# Patient Record
Sex: Female | Born: 1980 | Race: White | Hispanic: No | Marital: Married | Smoking: Former smoker
Health system: Southern US, Community
[De-identification: ages and names within clinical notes are randomized; demographics above are authoritative.]

## PROBLEM LIST (undated history)

## (undated) ENCOUNTER — Inpatient Hospital Stay (HOSPITAL_COMMUNITY): Payer: Self-pay

## (undated) DIAGNOSIS — E785 Hyperlipidemia, unspecified: Secondary | ICD-10-CM

## (undated) DIAGNOSIS — F329 Major depressive disorder, single episode, unspecified: Secondary | ICD-10-CM

## (undated) DIAGNOSIS — B009 Herpesviral infection, unspecified: Secondary | ICD-10-CM

## (undated) DIAGNOSIS — O99345 Other mental disorders complicating the puerperium: Secondary | ICD-10-CM

## (undated) DIAGNOSIS — O26899 Other specified pregnancy related conditions, unspecified trimester: Secondary | ICD-10-CM

## (undated) DIAGNOSIS — T8859XA Other complications of anesthesia, initial encounter: Secondary | ICD-10-CM

## (undated) DIAGNOSIS — N189 Chronic kidney disease, unspecified: Secondary | ICD-10-CM

## (undated) DIAGNOSIS — K219 Gastro-esophageal reflux disease without esophagitis: Secondary | ICD-10-CM

## (undated) DIAGNOSIS — F319 Bipolar disorder, unspecified: Secondary | ICD-10-CM

## (undated) DIAGNOSIS — M5136 Other intervertebral disc degeneration, lumbar region: Secondary | ICD-10-CM

## (undated) DIAGNOSIS — O24419 Gestational diabetes mellitus in pregnancy, unspecified control: Secondary | ICD-10-CM

## (undated) DIAGNOSIS — M419 Scoliosis, unspecified: Secondary | ICD-10-CM

## (undated) DIAGNOSIS — I10 Essential (primary) hypertension: Secondary | ICD-10-CM

## (undated) DIAGNOSIS — N200 Calculus of kidney: Secondary | ICD-10-CM

## (undated) DIAGNOSIS — Z8669 Personal history of other diseases of the nervous system and sense organs: Secondary | ICD-10-CM

## (undated) DIAGNOSIS — C539 Malignant neoplasm of cervix uteri, unspecified: Secondary | ICD-10-CM

## (undated) DIAGNOSIS — M779 Enthesopathy, unspecified: Secondary | ICD-10-CM

## (undated) DIAGNOSIS — M5430 Sciatica, unspecified side: Secondary | ICD-10-CM

## (undated) DIAGNOSIS — N83209 Unspecified ovarian cyst, unspecified side: Secondary | ICD-10-CM

## (undated) DIAGNOSIS — O139 Gestational [pregnancy-induced] hypertension without significant proteinuria, unspecified trimester: Secondary | ICD-10-CM

## (undated) DIAGNOSIS — F419 Anxiety disorder, unspecified: Secondary | ICD-10-CM

## (undated) DIAGNOSIS — R12 Heartburn: Secondary | ICD-10-CM

## (undated) DIAGNOSIS — IMO0002 Reserved for concepts with insufficient information to code with codable children: Secondary | ICD-10-CM

## (undated) DIAGNOSIS — H9190 Unspecified hearing loss, unspecified ear: Secondary | ICD-10-CM

## (undated) DIAGNOSIS — R87619 Unspecified abnormal cytological findings in specimens from cervix uteri: Secondary | ICD-10-CM

## (undated) DIAGNOSIS — F53 Postpartum depression: Secondary | ICD-10-CM

## (undated) DIAGNOSIS — H547 Unspecified visual loss: Secondary | ICD-10-CM

## (undated) DIAGNOSIS — C801 Malignant (primary) neoplasm, unspecified: Secondary | ICD-10-CM

## (undated) DIAGNOSIS — M51369 Other intervertebral disc degeneration, lumbar region without mention of lumbar back pain or lower extremity pain: Secondary | ICD-10-CM

## (undated) DIAGNOSIS — G4700 Insomnia, unspecified: Secondary | ICD-10-CM

## (undated) HISTORY — DX: Calculus of kidney: N20.0

## (undated) HISTORY — DX: Essential (primary) hypertension: I10

## (undated) HISTORY — DX: Postpartum depression: F53.0

## (undated) HISTORY — DX: Gestational diabetes mellitus in pregnancy, unspecified control: O24.419

## (undated) HISTORY — PX: LEEP: SHX91

## (undated) HISTORY — PX: MOUTH SURGERY: SHX715

## (undated) HISTORY — PX: TYMPANOSTOMY TUBE PLACEMENT: SHX32

## (undated) HISTORY — DX: Other mental disorders complicating the puerperium: O99.345

## (undated) HISTORY — DX: Unspecified visual loss: H54.7

---
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
## 2003-01-25 DIAGNOSIS — C539 Malignant neoplasm of cervix uteri, unspecified: Secondary | ICD-10-CM

## 2003-01-25 HISTORY — DX: Malignant neoplasm of cervix uteri, unspecified: C53.9

## 2009-04-09 ENCOUNTER — Emergency Department (HOSPITAL_COMMUNITY): Admission: EM | Admit: 2009-04-09 | Discharge: 2009-04-09 | Payer: Self-pay | Admitting: Emergency Medicine

## 2010-04-19 LAB — POCT PREGNANCY, URINE: Preg Test, Ur: NEGATIVE

## 2010-09-16 ENCOUNTER — Emergency Department (HOSPITAL_COMMUNITY): Payer: Self-pay

## 2010-09-16 ENCOUNTER — Emergency Department (HOSPITAL_COMMUNITY)
Admission: EM | Admit: 2010-09-16 | Discharge: 2010-09-16 | Disposition: A | Discharge status: Home / Self Care | Payer: Self-pay | Attending: Emergency Medicine | Admitting: Emergency Medicine

## 2010-09-16 ENCOUNTER — Encounter: Payer: Self-pay | Admitting: Emergency Medicine

## 2010-09-16 DIAGNOSIS — S8253XA Displaced fracture of medial malleolus of unspecified tibia, initial encounter for closed fracture: Secondary | ICD-10-CM | POA: Insufficient documentation

## 2010-09-16 DIAGNOSIS — S82899A Other fracture of unspecified lower leg, initial encounter for closed fracture: Secondary | ICD-10-CM

## 2010-09-16 DIAGNOSIS — Y92009 Unspecified place in unspecified non-institutional (private) residence as the place of occurrence of the external cause: Secondary | ICD-10-CM | POA: Insufficient documentation

## 2010-09-16 DIAGNOSIS — W172XXA Fall into hole, initial encounter: Secondary | ICD-10-CM | POA: Insufficient documentation

## 2010-09-16 DIAGNOSIS — F17200 Nicotine dependence, unspecified, uncomplicated: Secondary | ICD-10-CM | POA: Insufficient documentation

## 2010-09-16 MED ORDER — HYDROCODONE-ACETAMINOPHEN 5-325 MG PO TABS: ORAL_TABLET | ORAL | Status: DC

## 2010-09-16 NOTE — ED Notes (Signed)
Patient states she stepped into a hole last Saturday and heard a crack and a pop; patient has been wearing ankle brace, but pain is not improving. Complains of increased swelling.

## 2010-09-16 NOTE — ED Notes (Signed)
Transported to xray 

## 2010-09-16 NOTE — ED Provider Notes (Signed)
History     CSN: [id number] Arrival date & time: 09/16/2010  7:23 PM  Chief Complaint  Patient presents with  . Ankle Pain   Patient is a 30 y.o. female presenting with ankle pain. The history is provided by the patient.  Ankle Pain  The incident occurred more than 1 week ago. The incident occurred at home. Injury mechanism: stepped in a hole. The pain is present in the right ankle. The quality of the pain is described as aching. The pain is at a severity of 7/10. The pain is moderate. The pain has been worsening since onset. Associated symptoms include inability to bear weight. Pertinent negatives include no numbness. The symptoms are aggravated by bearing weight. She has tried nothing for the symptoms.    History reviewed. No pertinent past medical history.  Past Surgical History  Procedure Date  . Cesarean section     No family history on file.  History  Substance Use Topics  . Smoking status: Current Everyday Smoker -- 0.5 packs/day    Types: Cigarettes  . Smokeless tobacco: Not on file  . Alcohol Use: Yes     occ    OB History    Grav Para Term Preterm Abortions TAB SAB Ect Mult Living                  Review of Systems  Constitutional: Negative for activity change.       All ROS Neg except as noted in HPI  HENT: Negative for nosebleeds and neck pain.   Eyes: Negative for photophobia and discharge.  Respiratory: Negative for cough, shortness of breath and wheezing.   Cardiovascular: Negative for chest pain and palpitations.  Gastrointestinal: Negative for abdominal pain and blood in stool.  Genitourinary: Negative for dysuria, frequency and hematuria.  Musculoskeletal: Negative for back pain and arthralgias.  Skin: Negative.   Neurological: Negative for dizziness, seizures, speech difficulty and numbness.  Psychiatric/Behavioral: Negative for hallucinations and confusion.    Physical Exam  BP 135/74  Pulse 80  Temp(Src) 98.4 F (36.9 C) (Oral)  Resp 16   Ht 5' 7.5" (1.715 m)  Wt 250 lb (113.399 kg)  BMI 38.58 kg/m2  SpO2 98%  LMP 08/28/2010  Physical Exam  Nursing note and vitals reviewed. Constitutional: She is oriented to person, place, and time. She appears well-developed and well-nourished.  Non-toxic appearance.  HENT:  Head: Normocephalic.  Right Ear: Tympanic membrane and external ear normal.  Left Ear: Tympanic membrane and external ear normal.  Eyes: EOM and lids are normal. Pupils are equal, round, and reactive to light.  Neck: Normal range of motion. Neck supple. Carotid bruit is not present.  Cardiovascular: Normal rate, regular rhythm, normal heart sounds, intact distal pulses and normal pulses.   Pulmonary/Chest: Breath sounds normal. No respiratory distress.  Abdominal: Soft. Bowel sounds are normal. There is no tenderness. There is no guarding.  Musculoskeletal: Normal range of motion.       Pain and swelling medially more than lateral. Good cap refill. Sensory wnl. Achilles intact.  Lymphadenopathy:       Head (right side): No submandibular adenopathy present.       Head (left side): No submandibular adenopathy present.    She has no cervical adenopathy.  Neurological: She is alert and oriented to person, place, and time. She has normal strength. No cranial nerve deficit or sensory deficit.  Skin: Skin is warm and dry.  Psychiatric: She has a normal mood and affect. Her  speech is normal.    ED Course  Procedures  MDM I have reviewed nursing notes, vital signs, and all appropriate lab and imaging results for this patient.      Kathie Dike, [place] 09/16/10 2030

## 2010-09-17 NOTE — ED Provider Notes (Signed)
Medical screening examination/treatment/procedure(s) were performed by non-physician practitioner and as supervising physician I was immediately available for consultation/collaboration.   Lyanne Co, MD 09/17/10 Jacinta Shoe

## 2010-11-28 ENCOUNTER — Encounter (HOSPITAL_COMMUNITY): Payer: Self-pay

## 2010-11-28 ENCOUNTER — Emergency Department (HOSPITAL_COMMUNITY)
Admission: EM | Admit: 2010-11-28 | Discharge: 2010-11-28 | Disposition: A | Discharge status: Home / Self Care | Payer: Self-pay | Attending: Emergency Medicine | Admitting: Emergency Medicine

## 2010-11-28 ENCOUNTER — Emergency Department (HOSPITAL_COMMUNITY): Payer: Self-pay

## 2010-11-28 DIAGNOSIS — M545 Low back pain, unspecified: Secondary | ICD-10-CM | POA: Insufficient documentation

## 2010-11-28 DIAGNOSIS — F17200 Nicotine dependence, unspecified, uncomplicated: Secondary | ICD-10-CM | POA: Insufficient documentation

## 2010-11-28 DIAGNOSIS — M549 Dorsalgia, unspecified: Secondary | ICD-10-CM

## 2010-11-28 LAB — POCT PREGNANCY, URINE: Preg Test, Ur: NEGATIVE

## 2010-11-28 MED ORDER — KETOROLAC TROMETHAMINE 60 MG/2ML IM SOLN
60.0000 mg | Freq: Once | INTRAMUSCULAR | Status: AC
  Administered 2010-11-28: 60 mg via INTRAMUSCULAR
  Filled 2010-11-28: qty 2

## 2010-11-28 MED ORDER — NAPROXEN 500 MG PO TABS: 500.0000 mg | ORAL_TABLET | Freq: Two times a day (BID) | ORAL | Status: DC

## 2010-11-28 MED ORDER — DIAZEPAM 5 MG PO TABS: 5.0000 mg | ORAL_TABLET | Freq: Two times a day (BID) | ORAL | Status: AC

## 2010-11-28 MED ORDER — DIAZEPAM 5 MG PO TABS
5.0000 mg | ORAL_TABLET | Freq: Once | ORAL | Status: AC
  Administered 2010-11-28: 5 mg via ORAL
  Filled 2010-11-28: qty 1

## 2010-11-28 NOTE — ED Provider Notes (Signed)
History     CSN: [id number] Arrival date & time: 11/28/2010  6:50 PM   None     Chief Complaint  Patient presents with  . Back Pain    (Consider location/radiation/quality/duration/timing/severity/associated sxs/prior treatment) HPI Comments: patient c/o lower back pain that began this morning when she woke up.  Describes the pain as throbbing and stabbing at times.  Pain is worsen with movement, standing and palpation.  Pain improves with lying supine.  She denies numbness, weakness, incontinence or feces or urine.    Patient is a 30 y.o. female presenting with back pain. The history is provided by the patient.  Back Pain  This is a new problem. The current episode started 12 to 24 hours ago. The problem occurs constantly. The problem has not changed since onset.The pain is associated with no known injury. The pain is present in the lumbar spine. The quality of the pain is described as shooting and stabbing (throbbing). Radiates to: radiates across her lower back. The pain is at a severity of 10/10. The symptoms are aggravated by bending, twisting and certain positions. The pain is the same all the time. Pertinent negatives include no chest pain, no fever, no numbness, no headaches, no abdominal pain, no abdominal swelling, no bowel incontinence, no perianal numbness, no bladder incontinence, no dysuria, no pelvic pain, no leg pain, no paresthesias, no paresis, no tingling and no weakness. She has tried nothing for the symptoms. The treatment provided no relief.    History reviewed. No pertinent past medical history.  Past Surgical History  Procedure Date  . Cesarean section     History reviewed. No pertinent family history.  History  Substance Use Topics  . Smoking status: Current Everyday Smoker -- 0.5 packs/day    Types: Cigarettes  . Smokeless tobacco: Not on file  . Alcohol Use: Yes     occ    OB History    Grav Para Term Preterm Abortions TAB SAB Ect Mult Living             Review of Systems  Constitutional: Negative for fever, chills and fatigue.  HENT: Negative for sore throat, trouble swallowing, neck pain and neck stiffness.   Respiratory: Negative for cough, shortness of breath and wheezing.   Cardiovascular: Negative for chest pain and palpitations.  Gastrointestinal: Negative for nausea, vomiting, abdominal pain and bowel incontinence.  Genitourinary: Negative for bladder incontinence, dysuria, urgency, hematuria, flank pain, vaginal bleeding, vaginal discharge, difficulty urinating and pelvic pain.  Musculoskeletal: Positive for back pain. Negative for myalgias, joint swelling and arthralgias.  Skin: Negative for rash.  Neurological: Negative for dizziness, tingling, weakness, numbness, headaches and paresthesias.  Hematological: Negative for adenopathy. Does not bruise/bleed easily.  Psychiatric/Behavioral: Negative for confusion.  All other systems reviewed and are negative.    Allergies  Chocolate; Latex; and Morphine and related  Home Medications   Current Outpatient Rx  Name Route Sig Dispense Refill  . HYDROCODONE-ACETAMINOPHEN 5-325 MG PO TABS  1 or 2 po q4h prn pain 20 tablet 0  . LEVONORGESTREL 20 MCG/24HR IU IUD Intrauterine 1 each by Intrauterine route once.        BP 137/83  Pulse 76  Temp(Src) 98.2 F (36.8 C) (Oral)  Resp 20  Ht 5' 7.5" (1.715 m)  Wt 250 lb (113.399 kg)  BMI 38.58 kg/m2  SpO2 97%  Physical Exam  Nursing note and vitals reviewed. Constitutional: She is oriented to person, place, and time. She appears well-developed and well-nourished.  No distress.       uncomfortable appearing  HENT:  Head: Normocephalic and atraumatic.  Mouth/Throat: Oropharynx is clear and moist.  Neck: Normal range of motion. Neck supple.  Cardiovascular: Normal rate, regular rhythm and normal heart sounds.   Pulmonary/Chest: Effort normal and breath sounds normal. No respiratory distress. She exhibits no tenderness.    Abdominal: Soft. She exhibits no distension. There is no tenderness.  Musculoskeletal: Normal range of motion. She exhibits no tenderness.  Lymphadenopathy:    She has no cervical adenopathy.  Neurological: She is alert and oriented to person, place, and time. No cranial nerve deficit or sensory deficit. She exhibits normal muscle tone. Coordination and gait normal.  Reflex Scores:      Patellar reflexes are 2+ on the right side and 2+ on the left side.      Achilles reflexes are 2+ on the right side and 2+ on the left side. Skin: Skin is warm and dry.  Psychiatric: She has a normal mood and affect.    ED Course  Procedures (including critical care time)  Results for orders placed during the hospital encounter of 11/28/10  POCT PREGNANCY, URINE      Component Value Range   Preg Test, Ur NEGATIVE        Dg Lumbar Spine Complete  11/28/2010  *RADIOLOGY REPORT*  Clinical Data: Pain for 1 day.  No known injury.  LUMBAR SPINE - COMPLETE 4+ VIEW  Comparison: None.  Findings: There are five lumbar type vertebral bodies.  The alignment is normal.  The disc spaces are preserved.  There is no evidence of acute fracture or pars defect.  IMPRESSION: No acute osseous findings or malalignment.  Original Report Authenticated By: Gerrianne Scale, M.D.     MDM    8:15 PM patient ambulated to the x-ray dept w/o assistance.  Feeling better after injection.  No focal neuro deficits on exam.  ttp of the lumbar paraspinal muscles.  Likley muscle spasm.    Pt feels improved after observation and/or treatment in ED.   Patient / Family / Caregiver understand and agree with initial ED impression and plan with expectations set for ED visit.         Dhanush Jokerst L. [place], [place] 11/28/10 2112

## 2010-11-28 NOTE — ED Notes (Signed)
Pt states woke this morning with lower and mid back pain. Pt was noted to be sitting on edge of stretcher with boyfriend standing in front of her while she was hanging on his belt loops and head resting on his lower abdomin. Pt very agitated and histrionic while boyfriend in the room. Pt refers to her back pain worse than having natural child birth.

## 2010-11-28 NOTE — ED Notes (Signed)
Pt presents with low back pain starting this AM. Pt states pain is throbbing and stabbing. Pt denies trauma or injury.

## 2010-11-29 NOTE — ED Provider Notes (Signed)
Medical screening examination/treatment/procedure(s) were performed by non-physician practitioner and as supervising physician I was immediately available for consultation/collaboration.   Brannen Koppen, MD 11/29/10 0028 

## 2011-03-01 DIAGNOSIS — N390 Urinary tract infection, site not specified: Secondary | ICD-10-CM | POA: Insufficient documentation

## 2011-03-01 DIAGNOSIS — F17200 Nicotine dependence, unspecified, uncomplicated: Secondary | ICD-10-CM | POA: Insufficient documentation

## 2011-03-01 DIAGNOSIS — IMO0001 Reserved for inherently not codable concepts without codable children: Secondary | ICD-10-CM | POA: Insufficient documentation

## 2011-03-02 ENCOUNTER — Encounter (HOSPITAL_COMMUNITY): Payer: Self-pay

## 2011-03-02 ENCOUNTER — Emergency Department (HOSPITAL_COMMUNITY)
Admission: EM | Admit: 2011-03-02 | Discharge: 2011-03-02 | Disposition: A | Discharge status: Home / Self Care | Payer: Medicaid Other | Attending: Emergency Medicine | Admitting: Emergency Medicine

## 2011-03-02 DIAGNOSIS — N390 Urinary tract infection, site not specified: Secondary | ICD-10-CM

## 2011-03-02 LAB — BASIC METABOLIC PANEL
BUN: 10 mg/dL (ref 6–23)
CO2: 28 mEq/L (ref 19–32)
Calcium: 9.7 mg/dL (ref 8.4–10.5)
Chloride: 103 mEq/L (ref 96–112)
Creatinine, Ser: 0.72 mg/dL (ref 0.50–1.10)
GFR calc Af Amer: >90 mL/min (ref >90)
GFR calc non Af Amer: >90 mL/min (ref >90)
Glucose, Bld: 104 mg/dL — ABNORMAL HIGH (ref 70–99)
Potassium: 3.3 mEq/L — ABNORMAL LOW (ref 3.5–5.1)
Sodium: 139 mEq/L (ref 135–145)

## 2011-03-02 LAB — CBC
HCT: 44.2 % (ref 36.0–46.0)
Hemoglobin: 15.1 g/dL — ABNORMAL HIGH (ref 12.0–15.0)
MCH: 30.9 pg (ref 26.0–34.0)
MCHC: 34.2 g/dL (ref 30.0–36.0)
MCV: 90.4 fL (ref 78.0–100.0)
Platelets: 189 10*3/uL (ref 150–400)
RBC: 4.89 MIL/uL (ref 3.87–5.11)
RDW: 12.6 % (ref 11.5–15.5)
WBC: 11 10*3/uL — ABNORMAL HIGH (ref 4.0–10.5)

## 2011-03-02 LAB — URINALYSIS, ROUTINE W REFLEX MICROSCOPIC
Bilirubin Urine: NEGATIVE
Glucose, UA: NEGATIVE mg/dL
Hgb urine dipstick: NEGATIVE
Ketones, ur: 15 mg/dL — AB
Leukocytes, UA: NEGATIVE
Nitrite: POSITIVE — AB
Protein, ur: NEGATIVE mg/dL
Specific Gravity, Urine: >1.03 — ABNORMAL HIGH (ref 1.005–1.030)
Urobilinogen, UA: 0.2 mg/dL (ref 0.0–1.0)
pH: 6 (ref 5.0–8.0)

## 2011-03-02 LAB — PREGNANCY, URINE: Preg Test, Ur: NEGATIVE

## 2011-03-02 LAB — URINE MICROSCOPIC-ADD ON

## 2011-03-02 MED ORDER — LIDOCAINE HCL (PF) 1 % IJ SOLN
INTRAMUSCULAR | Status: AC
  Filled 2011-03-02: qty 5

## 2011-03-02 MED ORDER — ONDANSETRON HCL 4 MG PO TABS: 4.0000 mg | ORAL_TABLET | Freq: Four times a day (QID) | ORAL | Status: AC

## 2011-03-02 MED ORDER — ACETAMINOPHEN 325 MG PO TABS
650.0000 mg | ORAL_TABLET | Freq: Once | ORAL | Status: AC
  Administered 2011-03-02: 650 mg via ORAL
  Filled 2011-03-02: qty 2

## 2011-03-02 MED ORDER — CEFTRIAXONE SODIUM 1 G IJ SOLR
1.0000 g | Freq: Once | INTRAMUSCULAR | Status: AC
  Administered 2011-03-02: 1 g via INTRAMUSCULAR
  Filled 2011-03-02: qty 10

## 2011-03-02 MED ORDER — IBUPROFEN 800 MG PO TABS
800.0000 mg | ORAL_TABLET | Freq: Once | ORAL | Status: AC
  Administered 2011-03-02: 800 mg via ORAL
  Filled 2011-03-02: qty 1

## 2011-03-02 MED ORDER — POTASSIUM CHLORIDE CRYS ER 20 MEQ PO TBCR
20.0000 meq | EXTENDED_RELEASE_TABLET | Freq: Once | ORAL | Status: AC
  Administered 2011-03-02: 20 meq via ORAL
  Filled 2011-03-02: qty 1

## 2011-03-02 MED ORDER — CEPHALEXIN 500 MG PO CAPS: 500.0000 mg | ORAL_CAPSULE | Freq: Four times a day (QID) | ORAL | Status: AC

## 2011-03-02 NOTE — ED Notes (Signed)
Pt advised to wait for signs and symptoms of allergic reaction from medication administration. Pt verbalized understanding.

## 2011-03-02 NOTE — ED Notes (Signed)
No s/s of allergic reaction noted.

## 2011-03-02 NOTE — ED Notes (Signed)
Pt arrived to ER with complaints of generalized body aches that started when she woke up at 8pm tonight along with slurred speech per pt. Speech intact at this time. Ambulated to triage without difficulty. resp even/nonlabored. nad noted at this time.

## 2011-03-02 NOTE — ED Notes (Addendum)
Pt states that she woke up at 8 pm with generalized body aches and slurred speech. Pt reports that speech became better after drinking water however reports that body aches still remain. Pt requests to have a pregnancy test stating that last menstrual period was in December. Pt denies any fever.

## 2011-03-02 NOTE — ED Provider Notes (Signed)
History     CSN: [id number]  Arrival date & time 03/01/11  2341   First MD Initiated Contact with Patient 03/02/11 [phone]      Chief Complaint  Patient presents with  . Generalized Body Aches  . Aphasia    per pt    (Consider location/radiation/quality/duration/timing/severity/associated sxs/prior treatment) The history is provided by the patient.  patient complaining of general body aches and chills and not feeling well tonight. No measured fever. No nausea or vomiting or diarrhea. No abdominal pain. No back pain. She has some dysuria and frequency but no hematuria. 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 No cough or difficulty breathing.no rash or recent travel. No leg pain or swelling. Location all over. No radiation. Quality described as aches. Constant since onset tonight. No history of same.  History reviewed. No pertinent past medical history.  Past Surgical History  Procedure Date  . Cesarean section     No family history on file.  History  Substance Use Topics  . Smoking status: Current Everyday Smoker -- 0.5 packs/day    Types: Cigarettes  . Smokeless tobacco: Not on file  . Alcohol Use: Yes     occ    OB History    Grav Para Term Preterm Abortions TAB SAB Ect Mult Living                  Review of Systems  Constitutional: Positive for chills. Negative for fever.  HENT: Negative for neck pain and neck stiffness.   Eyes: Negative for pain.  Respiratory: Negative for shortness of breath.   Cardiovascular: Negative for chest pain, palpitations and leg swelling.  Gastrointestinal: Negative for abdominal pain.  Genitourinary: Negative for flank pain, vaginal bleeding and vaginal discharge.  Musculoskeletal: Negative for back pain.  Skin: Negative for rash.  Neurological: Negative for headaches.  All other systems reviewed and are negative.    Allergies  Chocolate; Latex; and Morphine and related  Home Medications   Current Outpatient  Rx  Name Route Sig Dispense Refill  . HYDROCODONE-ACETAMINOPHEN 5-325 MG PO TABS  1 or 2 po q4h prn pain 20 tablet 0  . LEVONORGESTREL 20 MCG/24HR IU IUD Intrauterine 1 each by Intrauterine route once.      Marland Kitchen NAPROXEN 500 MG PO TABS Oral Take 1 tablet (500 mg total) by mouth 2 (two) times daily. Prn pain 30 tablet 0    BP 126/88  Pulse 69  Temp(Src) 97.9 F (36.6 C) (Oral)  Resp 16  Ht 5\' 7"  (1.702 m)  Wt 250 lb (113.399 kg)  BMI 39.16 kg/m2  SpO2 100%  LMP 12/26/2010  Physical Exam  Constitutional: She is oriented to person, place, and time. She appears well-developed and well-nourished.  HENT:  Head: Normocephalic and atraumatic.  Eyes: Conjunctivae and EOM are normal. Pupils are equal, round, and reactive to light.  Neck: Trachea normal. Neck supple. No thyromegaly present.  Cardiovascular: Normal rate, regular rhythm, S1 normal, S2 normal and normal pulses.     No systolic murmur is present   No diastolic murmur is present  Pulses:      Radial pulses are 2+ on the right side, and 2+ on the left side.  Pulmonary/Chest: Effort normal and breath sounds normal. She has no wheezes. She has no rhonchi. She has no rales. She exhibits no tenderness.  Abdominal: Soft. Normal appearance and bowel sounds are normal. There is no tenderness. There is no CVA tenderness and negative  Murphy's sign.  Musculoskeletal:       BLE:s Calves nontender, no cords or erythema, negative Homans sign  Neurological: She is alert and oriented to person, place, and time. She has normal strength. No cranial nerve deficit or sensory deficit. GCS eye subscore is 4. GCS verbal subscore is 5. GCS motor subscore is 6.  Skin: Skin is warm and dry. No rash noted. She is not diaphoretic.  Psychiatric: Her speech is normal.       Cooperative and appropriate    ED Course  Procedures (including critical care time)  Labs Reviewed  CBC - Abnormal; Notable for the following:    WBC 11.0 (*)    Hemoglobin 15.1 (*)     All other components within normal limits  BASIC METABOLIC PANEL - Abnormal; Notable for the following:    Potassium 3.3 (*)    Glucose, Bld 104 (*)    All other components within normal limits  URINALYSIS, ROUTINE W REFLEX MICROSCOPIC - Abnormal; Notable for the following:    APPearance HAZY (*)    Specific Gravity, Urine >1.030 (*)    Ketones, ur 15 (*)    Nitrite POSITIVE (*)    All other components within normal limits  URINE MICROSCOPIC-ADD ON - Abnormal; Notable for the following:    Squamous Epithelial / LPF FEW (*)    Bacteria, UA MANY (*)    All other components within normal limits  PREGNANCY, URINE    IM Rocephin provided with Tylenol Motrin. Recheck at 3:02 AM is feeling better and stable for discharge home  MDM   Body aches with UTI as above. Treated for same. Urine culture sent and antibiotics prescription provided with discharge and followup instructions verbalized is understood. Return precautions for fever vomiting or any worsening condition.         Sunnie Nielsen, MD 03/02/11 [phone]

## 2011-03-02 NOTE — ED Notes (Signed)
MD at bedside. 

## 2011-03-20 ENCOUNTER — Emergency Department (HOSPITAL_COMMUNITY)
Admission: EM | Admit: 2011-03-20 | Discharge: 2011-03-20 | Disposition: A | Discharge status: Home / Self Care | Payer: Worker's Compensation | Attending: Emergency Medicine | Admitting: Emergency Medicine

## 2011-03-20 ENCOUNTER — Encounter (HOSPITAL_COMMUNITY): Payer: Self-pay

## 2011-03-20 ENCOUNTER — Emergency Department (HOSPITAL_COMMUNITY): Payer: Worker's Compensation

## 2011-03-20 DIAGNOSIS — X500XXA Overexertion from strenuous movement or load, initial encounter: Secondary | ICD-10-CM | POA: Insufficient documentation

## 2011-03-20 DIAGNOSIS — S46912A Strain of unspecified muscle, fascia and tendon at shoulder and upper arm level, left arm, initial encounter: Secondary | ICD-10-CM

## 2011-03-20 DIAGNOSIS — Y9210 Unspecified residential institution as the place of occurrence of the external cause: Secondary | ICD-10-CM | POA: Insufficient documentation

## 2011-03-20 DIAGNOSIS — F17200 Nicotine dependence, unspecified, uncomplicated: Secondary | ICD-10-CM | POA: Insufficient documentation

## 2011-03-20 DIAGNOSIS — IMO0002 Reserved for concepts with insufficient information to code with codable children: Secondary | ICD-10-CM | POA: Insufficient documentation

## 2011-03-20 MED ORDER — IBUPROFEN 600 MG PO TABS: 600.0000 mg | ORAL_TABLET | Freq: Four times a day (QID) | ORAL | Status: AC | PRN

## 2011-03-20 MED ORDER — CYCLOBENZAPRINE HCL 10 MG PO TABS: 10.0000 mg | ORAL_TABLET | Freq: Two times a day (BID) | ORAL | Status: AC | PRN

## 2011-03-20 MED ORDER — IBUPROFEN 800 MG PO TABS
800.0000 mg | ORAL_TABLET | Freq: Once | ORAL | Status: AC
  Administered 2011-03-20: 800 mg via ORAL
  Filled 2011-03-20: qty 1

## 2011-03-20 NOTE — ED Notes (Signed)
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  As a result the pt's left arm became pinned under the said resident, pt called out for assistance and was inevitably forced to pull arm out from under the resident. Pt's left shoulder has no visible deformity, however she states it is very painful to move. Ice applied at this time.

## 2011-03-20 NOTE — ED Notes (Signed)
Pt reports was rolling a resident in bed and hurt left shoulder.

## 2011-03-20 NOTE — ED Provider Notes (Signed)
History     CSN: [id number]  Arrival date & time 03/20/11  1321   First MD Initiated Contact with Patient 03/20/11 1332      Chief Complaint  Patient presents with  . 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  Prior to arrival she was attempting to roll a resident toward her,  When the resident pushed away,  With our pateints arm caught under the resident,  Causing a stretching of her left shoulder through elbow.  Patient is a 31 y.o. female presenting with shoulder injury. The history is provided by the patient.  Shoulder Injury This is a new problem. The current episode started today. The problem occurs constantly. The problem has been unchanged. Associated symptoms include arthralgias. Pertinent negatives include no abdominal pain, chest pain, congestion, fever, headaches, joint swelling, nausea, neck pain, numbness, rash, sore throat or weakness. Exacerbated by: Palpation and moving the shoulder and elbow make pain worse. She has tried nothing for the symptoms.    History reviewed. No pertinent past medical history.  Past Surgical History  Procedure Date  . Cesarean section     No family history on file.  History  Substance Use Topics  . Smoking status: Current Everyday Smoker -- 0.5 packs/day    Types: Cigarettes  . Smokeless tobacco: Not on file  . Alcohol Use: Yes     occ    OB History    Grav Para Term Preterm Abortions TAB SAB Ect Mult Living                  Review of Systems  Constitutional: Negative for fever.  HENT: Negative for congestion, sore throat and neck pain.   Eyes: Negative.   Respiratory: Negative for chest tightness and shortness of breath.   Cardiovascular: Negative for chest pain.  Gastrointestinal: Negative for nausea and abdominal pain.  Genitourinary: Negative.   Musculoskeletal: Positive for  arthralgias. Negative for joint swelling.  Skin: Negative.  Negative for rash and wound.  Neurological: Negative for dizziness, weakness, light-headedness, numbness and headaches.  Hematological: Negative.   Psychiatric/Behavioral: Negative.     Allergies  Chocolate; Latex; and Morphine and related  Home Medications   Current Outpatient Rx  Name Route Sig Dispense Refill  . CYCLOBENZAPRINE HCL 10 MG PO TABS Oral Take 1 tablet (10 mg total) by mouth 2 (two) times daily as needed for muscle spasms. 20 tablet 0  . HYDROCODONE-ACETAMINOPHEN 5-325 MG PO TABS  1 or 2 po q4h prn pain 20 tablet 0  . IBUPROFEN 600 MG PO TABS Oral Take 1 tablet (600 mg total) by mouth every 6 (six) hours as needed for pain. 20 tablet 0  . LEVONORGESTREL 20 MCG/24HR IU IUD Intrauterine 1 each by Intrauterine route once.      Marland Kitchen NAPROXEN 500 MG PO TABS Oral Take 1 tablet (500 mg total) by mouth 2 (two) times daily. Prn pain 30 tablet 0    BP 136/82  Pulse 88  Temp(Src) 98.8 F (37.1 C) (Oral)  Resp 20  Ht 5' 7.5" (1.715 m)  Wt 250 lb (113.399 kg)  BMI 38.58 kg/m2  SpO2 100%  LMP 03/06/2011  Physical Exam  Nursing note and vitals reviewed. Constitutional: She is oriented to person, place, and time. She appears well-developed and well-nourished.  HENT:  Head: Normocephalic.  Eyes: Conjunctivae are normal.  Neck: Normal range of motion.  Cardiovascular: Normal rate and  intact distal pulses.  Exam reveals no decreased pulses.   Pulses:      Radial pulses are 2+ on the right side, and 2+ on the left side.  Pulmonary/Chest: Effort normal.  Musculoskeletal: She exhibits edema and tenderness.       Left shoulder: She exhibits decreased range of motion, tenderness, pain and spasm. She exhibits no swelling, no crepitus, no deformity, normal pulse and normal strength.       Arms: Neurological: She is alert and oriented to person, place, and time. No sensory deficit.  Skin: Skin is warm, dry and intact.     ED Course  Procedures (including critical care time)  Labs Reviewed - No data to display Dg Elbow Complete Left  03/20/2011  *RADIOLOGY REPORT*  Clinical Data: Elbow pain  LEFT ELBOW - COMPLETE 3+ VIEW  Comparison: None.  Findings: No acute fracture and no dislocation.  Unremarkable soft tissues.  IMPRESSION: No acute bony pathology.  Original Report Authenticated By: Donavan Burnet, M.D.   Dg Shoulder Left  03/20/2011  *RADIOLOGY REPORT*  Clinical Data: Crush injury  LEFT SHOULDER - 2+ VIEW  Comparison: None.  Findings: No acute fracture and no dislocation.  Unremarkable soft tissues.  IMPRESSION: No acute bony pathology.  Original Report Authenticated By: Donavan Burnet, M.D.     1. Left shoulder strain       MDM  Patient supplied with sling for comfort,  Flexeril,  Hydrocodone and ibuprofen.  Ice recommended for the next 24 hours.  Referral to Dr. Romeo Apple prn,  But reassurance given time may resolve her injury with no need for further intervention.        Candis Musa, PA 03/20/11 2154  Candis Musa, PA 03/20/11 2155

## 2011-03-20 NOTE — Discharge Instructions (Signed)
Joint Sprain A sprain is a tear or stretch in the ligaments that hold a joint together. Severe sprains may need as long as 3-6 weeks of immobilization and/or exercises to heal completely. Sprained joints should be rested and protected. If not, they can become unstable and prone to re-injury. Proper treatment can reduce your pain, shorten the period of disability, and reduce the risk of repeated injuries. TREATMENT   Rest and elevate the injured joint to reduce pain and swelling.   Apply ice packs to the injury for 20-30 minutes every 2-3 hours for the next 2-3 days.   Keep the injury wrapped in a compression bandage or splint as long as the joint is painful or as instructed by your caregiver.   Do not use the injured joint until it is completely healed to prevent re-injury and chronic instability. Follow the instructions of your caregiver.   Long-term sprain management may require exercises and/or treatment by a physical therapist. Taping or special braces may help stabilize the joint until it is completely better.  SEEK MEDICAL CARE IF:   You develop increased pain or swelling of the joint.   You develop increasing redness and warmth of the joint.   You develop a fever.   It becomes stiff.   Your hand or foot gets cold or numb.  Document Released: 02/18/2004 Document Revised: 09/22/2010 Document Reviewed: 01/28/2008 [facility] Patient Information 2012 [place], [place].   Use the medicines prescribed along with an ice pack 10 minutes every hour for the next day.  Wear the sling for comfort.  Expect gradual relief, but get rechecked if not improving over the next week.

## 2011-03-21 NOTE — ED Provider Notes (Signed)
Medical screening examination/treatment/procedure(s) were performed by non-physician practitioner and as supervising physician I was immediately available for consultation/collaboration.   Michol Emory, MD 03/21/11 0758 

## 2011-04-29 ENCOUNTER — Encounter (HOSPITAL_COMMUNITY): Payer: Self-pay | Admitting: *Deleted

## 2011-04-29 ENCOUNTER — Emergency Department (HOSPITAL_COMMUNITY)
Admission: EM | Admit: 2011-04-29 | Discharge: 2011-04-29 | Disposition: A | Discharge status: Home / Self Care | Payer: Medicaid Other | Attending: Emergency Medicine | Admitting: Emergency Medicine

## 2011-04-29 DIAGNOSIS — R110 Nausea: Secondary | ICD-10-CM | POA: Insufficient documentation

## 2011-04-29 DIAGNOSIS — Z886 Allergy status to analgesic agent status: Secondary | ICD-10-CM | POA: Insufficient documentation

## 2011-04-29 DIAGNOSIS — F17200 Nicotine dependence, unspecified, uncomplicated: Secondary | ICD-10-CM | POA: Insufficient documentation

## 2011-04-29 DIAGNOSIS — R42 Dizziness and giddiness: Secondary | ICD-10-CM | POA: Insufficient documentation

## 2011-04-29 DIAGNOSIS — R5381 Other malaise: Secondary | ICD-10-CM | POA: Insufficient documentation

## 2011-04-29 LAB — URINALYSIS, ROUTINE W REFLEX MICROSCOPIC
Bilirubin Urine: NEGATIVE
Glucose, UA: NEGATIVE mg/dL
Hgb urine dipstick: NEGATIVE
Ketones, ur: NEGATIVE mg/dL
Nitrite: NEGATIVE
Protein, ur: NEGATIVE mg/dL
Specific Gravity, Urine: 1.011 (ref 1.005–1.030)
Urobilinogen, UA: 0.2 mg/dL (ref 0.0–1.0)
pH: 7 (ref 5.0–8.0)

## 2011-04-29 LAB — URINE MICROSCOPIC-ADD ON

## 2011-04-29 LAB — HCG, QUANTITATIVE, PREGNANCY: hCG, Beta Chain, Quant, S: <1 m[IU]/mL (ref <5)

## 2011-04-29 LAB — POCT PREGNANCY, URINE: Preg Test, Ur: NEGATIVE

## 2011-04-29 MED ORDER — ONDANSETRON HCL 4 MG PO TABS: 4.0000 mg | ORAL_TABLET | Freq: Four times a day (QID) | ORAL | Status: AC

## 2011-04-29 MED ORDER — ONDANSETRON 4 MG PO TBDP
4.0000 mg | ORAL_TABLET | Freq: Once | ORAL | Status: AC
  Administered 2011-04-29: 4 mg via ORAL
  Filled 2011-04-29: qty 1

## 2011-04-29 NOTE — ED Provider Notes (Signed)
History     CSN: [id number]  Arrival date & time 04/29/11  1355   First MD Initiated Contact with Patient 04/29/11 1449      Chief Complaint  Patient presents with  . Nausea    (Consider location/radiation/quality/duration/timing/severity/associated sxs/prior treatment) HPI Patient states that she is having nausea. She states she is having symptoms consistent with pregnancy. She states that her hCGs are always low and are nondetectable urine test. She states that she is rh negative.  She states she has felt weak and lightheaded. History reviewed. No pertinent past medical history.  Past Surgical History  Procedure Date  . Cesarean section     History reviewed. No pertinent family history.  History  Substance Use Topics  . Smoking status: Current Everyday Smoker -- 0.5 packs/day    Types: Cigarettes  . Smokeless tobacco: Not on file  . Alcohol Use: Yes     occ    OB History    Grav Para Term Preterm Abortions TAB SAB Ect Mult Living                  Review of Systems  All other systems reviewed and are negative.    Allergies  Chocolate; Latex; and Morphine and related  Home Medications  No current outpatient prescriptions on file.  BP 137/86  Pulse 82  Temp(Src) 98.5 F (36.9 C) (Oral)  Resp 16  SpO2 97%  LMP 03/23/2011  Physical Exam  Nursing note and vitals reviewed. Constitutional: She appears well-developed and well-nourished.  HENT:  Head: Normocephalic and atraumatic.  Eyes: Conjunctivae and EOM are normal. Pupils are equal, round, and reactive to light.  Neck: Normal range of motion. Neck supple.  Cardiovascular: Normal rate, regular rhythm, normal heart sounds and intact distal pulses.   Pulmonary/Chest: Effort normal and breath sounds normal.  Abdominal: Soft. Bowel sounds are normal.  Musculoskeletal: Normal range of motion.  Neurological: She is alert.  Skin: Skin is warm and dry.  Psychiatric: She has a normal mood and affect. Thought  content normal.    ED Course  Procedures (including critical care time)  Labs Reviewed  URINALYSIS, ROUTINE W REFLEX MICROSCOPIC - Abnormal; Notable for the following:    APPearance CLOUDY (*)    Leukocytes, UA SMALL (*)    All other components within normal limits  URINE MICROSCOPIC-ADD ON - Abnormal; Notable for the following:    Squamous Epithelial / LPF MANY (*)    Bacteria, UA FEW (*)    All other components within normal limits  POCT PREGNANCY, URINE  HCG, QUANTITATIVE, PREGNANCY   No results found.   No diagnosis found.    MDM         Hilario Quarry, MD 04/29/11 [phone]

## 2011-04-29 NOTE — ED Notes (Signed)
To ED for eval of nausea for the past 2 weeks. States she felt same when pregnant. States she can't take home pregnancy test due to 'my HCG levels are never high enough'. Requesting blood test.

## 2011-04-29 NOTE — Discharge Instructions (Signed)
Nausea and Vomiting  Nausea is a sick feeling that often comes before throwing up (vomiting). Vomiting is a reflex where stomach contents come out of your mouth. Vomiting can cause severe loss of body fluids (dehydration). Children and elderly adults can become dehydrated quickly, especially if they also have diarrhea. Nausea and vomiting are symptoms of a condition or disease. It is important to find the cause of your symptoms.  CAUSES    Direct irritation of the stomach lining. This irritation can result from increased acid production (gastroesophageal reflux disease), infection, food poisoning, taking certain medicines (such as nonsteroidal anti-inflammatory drugs), alcohol use, or tobacco use.   Signals from the brain.These signals could be caused by a headache, heat exposure, an inner ear disturbance, increased pressure in the brain from injury, infection, a tumor, or a concussion, pain, emotional stimulus, or metabolic problems.   An obstruction in the gastrointestinal tract (bowel obstruction).   Illnesses such as diabetes, hepatitis, gallbladder problems, appendicitis, kidney problems, cancer, sepsis, atypical symptoms of a heart attack, or eating disorders.   Medical treatments such as chemotherapy and radiation.   Receiving medicine that makes you sleep (general anesthetic) during surgery.  DIAGNOSIS  Your caregiver may ask for tests to be done if the problems do not improve after a few days. Tests may also be done if symptoms are severe or if the reason for the nausea and vomiting is not clear. Tests may include:   Urine tests.   Blood tests.   Stool tests.   Cultures (to look for evidence of infection).   X-rays or other imaging studies.  Test results can help your caregiver make decisions about treatment or the need for additional tests.  TREATMENT  You need to stay well hydrated. Drink frequently but in small amounts.You may wish to drink water, sports drinks, clear broth, or eat frozen  ice pops or gelatin dessert to help stay hydrated.When you eat, eating slowly may help prevent nausea.There are also some antinausea medicines that may help prevent nausea.  HOME CARE INSTRUCTIONS    Take all medicine as directed by your caregiver.   If you do not have an appetite, do not force yourself to eat. However, you must continue to drink fluids.   If you have an appetite, eat a normal diet unless your caregiver tells you differently.   Eat a variety of complex carbohydrates (rice, wheat, potatoes, bread), lean meats, yogurt, fruits, and vegetables.   Avoid high-fat foods because they are more difficult to digest.   Drink enough water and fluids to keep your urine clear or pale yellow.   If you are dehydrated, ask your caregiver for specific rehydration instructions. Signs of dehydration may include:   Severe thirst.   Dry lips and mouth.   Dizziness.   Dark urine.   Decreasing urine frequency and amount.   Confusion.   Rapid breathing or pulse.  SEEK IMMEDIATE MEDICAL CARE IF:    You have blood or brown flecks (like coffee grounds) in your vomit.   You have black or bloody stools.   You have a severe headache or stiff neck.   You are confused.   You have severe abdominal pain.   You have chest pain or trouble breathing.   You do not urinate at least once every 8 hours.   You develop cold or clammy skin.   You continue to vomit for longer than 24 to 48 hours.   You have a fever.  MAKE SURE YOU:      Understand these instructions.   Will watch your condition.   Will get help right away if you are not doing well or get worse.  Document Released: 01/10/2005 Document Revised: 12/30/2010 Document Reviewed: 06/09/2010  [employer] Patient Information 2012 [employer].

## 2011-04-29 NOTE — ED Notes (Signed)
Pt. States that she was standing in the living room with her boyfriend when she suddenly felt nauseous, dizzy, and started sweating. Reports it passed as soon as she sat down and fanned off.  Denies vomiting, diarrhea, or fevers. States possibility of pregnancy and wants a blood test.

## 2011-09-28 ENCOUNTER — Other Ambulatory Visit (HOSPITAL_COMMUNITY): Payer: Self-pay | Admitting: Physician Assistant

## 2011-09-28 DIAGNOSIS — Z3682 Encounter for antenatal screening for nuchal translucency: Secondary | ICD-10-CM

## 2011-09-28 LAB — OB RESULTS CONSOLE HEPATITIS B SURFACE ANTIGEN: Hepatitis B Surface Ag: NEGATIVE

## 2011-09-28 LAB — OB RESULTS CONSOLE ABO/RH: RH Type: NEGATIVE

## 2011-09-28 LAB — OB RESULTS CONSOLE RPR: RPR: NONREACTIVE

## 2011-09-28 LAB — OB RESULTS CONSOLE HIV ANTIBODY (ROUTINE TESTING): HIV: NONREACTIVE

## 2011-09-28 LAB — OB RESULTS CONSOLE RUBELLA ANTIBODY, IGM: Rubella: IMMUNE

## 2011-09-28 LAB — OB RESULTS CONSOLE ANTIBODY SCREEN: Antibody Screen: NEGATIVE

## 2011-09-28 LAB — OB RESULTS CONSOLE GC/CHLAMYDIA
Chlamydia: NEGATIVE
Gonorrhea: NEGATIVE

## 2011-10-05 ENCOUNTER — Other Ambulatory Visit (HOSPITAL_COMMUNITY): Payer: Self-pay | Admitting: Physician Assistant

## 2011-10-05 ENCOUNTER — Ambulatory Visit (HOSPITAL_COMMUNITY)
Admission: RE | Admit: 2011-10-05 | Discharge: 2011-10-05 | Disposition: A | Discharge status: Home / Self Care | Payer: Medicaid Other | Source: Ambulatory Visit | Attending: Physician Assistant | Admitting: Physician Assistant

## 2011-10-05 ENCOUNTER — Ambulatory Visit (HOSPITAL_COMMUNITY): Admission: RE | Admit: 2011-10-05 | Payer: Medicaid Other | Source: Ambulatory Visit

## 2011-10-05 ENCOUNTER — Encounter (HOSPITAL_COMMUNITY): Payer: Self-pay

## 2011-10-05 DIAGNOSIS — O3510X0 Maternal care for (suspected) chromosomal abnormality in fetus, unspecified, not applicable or unspecified: Secondary | ICD-10-CM | POA: Insufficient documentation

## 2011-10-05 DIAGNOSIS — Z3682 Encounter for antenatal screening for nuchal translucency: Secondary | ICD-10-CM

## 2011-10-05 DIAGNOSIS — O34219 Maternal care for unspecified type scar from previous cesarean delivery: Secondary | ICD-10-CM | POA: Insufficient documentation

## 2011-10-05 DIAGNOSIS — Z3689 Encounter for other specified antenatal screening: Secondary | ICD-10-CM | POA: Insufficient documentation

## 2011-10-05 DIAGNOSIS — O351XX0 Maternal care for (suspected) chromosomal abnormality in fetus, not applicable or unspecified: Secondary | ICD-10-CM | POA: Insufficient documentation

## 2011-10-05 DIAGNOSIS — O09299 Supervision of pregnancy with other poor reproductive or obstetric history, unspecified trimester: Secondary | ICD-10-CM | POA: Insufficient documentation

## 2011-10-05 NOTE — Progress Notes (Signed)
[name] was seen for ultrasound appointment today.  Please see [facility] report for details.

## 2011-10-10 ENCOUNTER — Other Ambulatory Visit (HOSPITAL_COMMUNITY): Payer: Self-pay | Admitting: Physician Assistant

## 2011-10-10 DIAGNOSIS — Z3682 Encounter for antenatal screening for nuchal translucency: Secondary | ICD-10-CM

## 2011-10-11 ENCOUNTER — Ambulatory Visit (HOSPITAL_COMMUNITY)
Admission: RE | Admit: 2011-10-11 | Discharge: 2011-10-11 | Disposition: A | Discharge status: Home / Self Care | Payer: Medicaid Other | Source: Ambulatory Visit | Attending: Physician Assistant | Admitting: Physician Assistant

## 2011-10-11 ENCOUNTER — Other Ambulatory Visit: Payer: Self-pay

## 2011-10-11 DIAGNOSIS — O34219 Maternal care for unspecified type scar from previous cesarean delivery: Secondary | ICD-10-CM | POA: Insufficient documentation

## 2011-10-11 DIAGNOSIS — Z3689 Encounter for other specified antenatal screening: Secondary | ICD-10-CM | POA: Insufficient documentation

## 2011-10-11 DIAGNOSIS — O09299 Supervision of pregnancy with other poor reproductive or obstetric history, unspecified trimester: Secondary | ICD-10-CM | POA: Insufficient documentation

## 2011-10-11 DIAGNOSIS — Z3682 Encounter for antenatal screening for nuchal translucency: Secondary | ICD-10-CM

## 2011-10-11 DIAGNOSIS — O351XX0 Maternal care for (suspected) chromosomal abnormality in fetus, not applicable or unspecified: Secondary | ICD-10-CM | POA: Insufficient documentation

## 2011-10-11 DIAGNOSIS — O3510X0 Maternal care for (suspected) chromosomal abnormality in fetus, unspecified, not applicable or unspecified: Secondary | ICD-10-CM | POA: Insufficient documentation

## 2011-10-22 ENCOUNTER — Encounter (HOSPITAL_COMMUNITY): Payer: Self-pay | Admitting: Emergency Medicine

## 2011-10-22 ENCOUNTER — Emergency Department (HOSPITAL_COMMUNITY)
Admission: EM | Admit: 2011-10-22 | Discharge: 2011-10-22 | Disposition: A | Discharge status: Home / Self Care | Payer: Medicaid Other | Attending: Emergency Medicine | Admitting: Emergency Medicine

## 2011-10-22 DIAGNOSIS — Z91040 Latex allergy status: Secondary | ICD-10-CM | POA: Insufficient documentation

## 2011-10-22 DIAGNOSIS — Z91018 Allergy to other foods: Secondary | ICD-10-CM | POA: Insufficient documentation

## 2011-10-22 DIAGNOSIS — Z885 Allergy status to narcotic agent status: Secondary | ICD-10-CM | POA: Insufficient documentation

## 2011-10-22 DIAGNOSIS — F17200 Nicotine dependence, unspecified, uncomplicated: Secondary | ICD-10-CM | POA: Insufficient documentation

## 2011-10-22 DIAGNOSIS — B86 Scabies: Secondary | ICD-10-CM

## 2011-10-22 MED ORDER — PERMETHRIN 5 % EX CREA: TOPICAL_CREAM | CUTANEOUS | Status: DC

## 2011-10-22 NOTE — ED Notes (Signed)
Pt states she has developed a rash recently on her abdomen and arms. Pt states she believe it is scabies because a member of the house was dx with scabies approx 6 months.

## 2011-10-22 NOTE — ED Provider Notes (Signed)
History     CSN: [id number]  Arrival date & time 10/22/11  4098   First MD Initiated Contact with Patient 10/22/11 [phone]      Chief Complaint  Patient presents with  . Rash    (Consider location/radiation/quality/duration/timing/severity/associated sxs/prior treatment) HPI Comments: Pt with rash to stomach and arms for the past few weeks,  It itches. Not painful,  Family member recently treated for scabies by dermatologist.  Mother here for script to treat her. And family. No other complaints, no systemic symptoms. No illness.    Patient is a 31 y.o. female presenting with rash. The history is provided by the patient. No language interpreter was used.  Rash  This is a new problem. The current episode started more than 1 week ago. The problem has been gradually worsening. The problem is associated with an unknown factor. There has been no fever. The rash is present on the right arm, left arm and abdomen. The patient is experiencing no pain. Associated symptoms include itching. Pertinent negatives include no pain. She has tried nothing for the symptoms. Risk factors include new environmental exposures.    History reviewed. No pertinent past medical history.  Past Surgical History  Procedure Date  . Cesarean section     History reviewed. No pertinent family history.  History  Substance Use Topics  . Smoking status: Current Every Day Smoker -- 0.5 packs/day    Types: Cigarettes  . Smokeless tobacco: Not on file  . Alcohol Use: Yes     occ    OB History    Grav Para Term Preterm Abortions TAB SAB Ect Mult Living   3 2 2  0 0 0 0 0 0 2      Review of Systems  Skin: Positive for itching and rash.  All other systems reviewed and are negative.    Allergies  Chocolate; Latex; and Morphine and related  Home Medications   Current Outpatient Rx  Name Route Sig Dispense Refill  . PERMETHRIN 5 % EX CREA  Apply to affected area once. Repeat in one week 60 g 0    BP 133/83   Pulse 102  Temp 98.9 F (37.2 C) (Oral)  Resp 24  Wt 253 lb (114.76 kg)  SpO2 98%  LMP 07/09/2011  Physical Exam  Nursing note and vitals reviewed. Constitutional: She is oriented to person, place, and time. She appears well-developed and well-nourished.  HENT:  Head: Normocephalic and atraumatic.  Right Ear: External ear normal.  Left Ear: External ear normal.  Mouth/Throat: Oropharynx is clear and moist.  Eyes: Conjunctivae normal and EOM are normal.  Neck: Normal range of motion. Neck supple.  Cardiovascular: Normal rate, normal heart sounds and intact distal pulses.   Pulmonary/Chest: Effort normal and breath sounds normal.  Abdominal: Soft. Bowel sounds are normal. There is no tenderness. There is no rebound.  Musculoskeletal: Normal range of motion.  Neurological: She is alert and oriented to person, place, and time.  Skin: Skin is warm.       Scattered small pinpoint papules on wrist and abd.  Consistent with scabies     ED Course  Procedures (including critical care time)  Labs Reviewed - No data to display No results found.   1. Scabies       MDM  80 y with scabies.  Will treat with elimite.  Discussed signs that warrant reevaluation.          Chrystine Oiler, MD 10/22/11 1016

## 2011-10-24 ENCOUNTER — Other Ambulatory Visit (HOSPITAL_COMMUNITY): Payer: Self-pay | Admitting: Physician Assistant

## 2011-10-24 DIAGNOSIS — Z3689 Encounter for other specified antenatal screening: Secondary | ICD-10-CM

## 2011-11-14 ENCOUNTER — Ambulatory Visit (HOSPITAL_COMMUNITY)
Admission: RE | Admit: 2011-11-14 | Discharge: 2011-11-14 | Disposition: A | Discharge status: Home / Self Care | Payer: Medicaid Other | Source: Ambulatory Visit | Attending: Physician Assistant | Admitting: Physician Assistant

## 2011-11-14 DIAGNOSIS — Z3689 Encounter for other specified antenatal screening: Secondary | ICD-10-CM

## 2011-11-14 DIAGNOSIS — O09299 Supervision of pregnancy with other poor reproductive or obstetric history, unspecified trimester: Secondary | ICD-10-CM | POA: Insufficient documentation

## 2011-11-14 DIAGNOSIS — Z1389 Encounter for screening for other disorder: Secondary | ICD-10-CM | POA: Insufficient documentation

## 2011-11-14 DIAGNOSIS — Z363 Encounter for antenatal screening for malformations: Secondary | ICD-10-CM | POA: Insufficient documentation

## 2011-11-14 DIAGNOSIS — O358XX0 Maternal care for other (suspected) fetal abnormality and damage, not applicable or unspecified: Secondary | ICD-10-CM | POA: Insufficient documentation

## 2011-11-14 DIAGNOSIS — O34219 Maternal care for unspecified type scar from previous cesarean delivery: Secondary | ICD-10-CM | POA: Insufficient documentation

## 2011-11-19 ENCOUNTER — Emergency Department (HOSPITAL_COMMUNITY)
Admission: EM | Admit: 2011-11-19 | Discharge: 2011-11-20 | Disposition: A | Discharge status: Home / Self Care | Payer: BC Managed Care – PPO | Attending: Emergency Medicine | Admitting: Emergency Medicine

## 2011-11-19 ENCOUNTER — Encounter (HOSPITAL_COMMUNITY): Payer: Self-pay | Admitting: Emergency Medicine

## 2011-11-19 DIAGNOSIS — R109 Unspecified abdominal pain: Secondary | ICD-10-CM | POA: Insufficient documentation

## 2011-11-19 DIAGNOSIS — N390 Urinary tract infection, site not specified: Secondary | ICD-10-CM

## 2011-11-19 DIAGNOSIS — Z8659 Personal history of other mental and behavioral disorders: Secondary | ICD-10-CM | POA: Insufficient documentation

## 2011-11-19 DIAGNOSIS — F17200 Nicotine dependence, unspecified, uncomplicated: Secondary | ICD-10-CM | POA: Insufficient documentation

## 2011-11-19 DIAGNOSIS — B9689 Other specified bacterial agents as the cause of diseases classified elsewhere: Secondary | ICD-10-CM

## 2011-11-19 DIAGNOSIS — F309 Manic episode, unspecified: Secondary | ICD-10-CM | POA: Insufficient documentation

## 2011-11-19 DIAGNOSIS — Z79899 Other long term (current) drug therapy: Secondary | ICD-10-CM | POA: Insufficient documentation

## 2011-11-19 DIAGNOSIS — Z8541 Personal history of malignant neoplasm of cervix uteri: Secondary | ICD-10-CM | POA: Insufficient documentation

## 2011-11-19 DIAGNOSIS — O26899 Other specified pregnancy related conditions, unspecified trimester: Secondary | ICD-10-CM

## 2011-11-19 DIAGNOSIS — O139 Gestational [pregnancy-induced] hypertension without significant proteinuria, unspecified trimester: Secondary | ICD-10-CM | POA: Insufficient documentation

## 2011-11-19 DIAGNOSIS — N760 Acute vaginitis: Secondary | ICD-10-CM | POA: Insufficient documentation

## 2011-11-19 DIAGNOSIS — O2390 Unspecified genitourinary tract infection in pregnancy, unspecified trimester: Secondary | ICD-10-CM | POA: Insufficient documentation

## 2011-11-19 DIAGNOSIS — O98819 Other maternal infectious and parasitic diseases complicating pregnancy, unspecified trimester: Secondary | ICD-10-CM | POA: Insufficient documentation

## 2011-11-19 DIAGNOSIS — Z8619 Personal history of other infectious and parasitic diseases: Secondary | ICD-10-CM | POA: Insufficient documentation

## 2011-11-19 HISTORY — DX: Malignant (primary) neoplasm, unspecified: C80.1

## 2011-11-19 HISTORY — DX: Gestational (pregnancy-induced) hypertension without significant proteinuria, unspecified trimester: O13.9

## 2011-11-19 HISTORY — DX: Herpesviral infection, unspecified: B00.9

## 2011-11-19 HISTORY — DX: Major depressive disorder, single episode, unspecified: F32.9

## 2011-11-19 HISTORY — DX: Essential (primary) hypertension: I10

## 2011-11-19 HISTORY — DX: Reserved for concepts with insufficient information to code with codable children: IMO0002

## 2011-11-19 HISTORY — DX: Unspecified abnormal cytological findings in specimens from cervix uteri: R87.619

## 2011-11-19 HISTORY — DX: Bipolar disorder, unspecified: F31.9

## 2011-11-19 NOTE — ED Notes (Signed)
Pt reports she is nineteen weeks pregnant and has due date March 2014 reports she began having abd pain with light vag spotting approx one hour ago pt has been seen in the past at [facility] care

## 2011-11-19 NOTE — ED Notes (Signed)
Pt. Reports using the bathroom and noticing bright red blood in toilet. States sharp lower left abdominal pain with radiation to right lower abdomen. [redacted] weeks pregnant. Reports history of bleeding with past pregnancy.

## 2011-11-20 ENCOUNTER — Encounter (HOSPITAL_COMMUNITY): Payer: Self-pay | Admitting: Emergency Medicine

## 2011-11-20 ENCOUNTER — Emergency Department (HOSPITAL_COMMUNITY): Payer: BC Managed Care – PPO

## 2011-11-20 LAB — WET PREP, GENITAL
Trich, Wet Prep: NONE SEEN
Yeast Wet Prep HPF POC: NONE SEEN

## 2011-11-20 LAB — URINALYSIS, ROUTINE W REFLEX MICROSCOPIC
Bilirubin Urine: NEGATIVE
Glucose, UA: NEGATIVE mg/dL
Hgb urine dipstick: NEGATIVE
Ketones, ur: NEGATIVE mg/dL
Nitrite: NEGATIVE
Protein, ur: NEGATIVE mg/dL
Specific Gravity, Urine: 1.015 (ref 1.005–1.030)
Urobilinogen, UA: 0.2 mg/dL (ref 0.0–1.0)
pH: 6.5 (ref 5.0–8.0)

## 2011-11-20 LAB — URINE MICROSCOPIC-ADD ON

## 2011-11-20 MED ORDER — CEPHALEXIN 500 MG PO CAPS: 500.0000 mg | ORAL_CAPSULE | Freq: Three times a day (TID) | ORAL | Status: DC

## 2011-11-20 MED ORDER — ACETAMINOPHEN 500 MG PO TABS
1000.0000 mg | ORAL_TABLET | Freq: Once | ORAL | Status: AC
  Administered 2011-11-20: 1000 mg via ORAL
  Filled 2011-11-20: qty 2

## 2011-11-20 MED ORDER — METRONIDAZOLE 500 MG PO TABS: 500.0000 mg | ORAL_TABLET | Freq: Two times a day (BID) | ORAL | Status: DC

## 2011-11-20 NOTE — ED Provider Notes (Signed)
History     CSN: [id number]  Arrival date & time 11/19/11  2320   First MD Initiated Contact with Patient 11/20/11 0014      Chief Complaint  Patient presents with  . Abdominal Pain    nineteen weeks preg  . Possible Pregnancy    (Consider location/radiation/quality/duration/timing/severity/associated sxs/prior treatment) HPI Comments: Pt is 19 weeks 1 day by dates, with confirming U/S in her past, sees OB/GYN PA at [facility], has had all screens and tests, with known herpes, now on continuous antiviral therapy, reports has had some more crampy pains esp on left side today, had some pink blood when she wiped after urinating tonight so came to the ED.  No fevers, chills, no back pain, no heavy vaginal bleeding, discharge.  Pt denies dysuria or freq, but has had UTI without those symptoms.  No N/V.  Pt admits did not know to go to [facility], just that [facility] was closest to her.  No further bleeding that she has noted since then.    Patient is a 31 y.o. female presenting with abdominal pain. The history is provided by the patient.  Abdominal Pain The primary symptoms of the illness include abdominal pain and vaginal bleeding. The primary symptoms of the illness do not include fever, shortness of breath, nausea, vomiting or dysuria.  Additional symptoms associated with the illness include hematuria. Symptoms associated with the illness do not include chills, frequency or back pain.    Past Medical History  Diagnosis Date  . Hypertension   . Herpes   . Pregnancy induced hypertension   . Cancer     LEEP-cervical cancer  . Abnormal Pap smear   . Depression     post-partum depression  . Bipolar 1 disorder     Past Surgical History  Procedure Date  . Cesarean section     Family History  Problem Relation Age of Onset  . Diabetes Mother   . Hypertension Mother   . Mental illness Mother   . Diabetes Father   . Hypertension Father   . Asthma Sister   . Diabetes Sister   .  Diabetes Maternal Grandmother     History  Substance Use Topics  . Smoking status: Current Every Day Smoker -- 0.5 packs/day    Types: Cigarettes  . Smokeless tobacco: Not on file  . Alcohol Use: No    OB History    Grav Para Term Preterm Abortions TAB SAB Ect Mult Living   3 2 2  0 0 0 0 0 0 2      Review of Systems  Constitutional: Negative for fever and chills.  Respiratory: Negative for shortness of breath.   Cardiovascular: Negative for chest pain.  Gastrointestinal: Positive for abdominal pain. Negative for nausea and vomiting.  Genitourinary: Positive for hematuria and vaginal bleeding. Negative for dysuria, frequency, flank pain and pelvic pain.  Musculoskeletal: Negative for back pain.  Neurological: Negative for dizziness and light-headedness.  All other systems reviewed and are negative.    Allergies  Chocolate; Latex; and Morphine and related  Home Medications   Current Outpatient Rx  Name Route Sig Dispense Refill  . VALACYCLOVIR HCL 500 MG PO TABS Oral Take 500 mg by mouth 2 (two) times daily.    . CEPHALEXIN 500 MG PO CAPS Oral Take 1 capsule (500 mg total) by mouth 3 (three) times daily. 30 capsule 0  . METRONIDAZOLE 500 MG PO TABS Oral Take 1 tablet (500 mg total) by mouth 2 (two)  times daily. 14 tablet 0    BP 131/67  Pulse 88  Temp 98.1 F (36.7 C) (Oral)  Resp 20  Ht 5\' 7"  (1.702 m)  Wt 254 lb (115.214 kg)  BMI 39.78 kg/m2  SpO2 98%  LMP 07/09/2011  Physical Exam  Nursing note and vitals reviewed. Constitutional: She is oriented to person, place, and time. She appears well-developed and well-nourished. No distress.  HENT:  Head: Normocephalic and atraumatic.  Neck: Neck supple.  Cardiovascular: Normal rate and regular rhythm.   Pulmonary/Chest: Effort normal. No respiratory distress.  Abdominal: Soft. Normal appearance and bowel sounds are normal. She exhibits no distension. There is no rebound, no guarding and no CVA tenderness.    Genitourinary: Uterus is enlarged. Cervix exhibits discharge. Right adnexum displays no tenderness. Left adnexum displays tenderness. Left adnexum displays no mass and no fullness. No bleeding around the vagina. Vaginal discharge found.       Chaperone present  Neurological: She is alert and oriented to person, place, and time.  Skin: Skin is warm. She is not diaphoretic.  Psychiatric: She has a normal mood and affect.    ED Course  Procedures (including critical care time)  Labs Reviewed  URINALYSIS, ROUTINE W REFLEX MICROSCOPIC - Abnormal; Notable for the following:    APPearance TURBID (*)     Leukocytes, UA SMALL (*)     All other components within normal limits  WET PREP, GENITAL - Abnormal; Notable for the following:    Clue Cells Wet Prep HPF POC MODERATE (*)     WBC, Wet Prep HPF POC FEW (*)     All other components within normal limits  URINE MICROSCOPIC-ADD ON - Abnormal; Notable for the following:    Squamous Epithelial / LPF MANY (*)     Bacteria, UA MANY (*)     All other components within normal limits  OB RESULTS CONSOLE GC/CHLAMYDIA  OB RESULTS CONSOLE RPR  OB RESULTS CONSOLE HIV ANTIBODY (ROUTINE TESTING)  OB RESULTS CONSOLE RUBELLA ANTIBODY, IGM  OB RESULTS CONSOLE HEPATITIS B SURFACE ANTIGEN  OB RESULTS CONSOLE ABO/RH  OB RESULTS CONSOLE ANTIBODY SCREEN  GC/CHLAMYDIA PROBE AMP, GENITAL  URINE CULTURE   US Ob Limited  11/20/2011  *RADIOLOGY REPORT*  Clinical Data: Spotting and pain.  Evaluate placental location. Estimated gestational age by LMP is 19 weeks 1 day.  LIMITED OBSTETRIC ULTRASOUND  Number of Fetuses: 1 Heart Rate: 141 bpm Movement: Fetal movement is visualized. Presentation: Variable presentation. Placental Location: Placenta is anterior and is not appear low- lying. Previa: No previa. Amniotic Fluid (Subjective): Normal  Vertical pocket:  4.30cm  BPD: 4.4cm   19w   oned EDC: 04/14/2012  MATERNAL FINDINGS: Cervix: Limited visualization of the cervix.   Visualized cervix appears closed with measured length of about 4.5 cm. Uterus/Adnexae: Uterus and adnexal structures are not well visualized.  There appears to be a uterine contraction.  IMPRESSION: Single intrauterine pregnancy.  Variable presentation.  Placenta is anterior without evidence of previa.  Recommend followup with non-emergent complete OB 14+ wk US examination for fetal biometric evaluation and anatomic survey if not already performed.   Original Report Authenticated By: Marlon Pel, M.D.      1. UTI (lower urinary tract infection)   2. BV (bacterial vaginosis)   3. Abdominal pain in pregnancy     RA sat is 98% and I interpret to be normal.   3:52 AM U/S shows viable fetus.  UA suggests mild UTI, but no blood,  no blood on vaginal exam, BV present.  Due to crampy pains, will treat for both UTI and BV.  Pt can follow up with her own OB/GYN this week for recheck.    MDM  Despite complaint of pain, pt has no guard or rebound on exam.  Will perform pelvic and check UA for blood.  Rapid response RN also called.          Gavin Pound. Oletta Lamas, MD 11/20/11 [phone]

## 2011-11-20 NOTE — Progress Notes (Signed)
D/C instructions given to patient per [facility]. Pt. Verbalized understanding.

## 2011-11-20 NOTE — Discharge Instructions (Signed)
 Abdominal Pain During Pregnancy Abdominal discomfort is common in pregnancy. Most of the time, it does not cause harm. There are many causes of abdominal pain. Some causes are more serious than others. Some of the causes of abdominal pain in pregnancy are easily diagnosed. Occasionally, the diagnosis takes time to understand. Other times, the cause is not determined. Abdominal pain can be a sign that something is very wrong with the pregnancy, or the pain may have nothing to do with the pregnancy at all. For this reason, always tell your caregiver if you have any abdominal discomfort. CAUSES Common and harmless causes of abdominal pain include:  Constipation.  Excess gas and bloating.  Round ligament pain. This is pain that is felt in the folds of the groin.  The position the baby or placenta is in.  Baby kicks.  Braxton-Hicks contractions. These are mild contractions that do not cause cervical dilation. Serious causes of abdominal pain include:  Ectopic pregnancy. This happens when a fertilized egg implants outside of the uterus.  Miscarriage.  Preterm labor. This is when labor starts at less than 37 weeks of pregnancy.  Placental abruption. This is when the placenta partially or completely separates from the uterus.  Preeclampsia. This is often associated with high blood pressure and has been referred to as toxemia in pregnancy.  Uterine or amniotic fluid infections. Causes unrelated to pregnancy include:  Urinary tract infection.  Gallbladder stones or inflammation.  Hepatitis or other liver illness.  Intestinal problems, stomach flu, food poisoning, or ulcer.  Appendicitis.  Kidney (renal) stones.  Kidney infection (pylonephritis). HOME CARE INSTRUCTIONS  For mild pain:  Do not have sexual intercourse or put anything in your vagina until your symptoms go away completely.  Get plenty of rest until your pain improves. If your pain does not improve in 1 hour, call  your caregiver.  Drink clear fluids if you feel nauseous. Avoid solid food as long as you are uncomfortable or nauseous.  Only take medicine as directed by your caregiver.  Keep all follow-up appointments with your caregiver. SEEK IMMEDIATE MEDICAL CARE IF:  You are bleeding, leaking fluid, or passing tissue from the vagina.  You have increasing pain or cramping.  You have persistent vomiting.  You have painful or bloody urination.  You have a fever.  You notice a decrease in your baby's movements.  You have extreme weakness or feel faint.  You have shortness of breath, with or without abdominal pain.  You develop a severe headache with abdominal pain.  You have abnormal vaginal discharge with abdominal pain.  You have persistent diarrhea.  You have abdominal pain that continues even after rest, or gets worse. MAKE SURE YOU:   Understand these instructions.  Will watch your condition.  Will get help right away if you are not doing well or get worse. Document Released: 01/10/2005 Document Revised: 04/04/2011 Document Reviewed: 08/06/2010 [facility] Patient Information 2013 [place], [place].     Bacterial Vaginosis Bacterial vaginosis (BV) is a vaginal infection where the normal balance of bacteria in the vagina is disrupted. The normal balance is then replaced by an overgrowth of certain bacteria. There are several different kinds of bacteria that can cause BV. BV is the most common vaginal infection in women of childbearing age. CAUSES   The cause of BV is not fully understood. BV develops when there is an increase or imbalance of harmful bacteria.  Some activities or behaviors can upset the normal balance of bacteria in the vagina  and put women at increased risk including:  Having a new sex partner or multiple sex partners.  Douching.  Using an intrauterine device (IUD) for contraception.  It is not clear what role sexual activity plays in the development of  BV. However, women that have never had sexual intercourse are rarely infected with BV. Women do not get BV from toilet seats, bedding, swimming pools or from touching objects around them.  SYMPTOMS   Grey vaginal discharge.  A fish-like odor with discharge, especially after sexual intercourse.  Itching or burning of the vagina and vulva.  Burning or pain with urination.  Some women have no signs or symptoms at all. DIAGNOSIS  Your caregiver must examine the vagina for signs of BV. Your caregiver will perform lab tests and look at the sample of vaginal fluid through a microscope. They will look for bacteria and abnormal cells (clue cells), a pH test higher than 4.5, and a positive amine test all associated with BV.  RISKS AND COMPLICATIONS   Pelvic inflammatory disease (PID).  Infections following gynecology surgery.  Developing HIV.  Developing herpes virus. TREATMENT  Sometimes BV will clear up without treatment. However, all women with symptoms of BV should be treated to avoid complications, especially if gynecology surgery is planned. Female partners generally do not need to be treated. However, BV may spread between female sex partners so treatment is helpful in preventing a recurrence of BV.   BV may be treated with antibiotics. The antibiotics come in either pill or vaginal cream forms. Either can be used with nonpregnant or pregnant women, but the recommended dosages differ. These antibiotics are not harmful to the baby.  BV can recur after treatment. If this happens, a second round of antibiotics will often be prescribed.  Treatment is important for pregnant women. If not treated, BV can cause a premature delivery, especially for a pregnant woman who had a premature birth in the past. All pregnant women who have symptoms of BV should be checked and treated.  For chronic reoccurrence of BV, treatment with a type of prescribed gel vaginally twice a week is helpful. HOME CARE  INSTRUCTIONS   Finish all medication as directed by your caregiver.  Do not have sex until treatment is completed.  Tell your sexual partner that you have a vaginal infection. They should see their caregiver and be treated if they have problems, such as a mild rash or itching.  Practice safe sex. Use condoms. Only have 1 sex partner. PREVENTION  Basic prevention steps can help reduce the risk of upsetting the natural balance of bacteria in the vagina and developing BV:  Do not have sexual intercourse (be abstinent).  Do not douche.  Use all of the medicine prescribed for treatment of BV, even if the signs and symptoms go away.  Tell your sex partner if you have BV. That way, they can be treated, if needed, to prevent reoccurrence. SEEK MEDICAL CARE IF:   Your symptoms are not improving after 3 days of treatment.  You have increased discharge, pain, or fever. MAKE SURE YOU:   Understand these instructions.  Will watch your condition.  Will get help right away if you are not doing well or get worse. FOR MORE INFORMATION  Division of STD Prevention (DSTDP), Centers for Disease Control and Prevention: [employer] American Social Health Association (ASHA): www.ashastd.org  Document Released: 01/10/2005 Document Revised: 04/04/2011 Document Reviewed: 07/03/2008 [facility] Patient Information 2013 [place], [place].     Urinary Tract Infection  Urinary tract infections (UTIs) can develop anywhere along your urinary tract. Your urinary tract is your body's drainage system for removing wastes and extra water . Your urinary tract includes two kidneys, two ureters, a bladder, and a urethra. Your kidneys are a pair of bean-shaped organs. Each kidney is about the size of your fist. They are located below your ribs, one on each side of your spine. CAUSES Infections are caused by microbes, which are microscopic organisms, including fungi, viruses, and bacteria. These organisms are so small  that they can only be seen through a microscope. Bacteria are the microbes that most commonly cause UTIs. SYMPTOMS  Symptoms of UTIs may vary by age and gender of the patient and by the location of the infection. Symptoms in young women typically include a frequent and intense urge to urinate and a painful, burning feeling in the bladder or urethra during urination. Older women and men are more likely to be tired, shaky, and weak and have muscle aches and abdominal pain. A fever may mean the infection is in your kidneys. Other symptoms of a kidney infection include pain in your back or sides below the ribs, nausea, and vomiting. DIAGNOSIS To diagnose a UTI, your caregiver will ask you about your symptoms. Your caregiver also will ask to provide a urine sample. The urine sample will be tested for bacteria and white blood cells. White blood cells are made by your body to help fight infection. TREATMENT  Typically, UTIs can be treated with medication. Because most UTIs are caused by a bacterial infection, they usually can be treated with the use of antibiotics. The choice of antibiotic and length of treatment depend on your symptoms and the type of bacteria causing your infection. HOME CARE INSTRUCTIONS  If you were prescribed antibiotics, take them exactly as your caregiver instructs you. Finish the medication even if you feel better after you have only taken some of the medication.  Drink enough water  and fluids to keep your urine clear or pale yellow.  Avoid caffeine, tea, and carbonated beverages. They tend to irritate your bladder.  Empty your bladder often. Avoid holding urine for long periods of time.  Empty your bladder before and after sexual intercourse.  After a bowel movement, women should cleanse from front to back. Use each tissue only once. SEEK MEDICAL CARE IF:   You have back pain.  You develop a fever.  Your symptoms do not begin to resolve within 3 days. SEEK IMMEDIATE  MEDICAL CARE IF:   You have severe back pain or lower abdominal pain.  You develop chills.  You have nausea or vomiting.  You have continued burning or discomfort with urination. MAKE SURE YOU:   Understand these instructions.  Will watch your condition.  Will get help right away if you are not doing well or get worse. Document Released: 10/20/2004 Document Revised: 07/12/2011 Document Reviewed: 02/18/2011 [facility] Patient Information 2013 [place], [place].

## 2011-11-20 NOTE — Progress Notes (Signed)
Pt. To radiology via wheelchair.

## 2011-11-20 NOTE — ED Notes (Signed)
Pt to ultrasound

## 2011-11-21 LAB — URINE CULTURE
Colony Count: NO GROWTH
Culture: NO GROWTH

## 2011-11-21 LAB — GC/CHLAMYDIA PROBE AMP, GENITAL
Chlamydia, DNA Probe: NEGATIVE
GC Probe Amp, Genital: NEGATIVE

## 2011-12-07 ENCOUNTER — Other Ambulatory Visit (HOSPITAL_COMMUNITY): Payer: Self-pay | Admitting: Physician Assistant

## 2011-12-07 DIAGNOSIS — IMO0002 Reserved for concepts with insufficient information to code with codable children: Secondary | ICD-10-CM

## 2011-12-07 DIAGNOSIS — Z0489 Encounter for examination and observation for other specified reasons: Secondary | ICD-10-CM

## 2011-12-14 ENCOUNTER — Ambulatory Visit (HOSPITAL_COMMUNITY)
Admission: RE | Admit: 2011-12-14 | Discharge: 2011-12-14 | Disposition: A | Discharge status: Home / Self Care | Payer: Medicaid Other | Source: Ambulatory Visit | Attending: Physician Assistant | Admitting: Physician Assistant

## 2011-12-14 DIAGNOSIS — Z0489 Encounter for examination and observation for other specified reasons: Secondary | ICD-10-CM

## 2011-12-14 DIAGNOSIS — Z3689 Encounter for other specified antenatal screening: Secondary | ICD-10-CM | POA: Insufficient documentation

## 2011-12-14 DIAGNOSIS — IMO0002 Reserved for concepts with insufficient information to code with codable children: Secondary | ICD-10-CM

## 2011-12-14 DIAGNOSIS — O09299 Supervision of pregnancy with other poor reproductive or obstetric history, unspecified trimester: Secondary | ICD-10-CM | POA: Insufficient documentation

## 2011-12-14 DIAGNOSIS — O34219 Maternal care for unspecified type scar from previous cesarean delivery: Secondary | ICD-10-CM | POA: Insufficient documentation

## 2012-01-09 ENCOUNTER — Inpatient Hospital Stay (HOSPITAL_COMMUNITY)
Admission: AD | Admit: 2012-01-09 | Discharge: 2012-01-09 | Disposition: A | Discharge status: Home / Self Care | Payer: Medicaid Other | Source: Ambulatory Visit | Attending: Obstetrics & Gynecology | Admitting: Obstetrics & Gynecology

## 2012-01-09 ENCOUNTER — Encounter (HOSPITAL_COMMUNITY): Payer: Self-pay | Admitting: *Deleted

## 2012-01-09 DIAGNOSIS — N949 Unspecified condition associated with female genital organs and menstrual cycle: Secondary | ICD-10-CM

## 2012-01-09 DIAGNOSIS — L259 Unspecified contact dermatitis, unspecified cause: Secondary | ICD-10-CM

## 2012-01-09 DIAGNOSIS — R109 Unspecified abdominal pain: Secondary | ICD-10-CM | POA: Insufficient documentation

## 2012-01-09 DIAGNOSIS — O99891 Other specified diseases and conditions complicating pregnancy: Secondary | ICD-10-CM | POA: Insufficient documentation

## 2012-01-09 MED ORDER — DIPHENHYDRAMINE HCL 25 MG PO CAPS
25.0000 mg | ORAL_CAPSULE | Freq: Once | ORAL | Status: AC
  Administered 2012-01-09: 25 mg via ORAL
  Filled 2012-01-09: qty 1

## 2012-01-09 MED ORDER — HYDROCORTISONE 1 % EX LOTN: TOPICAL_LOTION | Freq: Two times a day (BID) | CUTANEOUS | Status: DC

## 2012-01-09 NOTE — MAU Provider Note (Signed)
Attestation of Attending Supervision of Advanced Practitioner (CNM/NP): Evaluation and management procedures were performed by the Advanced Practitioner under my supervision and collaboration.  I have reviewed the Advanced Practitioner's note and chart, and I agree with the management and plan.  HARRAWAY-SMITH, Shirlette Scarber 7:36 PM     

## 2012-01-09 NOTE — MAU Note (Signed)
Pt C/O rash from her thighs down since this a.m.  Pt used Darene Lamer yesterday afternoon, not sure if she is allergic to it.  Pt also pain in R groin down into R thigh, limping when she walks.  Pt denies bleeding.

## 2012-01-09 NOTE — MAU Provider Note (Signed)
CC: Rash    First Provider Initiated Contact with Patient 01/09/12 1351      HPI Robin Martin is a 31 y.o. [id number] [redacted]w[redacted]d who presents with onset of itchy bumpy rash on both lower extremities where she had applied Darene Lamer for hair removal yesterday. Hot shower made it worse. Also has right groin pain, worse with moving, eased with rest. No dysuria, urgency, frequency. On her feet 8 hours a day at  and has right groin pain with walking. No UCs, LOF, VB. Good FM.   Past Medical History  Diagnosis Date  . Hypertension   . Herpes   . Pregnancy induced hypertension   . Cancer     LEEP-cervical cancer  . Abnormal Pap smear   . Depression     post-partum depression  . Bipolar 1 disorder     OB History    Grav Para Term Preterm Abortions TAB SAB Ect Mult Living   3 2 2  0 0 0 0 0 0 2     # Outc Date GA Lbr Len/2nd Wgt Sex Del Anes PTL Lv   1 TRM 2002 [redacted]w[redacted]d  8lb(3.629kg) M    Yes   2 TRM 2005 [redacted]w[redacted]d   M CS   Yes   3 CUR               Past Surgical History  Procedure Date  . Cesarean section   . Leep     History   Social History  . Marital Status: Divorced    Spouse Name: N/A    Number of Children: N/A  . Years of Education: N/A   Occupational History  . Not on file.   Social History Main Topics  . Smoking status: Current Every Day Smoker -- 0.5 packs/day    Types: Cigarettes  . Smokeless tobacco: Never Used  . Alcohol Use: No  . Drug Use: No  . Sexually Active: Yes   Other Topics Concern  . Not on file   Social History Narrative  . No narrative on file    No current facility-administered medications on file prior to encounter.   No current outpatient prescriptions on file prior to encounter.    Allergies  Allergen Reactions  . Chocolate Other (See Comments)    Thins blood  . Latex Rash  . Morphine And Related Itching, Nausea And Vomiting and Rash    ROS Pertinent items in HPI  PHYSICAL EXAM Filed Vitals:   01/09/12 1056  BP: 125/65  Pulse: 79   Temp: 98.1 F (36.7 C)  Resp: 18   General: Well nourished, well developed female in no acute distress Cardiovascular: Normal rate Respiratory: Normal effort Abdomen: Soft, nontender, S=D Back: No CVAT Extremities: No edema Neurologic: Alert and oriented Skin: diffuse papular rash over both LEs, some patchy areas of excoriation and erythema VE: cx L/C/H  FHR 130 reactive Toco: No UCs  No results found for this or any previous visit (from the past 24 hour(s)). MAU COURSE Benadryl 25 mg po given with some relief  ASSESSMENT  1. Round ligament pain   2. Contact dermatitis   [id number] at [redacted]w[redacted]d  PLAN Discharge home. See AVS for patient education. Advised cool compresses and not to use depillitary products  Follow-up Information    Follow up with [facility]. (keep your regular prenatal appointment)            Medication List     As of 01/09/2012  2:19 PM  TAKE these medications         acetaminophen 325 MG tablet   Commonly known as: TYLENOL   Take 650 mg by mouth every 6 (six) hours as needed. Takes for pain      hydrocortisone 1 % lotion   Apply topically 2 (two) times daily.      prenatal multivitamin Tabs   Take 1 tablet by mouth daily.      valACYclovir 500 MG tablet   Commonly known as: VALTREX   Take 500 mg by mouth 2 (two) times daily.           Danae Orleans, CNM 01/09/2012 1:58 PM

## 2012-02-02 ENCOUNTER — Encounter: Payer: Self-pay | Admitting: *Deleted

## 2012-02-02 DIAGNOSIS — Z302 Encounter for sterilization: Secondary | ICD-10-CM

## 2012-02-04 ENCOUNTER — Encounter (HOSPITAL_COMMUNITY): Payer: Self-pay | Admitting: *Deleted

## 2012-02-04 ENCOUNTER — Inpatient Hospital Stay (HOSPITAL_COMMUNITY)
Admission: AD | Admit: 2012-02-04 | Discharge: 2012-02-04 | Disposition: A | Discharge status: Home / Self Care | Payer: Medicaid Other | Source: Ambulatory Visit | Attending: Obstetrics & Gynecology | Admitting: Obstetrics & Gynecology

## 2012-02-04 DIAGNOSIS — IMO0002 Reserved for concepts with insufficient information to code with codable children: Secondary | ICD-10-CM | POA: Insufficient documentation

## 2012-02-04 DIAGNOSIS — F419 Anxiety disorder, unspecified: Secondary | ICD-10-CM

## 2012-02-04 DIAGNOSIS — O99340 Other mental disorders complicating pregnancy, unspecified trimester: Secondary | ICD-10-CM | POA: Insufficient documentation

## 2012-02-04 DIAGNOSIS — R5381 Other malaise: Secondary | ICD-10-CM

## 2012-02-04 DIAGNOSIS — F411 Generalized anxiety disorder: Secondary | ICD-10-CM

## 2012-02-04 DIAGNOSIS — R079 Chest pain, unspecified: Secondary | ICD-10-CM | POA: Insufficient documentation

## 2012-02-04 DIAGNOSIS — O99891 Other specified diseases and conditions complicating pregnancy: Secondary | ICD-10-CM

## 2012-02-04 DIAGNOSIS — Z302 Encounter for sterilization: Secondary | ICD-10-CM

## 2012-02-04 LAB — URINALYSIS, ROUTINE W REFLEX MICROSCOPIC
Bilirubin Urine: NEGATIVE
Glucose, UA: NEGATIVE mg/dL
Hgb urine dipstick: NEGATIVE
Ketones, ur: NEGATIVE mg/dL
Leukocytes, UA: NEGATIVE
Nitrite: NEGATIVE
Protein, ur: NEGATIVE mg/dL
Specific Gravity, Urine: 1.01 (ref 1.005–1.030)
Urobilinogen, UA: 0.2 mg/dL (ref 0.0–1.0)
pH: 6.5 (ref 5.0–8.0)

## 2012-02-04 MED ORDER — ACETAMINOPHEN 325 MG PO TABS: 650.0000 mg | ORAL_TABLET | Freq: Four times a day (QID) | ORAL | Status: DC | PRN

## 2012-02-04 NOTE — MAU Note (Signed)
Chest pain started last night. Got up to shower for work and got dizzy and threw up.

## 2012-02-04 NOTE — MAU Provider Note (Signed)
Robin Martin is a 32 y.o. [id number] at [redacted]w[redacted]d  presenting for chest tightness and "feeling like I've been hit by a [employer]". She began feeling intermittent tightness in her chest yesterday afternoon while at work, and it resolved without issue, but then this morning as she was showering, she began feeling lightheaded and then vomited. She reports having difficulties with her prior pregnancies and deliveries, so her threshold was high for a dire event, so she reported to the MAU.  At the time of my exam, she was resting in bed, mildly anxious, and complaining of a headache and a continued generalized weakness.  She was not experiencing any chest pain, and does report several stressors in her life, and that thinking about them all at once coincide with her chest tightness. She also reports that her boss had to leave work yesterday due to an acute illness, the primary symptom being generalized fatigue.  History OB History    Grav Para Term Preterm Abortions TAB SAB Ect Mult Living   3 2 2  0 0 0 0 0 0 2     Past Medical History  Diagnosis Date  . Hypertension   . Herpes   . Pregnancy induced hypertension   . Cancer     LEEP-cervical cancer  . Abnormal Pap smear   . Depression     post-partum depression  . Bipolar 1 disorder    Past Surgical History  Procedure Date  . Cesarean section   . Leep    Family History: family history includes Asthma in her sister; Diabetes in her father, maternal grandmother, mother, and sister; Hypertension in her father and mother; and Mental illness in her mother. Social History:  reports that she has been smoking Cigarettes.  She has been smoking about .5 packs per day. She has never used smokeless tobacco. She reports that she does not drink alcohol or use illicit drugs.    Review of Systems  Constitutional: Positive for malaise/fatigue. Negative for fever and chills.  Eyes: Negative for blurred vision and double vision.  Respiratory: Negative for cough  and wheezing.   Cardiovascular: Positive for chest pain and leg swelling. Negative for palpitations.  Gastrointestinal: Positive for vomiting. Negative for abdominal pain, diarrhea and constipation.  Genitourinary: Negative for dysuria and hematuria.  Musculoskeletal: Negative for falls.  Neurological: Positive for dizziness, weakness and headaches. Negative for tingling, focal weakness and loss of consciousness.  Psychiatric/Behavioral: Negative for depression and substance abuse. The patient is nervous/anxious.        She does have a history of depression, which she describes as situational, and has now resolved      Blood pressure 125/75, pulse 96, temperature 98.3 F (36.8 C), temperature source Oral, resp. rate 20, height 5' 7.5" (1.715 m), weight 116.393 kg (256 lb 9.6 oz), last menstrual period 07/09/2011, SpO2 98.00%. Maternal Exam:  Uterine Assessment: No contractions noted on toco  Abdomen: Patient reports no abdominal tenderness.   Fetal Exam Fetal Monitor Review: Baseline rate: 135.  Variability: moderate (6-25 bpm).   Pattern: accelerations present and no decelerations.    Fetal State Assessment: Category I - tracings are normal.     Physical Exam  Constitutional: She is oriented to person, place, and time. She appears well-developed and well-nourished. No distress.  HENT:  Head: Normocephalic.  Mouth/Throat: No oropharyngeal exudate.  Eyes: Conjunctivae normal are normal. Pupils are equal, round, and reactive to light.  Neck: Normal range of motion. Neck supple.  Cardiovascular: Normal rate, regular  rhythm, normal heart sounds and intact distal pulses.   No murmur heard. Respiratory: Effort normal and breath sounds normal. No respiratory distress. She exhibits no tenderness.  GI: Soft.       gravid  Musculoskeletal: Normal range of motion. She exhibits no edema and no tenderness.  Neurological: She is alert and oriented to person, place, and time. No cranial  nerve deficit. Coordination normal.  Skin: Skin is warm and dry. She is not diaphoretic. No erythema.  Psychiatric: Judgment normal.       She pointed out that she had been anxious about life in general lately, but was not acutely anxious during my exam, She was laughing and conversant after she became comfortable with her environs.    Prenatal labs: ABO, Rh: O/Negative/-- (09/04 0000) Antibody: Negative (09/04 0000) Rubella: Immune (09/04 0000) RPR: Nonreactive (09/04 0000)  HBsAg: Negative (09/04 0000)  HIV: Non-reactive (09/04 0000)  GBS:      Results for orders placed during the hospital encounter of 02/04/12 (from the past 24 hour(s))  URINALYSIS, ROUTINE W REFLEX MICROSCOPIC     Status: Normal   Collection Time   02/04/12  6:07 AM      Component Value Range   Color, Urine YELLOW  YELLOW   APPearance CLEAR  CLEAR   Specific Gravity, Urine 1.010  1.005 - 1.030   pH 6.5  5.0 - 8.0   Glucose, UA NEGATIVE  NEGATIVE mg/dL   Hgb urine dipstick NEGATIVE  NEGATIVE   Bilirubin Urine NEGATIVE  NEGATIVE   Ketones, ur NEGATIVE  NEGATIVE mg/dL   Protein, ur NEGATIVE  NEGATIVE mg/dL   Urobilinogen, UA 0.2  0.0 - 1.0 mg/dL   Nitrite NEGATIVE  NEGATIVE   Leukocytes, UA NEGATIVE  NEGATIVE   EKG: NSR, no MI   Assessment/Plan: Robin Martin is a [id number] at [redacted]w[redacted]d who presents for chest pain related to anxiety and generalized fatigue 1. Sterilization   2. Anxiety    She is to follow up with the health department on Monday for eval for anxiety 3. Generalized  fatigue/achiness likely secondary to beginnings of viral illness, given her known sick contact. Today she is not febrile and has no other symptoms, so a flu test was no performed. Advised to contact her PCP should she develop worsening symptoms  Sterilization desired after birth   Bonnita Nasuti 02/04/2012, 8:39 AM  I have seen and examined this patient and I agree with the above. Cam Hai 8:46 PM 02/07/2012

## 2012-02-04 NOTE — Progress Notes (Signed)
Dr Anna Genre notified of pt's admission and status. Will see pt

## 2012-02-04 NOTE — MAU Note (Signed)
I also have a headache. "My head is pounding"

## 2012-02-08 ENCOUNTER — Inpatient Hospital Stay (HOSPITAL_COMMUNITY)
Admission: AD | Admit: 2012-02-08 | Discharge: 2012-02-08 | Disposition: A | Discharge status: Home / Self Care | Payer: Medicaid Other | Source: Ambulatory Visit | Attending: Obstetrics & Gynecology | Admitting: Obstetrics & Gynecology

## 2012-02-08 ENCOUNTER — Encounter (HOSPITAL_COMMUNITY): Payer: Self-pay | Admitting: *Deleted

## 2012-02-08 DIAGNOSIS — O99891 Other specified diseases and conditions complicating pregnancy: Secondary | ICD-10-CM | POA: Insufficient documentation

## 2012-02-08 DIAGNOSIS — R109 Unspecified abdominal pain: Secondary | ICD-10-CM | POA: Insufficient documentation

## 2012-02-08 DIAGNOSIS — N949 Unspecified condition associated with female genital organs and menstrual cycle: Secondary | ICD-10-CM

## 2012-02-08 LAB — URINALYSIS, ROUTINE W REFLEX MICROSCOPIC
Bilirubin Urine: NEGATIVE
Glucose, UA: NEGATIVE mg/dL
Hgb urine dipstick: NEGATIVE
Ketones, ur: 15 mg/dL — AB
Leukocytes, UA: NEGATIVE
Nitrite: NEGATIVE
Protein, ur: NEGATIVE mg/dL
Specific Gravity, Urine: 1.02 (ref 1.005–1.030)
Urobilinogen, UA: 0.2 mg/dL (ref 0.0–1.0)
pH: 5.5 (ref 5.0–8.0)

## 2012-02-08 NOTE — MAU Provider Note (Signed)
I examined pt and agree with documentation above and resident plan of care.  In addition, pt notes pain in chest with coughing only.  Pt laughing and telling jokes during visit.  No signs of acute distress.  Cervix - closed. [facility]

## 2012-02-08 NOTE — MAU Provider Note (Signed)
History   [name] is a 32 y.o. [id number] at [redacted]w[redacted]d who presents to the MAU due to intense abdominal pain that began this morning as soon as she got to work, at 06:40. She describes the pain as initially a shooting pain that radiated from her back to her front, on the left, and then the back pain went away, but the abdominal pain remained. As she has experienced both Braxton-Hicks contractions and Round ligament pain before, and this pain was not similar to either, she came to the MAU.  She has not noted any bleeding, LOF, and the fetus continues to move well. At the time of my exam, she had just awakened, and was not in pain.  Of note, she was in the MAU four days ago for generalized weakness and anxiety, with her only known sick contact being her boss, who has since tested positive for influenza.  CSN: [id number]  Arrival date and time: 02/08/12 1308   First Provider Initiated Contact with Patient 02/08/12 0957      Chief Complaint  Patient presents with  . Abdominal Pain   Abdominal Pain This is a new problem. The current episode started today. The onset quality is sudden. The problem has been resolved. The pain is located in the LLQ. The pain is at a severity of 8/10. The pain is moderate. The quality of the pain is sharp. Associated symptoms include headaches. Pertinent negatives include no dysuria or hematuria. The pain is relieved by being still. She has tried nothing for the symptoms. C/S with second child    OB Hx: had difficult vaginal delivery with first delivery, second delivery was a planned C-section. Planning C-section and BTL.   Past Medical History  Diagnosis Date  . Hypertension   . Herpes   . Pregnancy induced hypertension   . Cancer     LEEP-cervical cancer  . Abnormal Pap smear   . Depression     post-partum depression  . Bipolar 1 disorder     Past Surgical History  Procedure Date  . Leep   . Cesarean section     X 1, 2nd baby    Family History    Problem Relation Age of Onset  . Diabetes Mother   . Hypertension Mother   . Mental illness Mother   . Diabetes Father   . Hypertension Father   . Asthma Sister   . Diabetes Sister   . Diabetes Maternal Grandmother     History  Substance Use Topics  . Smoking status: Current Every Day Smoker -- 0.5 packs/day    Types: Cigarettes  . Smokeless tobacco: Never Used  . Alcohol Use: No    Allergies:  Allergies  Allergen Reactions  . Chocolate Other (See Comments)    Thins blood  . Latex Rash  . Morphine And Related Itching, Nausea And Vomiting and Rash    Prescriptions prior to admission  Medication Sig Dispense Refill  . acetaminophen (TYLENOL) 325 MG tablet Take 650 mg by mouth every 6 (six) hours as needed. Takes for pain      . calcium carbonate-magnesium hydroxide (ROLAIDS) 334 MG CHEW Chew 2 tablets by mouth 2 (two) times daily with a meal.      . Prenatal Vit-Fe Fumarate-FA (PRENATAL MULTIVITAMIN) TABS Take 1 tablet by mouth daily.      . valACYclovir (VALTREX) 500 MG tablet Take 500 mg by mouth 2 (two) times daily.        Review of Systems  Constitutional:  Positive for malaise/fatigue.       Subjective fever   HENT: Negative for congestion and sore throat.   Eyes: Negative for blurred vision.  Respiratory: Negative for cough.   Cardiovascular: Positive for chest pain. Negative for palpitations and leg swelling.  Gastrointestinal: Positive for abdominal pain.  Genitourinary: Negative for dysuria and hematuria.  Neurological: Positive for headaches. Negative for dizziness and tingling.  Psychiatric/Behavioral: The patient is nervous/anxious.   She does note that she has not eaten in the last couple of days secondary to what she believes to be the flu. When she did try to eat this morning, she vomited, but is no longer nauseated.  Physical Exam   Blood pressure 132/73, pulse 95, temperature 98.2 F (36.8 C), temperature source Oral, resp. rate 20, height 5' 7.5"  (1.715 m), weight 114.363 kg (252 lb 2 oz), last menstrual period 07/09/2011.  Physical Exam  Vitals reviewed. Constitutional: She appears well-developed and well-nourished. No distress.  HENT:  Head: Normocephalic and atraumatic.  Eyes: Conjunctivae normal and EOM are normal. Pupils are equal, round, and reactive to light.  Neck: Normal range of motion. Neck supple.  Respiratory: Effort normal and breath sounds normal. No respiratory distress. She has no wheezes.  GI: Soft.  Genitourinary:       Left CVA tenderness  Musculoskeletal: Normal range of motion. She exhibits no tenderness.  Neurological: She is alert.  Skin: Skin is warm and dry.  Psychiatric: She has a normal mood and affect. Her behavior is normal.       Became tearful during exam when speaking about stressors at home.   No contractions noted on toco, FHR baseline 140, no decels, some accels, no decels  Cervical exam: closed, posterior    MAU Course  Procedures Results for orders placed during the hospital encounter of 02/08/12 (from the past 24 hour(s))  URINALYSIS, ROUTINE W REFLEX MICROSCOPIC     Status: Abnormal   Collection Time   02/08/12  8:45 AM      Component Value Range   Color, Urine YELLOW  YELLOW   APPearance CLEAR  CLEAR   Specific Gravity, Urine 1.020  1.005 - 1.030   pH 5.5  5.0 - 8.0   Glucose, UA NEGATIVE  NEGATIVE mg/dL   Hgb urine dipstick NEGATIVE  NEGATIVE   Bilirubin Urine NEGATIVE  NEGATIVE   Ketones, ur 15 (*) NEGATIVE mg/dL   Protein, ur NEGATIVE  NEGATIVE mg/dL   Urobilinogen, UA 0.2  0.0 - 1.0 mg/dL   Nitrite NEGATIVE  NEGATIVE   Leukocytes, UA NEGATIVE  NEGATIVE    Assessment and Plan  [name] is a 32 y.o. [id number] at [redacted]w[redacted]d who presented to the MAU due to intense abdominal pain that has since resolved. Her urine was negative for a UTI, and her fetal monitoring was reassuring The encounter diagnosis was Round ligament pain. Patient advised to return with any signs of  bleeding. She may take OTC tylenol for pain, though she did not require any medication during her MAU visit, as her symptoms had resolved.  [name] also expresses several stressors at this time, and was in the MAU on Saturday due to anxiety. Advised to seek behavioral health eval through the health department.    Dewane Timson 02/08/2012, 10:04 AM

## 2012-02-08 NOTE — MAU Note (Signed)
Pain started at 0645. Sharp, tight, aching all over and through abdomen. Nausea started this morning.

## 2012-03-13 ENCOUNTER — Encounter (HOSPITAL_COMMUNITY): Payer: Self-pay | Admitting: *Deleted

## 2012-03-13 ENCOUNTER — Inpatient Hospital Stay (HOSPITAL_COMMUNITY)
Admission: AD | Admit: 2012-03-13 | Discharge: 2012-03-13 | Disposition: A | Discharge status: Home / Self Care | Payer: Medicaid Other | Source: Ambulatory Visit | Attending: Obstetrics and Gynecology | Admitting: Obstetrics and Gynecology

## 2012-03-13 DIAGNOSIS — IMO0002 Reserved for concepts with insufficient information to code with codable children: Secondary | ICD-10-CM | POA: Insufficient documentation

## 2012-03-13 DIAGNOSIS — O1203 Gestational edema, third trimester: Secondary | ICD-10-CM

## 2012-03-13 LAB — COMPREHENSIVE METABOLIC PANEL WITH GFR
ALT: 6 U/L (ref 0–35)
AST: 10 U/L (ref 0–37)
Albumin: 2.7 g/dL — ABNORMAL LOW (ref 3.5–5.2)
Alkaline Phosphatase: 116 U/L (ref 39–117)
BUN: 10 mg/dL (ref 6–23)
CO2: 19 meq/L (ref 19–32)
Calcium: 9 mg/dL (ref 8.4–10.5)
Chloride: 104 meq/L (ref 96–112)
Creatinine, Ser: 0.53 mg/dL (ref 0.50–1.10)
GFR calc Af Amer: >90 mL/min (ref >90)
GFR calc non Af Amer: >90 mL/min (ref >90)
Glucose, Bld: 92 mg/dL (ref 70–99)
Potassium: 3.8 meq/L (ref 3.5–5.1)
Sodium: 135 meq/L (ref 135–145)
Total Bilirubin: 0.5 mg/dL (ref 0.3–1.2)
Total Protein: 6 g/dL (ref 6.0–8.3)

## 2012-03-13 LAB — CBC
HCT: 33 % — ABNORMAL LOW (ref 36.0–46.0)
Hemoglobin: 11.7 g/dL — ABNORMAL LOW (ref 12.0–15.0)
MCH: 33 pg (ref 26.0–34.0)
MCHC: 35.5 g/dL (ref 30.0–36.0)
MCV: 93 fL (ref 78.0–100.0)
Platelets: 203 K/uL (ref 150–400)
RBC: 3.55 MIL/uL — ABNORMAL LOW (ref 3.87–5.11)
RDW: 12.6 % (ref 11.5–15.5)
WBC: 14 K/uL — ABNORMAL HIGH (ref 4.0–10.5)

## 2012-03-13 LAB — PROTEIN / CREATININE RATIO, URINE
Creatinine, Urine: 204.25 mg/dL
Protein Creatinine Ratio: 0.09 (ref 0.00–0.15)
Total Protein, Urine: 18.5 mg/dL

## 2012-03-13 NOTE — MAU Provider Note (Signed)
Chief Complaint:  Labor Eval   None     HPI: Robin Martin is a 32 y.o. G3P2002 at [redacted]w[redacted]d who presents to maternity admissions reporting some blurred vision when she turns her head quickly and increase in swelling in her legs and ankles.  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  She reports good fetal movement, denies LOF, vaginal bleeding, vaginal itching/burning, urinary symptoms, h/a, dizziness, n/v, or fever/chills.     Past Medical History: Past Medical History  Diagnosis Date  . Hypertension   . Herpes   . Pregnancy induced hypertension   . Cancer     LEEP-cervical cancer  . Abnormal Pap smear   . Depression     post-partum depression  . Bipolar 1 disorder     Past obstetric history: OB History   Grav Para Term Preterm Abortions TAB SAB Ect Mult Living   3 2 2  0 0 0 0 0 0 2     # Outc Date GA Lbr Len/2nd Wgt Sex Del Anes PTL Lv   1 TRM 2002 [redacted]w[redacted]d  3.629kg(8lb) M    Yes   2 TRM 2005 [redacted]w[redacted]d   M CS   Yes   3 CUR               Past Surgical History: Past Surgical History  Procedure Laterality Date  . Leep    . Cesarean section      X 1, 2nd baby    Family History: Family History  Problem Relation Age of Onset  . Diabetes Mother   . Hypertension Mother   . Mental illness Mother   . Diabetes Father   . Hypertension Father   . Asthma Sister   . Diabetes Sister   . Diabetes Maternal Grandmother     Social History: History  Substance Use Topics  . Smoking status: Current Every Day Smoker -- 0.50 packs/day    Types: Cigarettes  . Smokeless tobacco: Never Used  . Alcohol Use: No    Allergies:  Allergies  Allergen Reactions  . Chocolate Other (See Comments)    Thins blood  . Latex Rash  . Morphine And Related Itching, Nausea And Vomiting and Rash    Meds:  Prescriptions prior to admission  Medication Sig Dispense Refill  . acetaminophen (TYLENOL) 325 MG tablet Take 650 mg by mouth every 6 (six) hours as needed. Takes for pain      . calcium  carbonate-magnesium hydroxide (ROLAIDS) 334 MG CHEW Chew 2 tablets by mouth 2 (two) times daily with a meal.      . Prenatal Vit-Fe Fumarate-FA (PRENATAL MULTIVITAMIN) TABS Take 1 tablet by mouth daily.      . valACYclovir (VALTREX) 500 MG tablet Take 500 mg by mouth 2 (two) times daily.        ROS: Pertinent findings in history of present illness.  Physical Exam  Blood pressure 131/73, pulse 96, temperature 97.7 F (36.5 C), temperature source Oral, resp. rate 20, height 5\' 7"  (1.702 m), weight 120.838 kg (266 lb 6.4 oz), last menstrual period 07/09/2011, SpO2 100.00%. GENERAL: Well-developed, well-nourished female in no acute distress.  HEENT: normocephalic HEART: normal rate RESP: normal effort ABDOMEN: Soft, non-tender, gravid appropriate for gestational age EXTREMITIES: Nontender, +1 BLE, nonpitting NEURO: alert and oriented SPECULUM EXAM: Deferred     FHT:  Baseline 130 , moderate variability, accelerations present, no decelerations Contractions: irregular, mild   Labs: Results for orders placed during the hospital encounter of  03/13/12 (from the past 24 hour(s))  PROTEIN / CREATININE RATIO, URINE     Status: None   Collection Time    03/13/12  6:10 PM      Result Value Range   Creatinine, Urine 204.25     Total Protein, Urine 18.5     PROTEIN CREATININE RATIO 0.09  0.00 - 0.15  CBC     Status: Abnormal   Collection Time    03/13/12  6:23 PM      Result Value Range   WBC 14.0 (*) 4.0 - 10.5 K/uL   RBC 3.55 (*) 3.87 - 5.11 MIL/uL   Hemoglobin 11.7 (*) 12.0 - 15.0 g/dL   HCT 40.9 (*) 81.1 - 91.4 %   MCV 93.0  78.0 - 100.0 fL   MCH 33.0  26.0 - 34.0 pg   MCHC 35.5  30.0 - 36.0 g/dL   RDW 78.2  95.6 - 21.3 %   Platelets 203  150 - 400 K/uL  COMPREHENSIVE METABOLIC PANEL     Status: Abnormal   Collection Time    03/13/12  6:23 PM      Result Value Range   Sodium 135  135 - 145 mEq/L   Potassium 3.8  3.5 - 5.1 mEq/L   Chloride 104  96 - 112 mEq/L   CO2 19  19 - 32  mEq/L   Glucose, Bld 92  70 - 99 mg/dL   BUN 10  6 - 23 mg/dL   Creatinine, Ser 0.86  0.50 - 1.10 mg/dL   Calcium 9.0  8.4 - 57.8 mg/dL   Total Protein 6.0  6.0 - 8.3 g/dL   Albumin 2.7 (*) 3.5 - 5.2 g/dL   AST 10  0 - 37 U/L   ALT 6  0 - 35 U/L   Alkaline Phosphatase 116  39 - 117 U/L   Total Bilirubin 0.5  0.3 - 1.2 mg/dL   GFR calc non Af Amer >90  >90 mL/min   GFR calc Af Amer >90  >90 mL/min      Assessment: 1. Edema in pregnancy, antepartum, third trimester     Plan: Discharge home Labor precautions, preeclampsia precautions, and fetal kick counts F/U with health department as scheduled Return to MAU as needed    Medication List    ASK your doctor about these medications       acetaminophen 325 MG tablet  Commonly known as:  TYLENOL  Take 650 mg by mouth every 6 (six) hours as needed. Takes for pain     calcium carbonate-magnesium hydroxide 334 MG Chew  Commonly known as:  ROLAIDS  Chew 2 tablets by mouth 2 (two) times daily with a meal.     prenatal multivitamin Tabs  Take 1 tablet by mouth daily.     valACYclovir 500 MG tablet  Commonly known as:  VALTREX  Take 500 mg by mouth 2 (two) times daily.        Sharen Counter Certified Nurse-Midwife 03/13/2012 8:46 PM

## 2012-03-13 NOTE — MAU Note (Signed)
Patient states she is having a lot of pressure and abdominal pain since 1100. Was seen at [facility] and sent to MAU for evaluation. Has had some leg swelling and seeing "dots" Denies bleeding or leaking and reports good fetal movement.

## 2012-03-16 NOTE — MAU Provider Note (Signed)
Attestation of Attending Supervision of Advanced Practitioner (CNM/NP): Evaluation and management procedures were performed by the Advanced Practitioner under my supervision and collaboration.  I have reviewed the Advanced Practitioner's note and chart, and I agree with the management and plan.  Jissell Trafton 03/16/2012 9:32 AM   

## 2012-03-21 LAB — OB RESULTS CONSOLE GBS: GBS: POSITIVE

## 2012-03-26 ENCOUNTER — Encounter (HOSPITAL_COMMUNITY): Payer: Self-pay | Admitting: *Deleted

## 2012-03-26 ENCOUNTER — Inpatient Hospital Stay (HOSPITAL_COMMUNITY)
Admission: AD | Admit: 2012-03-26 | Discharge: 2012-03-26 | Disposition: A | Discharge status: Home / Self Care | Payer: Medicaid Other | Source: Ambulatory Visit | Attending: Family Medicine | Admitting: Family Medicine

## 2012-03-26 DIAGNOSIS — O479 False labor, unspecified: Secondary | ICD-10-CM | POA: Insufficient documentation

## 2012-03-26 DIAGNOSIS — O34219 Maternal care for unspecified type scar from previous cesarean delivery: Secondary | ICD-10-CM | POA: Insufficient documentation

## 2012-03-26 NOTE — MAU Note (Signed)
Pt C/O uc's all weekend, states they are 6 minutes apart this a.m.  Denies any leaking or bleeding.

## 2012-03-26 NOTE — MAU Provider Note (Signed)
History     CSN: [id number]  Arrival date and time: 03/26/12 0808   None     Chief Complaint  Patient presents with  . Labor Eval   HPI: 32 y/o G3P2 at [redacted]w[redacted]d presenting after onset of strong uterine contractions at 0200 this am lasting for 1-2 minutes every 4-5 mintues, described as diffuse abdominal contractions stopping the patient from what she was doing and talking.  Pt mentions contractions have since decreased in intensity since arrival for evaluation.  Pt mentions feeling continued fetal movement, denies fluid discharge, leakage, or blood, pain with urination.  Pt mentions hx of 2 weeks of mild contractions with recent cervical check 1 week at dilated to 1 cm.  Pt seen in the health department for prenatal care. Pt mentions bilateral lower extremity edema that is normal for her.   Pt denies blurry vision, double vision, shortness of breath or difficulty breathing, chest pain or RUQ pain.  Pt is scheduled for repeat LTCS at 39 weeks.   She reports good fetal movement.  Past Medical History  Diagnosis Date  . Hypertension   . Herpes   . Pregnancy induced hypertension   . Cancer     LEEP-cervical cancer  . Abnormal Pap smear   . Depression     post-partum depression  . Bipolar 1 disorder     Past Surgical History  Procedure Laterality Date  . Leep    . Cesarean section      X 1, 2nd baby    Family History  Problem Relation Age of Onset  . Diabetes Mother   . Hypertension Mother   . Mental illness Mother   . Diabetes Father   . Hypertension Father   . Asthma Sister   . Diabetes Sister   . Diabetes Maternal Grandmother     History  Substance Use Topics  . Smoking status: Current Every Day Smoker -- 0.50 packs/day    Types: Cigarettes  . Smokeless tobacco: Never Used  . Alcohol Use: No    Allergies:  Allergies  Allergen Reactions  . Chocolate Other (See Comments)    Thins blood  . Latex Rash  . Morphine And Related Itching, Nausea And Vomiting and Rash     Prescriptions prior to admission  Medication Sig Dispense Refill  . calcium carbonate-magnesium hydroxide (ROLAIDS) 334 MG CHEW Chew 2 tablets by mouth 2 (two) times daily with a meal.      . Prenatal Vit-Fe Fumarate-FA (PRENATAL MULTIVITAMIN) TABS Take 1 tablet by mouth daily.      . valACYclovir (VALTREX) 500 MG tablet Take 500 mg by mouth 2 (two) times daily.        ROS Review of Systems - Normal ROS except pt report in HPI  Physical Exam   Blood pressure 125/76, pulse 85, temperature 98.2 F (36.8 C), temperature source Oral, resp. rate 22, height 5' 7.5" (1.715 m), weight 119.296 kg (263 lb), last menstrual period 07/09/2011.  Physical Exam Physical Examination: General appearance - alert, well appearing, and in no distress, oriented to person, place, and time and normal appearing weight Chest - clear to auscultation, no wheezes, rales or rhonchi, symmetric air entry Heart - normal rate, regular rhythm, normal S1, S2, no murmurs, rubs, clicks or gallops Musculoskeletal - full range of motion without pain Extremities - pedal edema +1  Dilation: 1 Effacement (%): 40 Cervical Position: Posterior Station: Ballotable Presentation: Vertex Exam by:: Leftwich-Kirby, CNM, Linderman DO  FHR Baseline 145 + accels, - decels  moderate variability Contractions irregular every 7-12 minutes, mild to palpation.    MAU Course  Procedures   Assessment and Plan  32 y/o G3P2 at [redacted]w[redacted]d presenting with contractions and signs of labor # Contractions:  -- contractions have eased since arrivial and not increased in intensity or frequency -- VSS, PE WNL,  -- cat 1 NST tracing -- d/c to home -- pt encourage pt to stay well hydrated -- pt informed about signs of active labor and when to return for continued evaluation  Sign: Gregor Hams, DO, 03/26/12; 1000  I have seen this patient and agree with the above resident's note.  LEFTWICH-KIRBY, Arianah Torgeson 03/26/2012, 9:34 AM

## 2012-03-26 NOTE — MAU Note (Signed)
Patient states she is having contractions every 6-8 minutes. Denies bleeding or leaking and reports good fetal movement.

## 2012-04-05 NOTE — MAU Provider Note (Signed)
Attestation of Attending Supervision of Advanced Practitioner (PA/CNM/NP): Evaluation and management procedures were performed by the Advanced Practitioner under my supervision and collaboration.  I have reviewed the Advanced Practitioner's note and chart, and I agree with the management and plan.  Tomasina Keasling, MD, FACOG Attending Obstetrician & Gynecologist Faculty Practice, [facility]  

## 2012-04-06 ENCOUNTER — Encounter (HOSPITAL_COMMUNITY): Payer: Self-pay

## 2012-04-06 ENCOUNTER — Encounter: Payer: Self-pay | Admitting: Obstetrics & Gynecology

## 2012-04-06 ENCOUNTER — Encounter (HOSPITAL_COMMUNITY)
Admission: RE | Admit: 2012-04-06 | Discharge: 2012-04-06 | Disposition: A | Discharge status: Home / Self Care | Payer: Medicaid Other | Source: Ambulatory Visit | Attending: Obstetrics & Gynecology | Admitting: Obstetrics & Gynecology

## 2012-04-06 VITALS — BP 130/84 | HR 80 | Temp 97.8°F | Ht 67.5 in | Wt 268.0 lb

## 2012-04-06 DIAGNOSIS — Z6791 Unspecified blood type, Rh negative: Secondary | ICD-10-CM | POA: Insufficient documentation

## 2012-04-06 DIAGNOSIS — O26899 Other specified pregnancy related conditions, unspecified trimester: Secondary | ICD-10-CM | POA: Insufficient documentation

## 2012-04-06 DIAGNOSIS — Z302 Encounter for sterilization: Secondary | ICD-10-CM

## 2012-04-06 HISTORY — DX: Other specified pregnancy related conditions, unspecified trimester: O26.899

## 2012-04-06 HISTORY — DX: Heartburn: R12

## 2012-04-06 HISTORY — DX: Gastro-esophageal reflux disease without esophagitis: K21.9

## 2012-04-06 LAB — CBC
HCT: 33.4 % — ABNORMAL LOW (ref 36.0–46.0)
Hemoglobin: 11.7 g/dL — ABNORMAL LOW (ref 12.0–15.0)
MCH: 32.4 pg (ref 26.0–34.0)
MCHC: 35 g/dL (ref 30.0–36.0)
MCV: 92.5 fL (ref 78.0–100.0)
Platelets: 164 10*3/uL (ref 150–400)
RBC: 3.61 MIL/uL — ABNORMAL LOW (ref 3.87–5.11)
RDW: 12.8 % (ref 11.5–15.5)
WBC: 10.9 10*3/uL — ABNORMAL HIGH (ref 4.0–10.5)

## 2012-04-06 LAB — ABO/RH: ABO/RH(D): O NEG

## 2012-04-06 LAB — RPR: RPR Ser Ql: NONREACTIVE

## 2012-04-06 NOTE — Patient Instructions (Signed)
Your procedure is scheduled on:04/09/12  Enter through the Main Entrance at : 8am Pick up desk phone and dial [phone] and inform us of your arrival.  Please call [phone] if you have any problems the morning of surgery.  Remember: Do not eat or drink after midnight:Sunday   Take these meds the morning of surgery with a sip of water: Valtrex  DO NOT wear jewelry, eye make-up, lipstick,body lotion, or dark fingernail polish. Do not shave for 48 hours prior to surgery.  If you are to be admitted after surgery, leave suitcase in car until your room has been assigned. Patients discharged on the day of surgery will not be allowed to drive home.

## 2012-04-09 ENCOUNTER — Inpatient Hospital Stay (HOSPITAL_COMMUNITY): Payer: Medicaid Other | Admitting: Anesthesiology

## 2012-04-09 ENCOUNTER — Encounter (HOSPITAL_COMMUNITY)
Admission: AD | Disposition: A | Discharge status: Home / Self Care | Payer: Self-pay | Source: Ambulatory Visit | Attending: Obstetrics & Gynecology

## 2012-04-09 ENCOUNTER — Encounter (HOSPITAL_COMMUNITY): Payer: Self-pay | Admitting: Anesthesiology

## 2012-04-09 ENCOUNTER — Inpatient Hospital Stay (HOSPITAL_COMMUNITY)
Admission: AD | Admit: 2012-04-09 | Discharge: 2012-04-11 | DRG: 766 | Disposition: A | Discharge status: Home / Self Care | Payer: Medicaid Other | Source: Ambulatory Visit | Attending: Obstetrics & Gynecology | Admitting: Obstetrics & Gynecology

## 2012-04-09 DIAGNOSIS — O9989 Other specified diseases and conditions complicating pregnancy, childbirth and the puerperium: Secondary | ICD-10-CM

## 2012-04-09 DIAGNOSIS — O34219 Maternal care for unspecified type scar from previous cesarean delivery: Principal | ICD-10-CM | POA: Diagnosis present

## 2012-04-09 DIAGNOSIS — Z22330 Carrier of Group B streptococcus: Secondary | ICD-10-CM

## 2012-04-09 DIAGNOSIS — O99214 Obesity complicating childbirth: Secondary | ICD-10-CM | POA: Diagnosis present

## 2012-04-09 DIAGNOSIS — Z302 Encounter for sterilization: Secondary | ICD-10-CM

## 2012-04-09 DIAGNOSIS — E669 Obesity, unspecified: Secondary | ICD-10-CM

## 2012-04-09 DIAGNOSIS — O99892 Other specified diseases and conditions complicating childbirth: Secondary | ICD-10-CM

## 2012-04-09 LAB — PREPARE RBC (CROSSMATCH)

## 2012-04-09 SURGERY — Surgical Case
Anesthesia: Spinal | Site: Abdomen | Laterality: Bilateral | Wound class: Clean Contaminated

## 2012-04-09 MED ORDER — HYDROMORPHONE HCL PF 1 MG/ML IJ SOLN
INTRAMUSCULAR | Status: AC
  Administered 2012-04-09: 0.5 mg via INTRAVENOUS
  Filled 2012-04-09: qty 1

## 2012-04-09 MED ORDER — ONDANSETRON HCL 4 MG/2ML IJ SOLN: 4.0000 mg | Freq: Four times a day (QID) | INTRAMUSCULAR | Status: DC | PRN

## 2012-04-09 MED ORDER — OXYTOCIN 40 UNITS IN LACTATED RINGERS INFUSION - SIMPLE MED: 62.5000 mL/h | INTRAVENOUS | Status: AC

## 2012-04-09 MED ORDER — FENTANYL CITRATE 0.05 MG/ML IJ SOLN
INTRAMUSCULAR | Status: AC
  Filled 2012-04-09: qty 2

## 2012-04-09 MED ORDER — HYDROMORPHONE HCL PF 1 MG/ML IJ SOLN
0.2500 mg | INTRAMUSCULAR | Status: DC | PRN
  Administered 2012-04-09: 0.5 mg via INTRAVENOUS

## 2012-04-09 MED ORDER — NALOXONE HCL 0.4 MG/ML IJ SOLN: 0.4000 mg | INTRAMUSCULAR | Status: DC | PRN

## 2012-04-09 MED ORDER — DIPHENHYDRAMINE HCL 25 MG PO CAPS: 25.0000 mg | ORAL_CAPSULE | Freq: Four times a day (QID) | ORAL | Status: DC | PRN

## 2012-04-09 MED ORDER — FERROUS SULFATE 325 (65 FE) MG PO TABS
325.0000 mg | ORAL_TABLET | Freq: Two times a day (BID) | ORAL | Status: DC
  Administered 2012-04-09 – 2012-04-11 (×4): 325 mg via ORAL
  Filled 2012-04-09 (×4): qty 1

## 2012-04-09 MED ORDER — DIPHENHYDRAMINE HCL 50 MG/ML IJ SOLN
12.5000 mg | Freq: Four times a day (QID) | INTRAMUSCULAR | Status: DC | PRN
  Administered 2012-04-09: 12.5 mg via INTRAVENOUS
  Filled 2012-04-09: qty 1

## 2012-04-09 MED ORDER — SODIUM CHLORIDE 0.9 % IJ SOLN: 9.0000 mL | INTRAMUSCULAR | Status: DC | PRN

## 2012-04-09 MED ORDER — DIPHENHYDRAMINE HCL 50 MG/ML IJ SOLN: 12.5000 mg | Freq: Four times a day (QID) | INTRAMUSCULAR | Status: DC | PRN

## 2012-04-09 MED ORDER — SODIUM CHLORIDE 0.9 % IV SOLN: 800.0000 mg | Freq: Four times a day (QID) | INTRAVENOUS | Status: DC

## 2012-04-09 MED ORDER — SIMETHICONE 80 MG PO CHEW: 80.0000 mg | CHEWABLE_TABLET | ORAL | Status: DC | PRN

## 2012-04-09 MED ORDER — OXYTOCIN 10 UNIT/ML IJ SOLN
INTRAMUSCULAR | Status: AC
  Filled 2012-04-09: qty 4

## 2012-04-09 MED ORDER — MENTHOL 3 MG MT LOZG: 1.0000 | LOZENGE | OROMUCOSAL | Status: DC | PRN

## 2012-04-09 MED ORDER — OXYTOCIN 40 UNITS IN LACTATED RINGERS INFUSION - SIMPLE MED: 62.5000 mL/h | INTRAVENOUS | Status: DC

## 2012-04-09 MED ORDER — MEASLES, MUMPS & RUBELLA VAC ~~LOC~~ INJ: 0.5000 mL | INJECTION | Freq: Once | SUBCUTANEOUS | Status: DC

## 2012-04-09 MED ORDER — DEXAMETHASONE SODIUM PHOSPHATE 10 MG/ML IJ SOLN
INTRAMUSCULAR | Status: DC | PRN
  Administered 2012-04-09: 10 mg via INTRAVENOUS

## 2012-04-09 MED ORDER — SCOPOLAMINE 1 MG/3DAYS TD PT72
MEDICATED_PATCH | TRANSDERMAL | Status: AC
  Administered 2012-04-09: 1.5 mg via TRANSDERMAL
  Filled 2012-04-09: qty 1

## 2012-04-09 MED ORDER — PRENATAL MULTIVITAMIN CH
1.0000 | ORAL_TABLET | Freq: Every day | ORAL | Status: DC
  Administered 2012-04-10 – 2012-04-11 (×2): 1 via ORAL
  Filled 2012-04-09 (×2): qty 1

## 2012-04-09 MED ORDER — ONDANSETRON HCL 4 MG PO TABS: 4.0000 mg | ORAL_TABLET | ORAL | Status: DC | PRN

## 2012-04-09 MED ORDER — LACTATED RINGERS IV SOLN: INTRAVENOUS | Status: DC

## 2012-04-09 MED ORDER — DIBUCAINE 1 % RE OINT: 1.0000 "application " | TOPICAL_OINTMENT | RECTAL | Status: DC | PRN

## 2012-04-09 MED ORDER — BISACODYL 10 MG RE SUPP: 10.0000 mg | Freq: Every day | RECTAL | Status: DC | PRN

## 2012-04-09 MED ORDER — LACTATED RINGERS IV SOLN
INTRAVENOUS | Status: DC
  Administered 2012-04-09: 15:00:00 via INTRAVENOUS

## 2012-04-09 MED ORDER — PHENYLEPHRINE HCL 10 MG/ML IJ SOLN
INTRAMUSCULAR | Status: DC | PRN
  Administered 2012-04-09: 40 ug via INTRAVENOUS
  Administered 2012-04-09 (×2): 80 ug via INTRAVENOUS

## 2012-04-09 MED ORDER — LANOLIN HYDROUS EX OINT: 1.0000 "application " | TOPICAL_OINTMENT | CUTANEOUS | Status: DC | PRN

## 2012-04-09 MED ORDER — PRENATAL MULTIVITAMIN CH: 1.0000 | ORAL_TABLET | Freq: Every day | ORAL | Status: DC

## 2012-04-09 MED ORDER — EPHEDRINE SULFATE 50 MG/ML IJ SOLN
INTRAMUSCULAR | Status: DC | PRN
  Administered 2012-04-09 (×5): 10 mg via INTRAVENOUS

## 2012-04-09 MED ORDER — WITCH HAZEL-GLYCERIN EX PADS: 1.0000 "application " | MEDICATED_PAD | CUTANEOUS | Status: DC | PRN

## 2012-04-09 MED ORDER — LACTATED RINGERS IV SOLN
INTRAVENOUS | Status: DC
  Administered 2012-04-09 (×2): via INTRAVENOUS

## 2012-04-09 MED ORDER — IBUPROFEN 600 MG PO TABS
600.0000 mg | ORAL_TABLET | Freq: Four times a day (QID) | ORAL | Status: DC
Start: 2012-04-09 — End: 2012-04-09

## 2012-04-09 MED ORDER — HYDROMORPHONE 0.3 MG/ML IV SOLN
INTRAVENOUS | Status: DC
  Administered 2012-04-09: 13:00:00 via INTRAVENOUS
  Filled 2012-04-09: qty 25

## 2012-04-09 MED ORDER — DIPHENHYDRAMINE HCL 12.5 MG/5ML PO ELIX
12.5000 mg | ORAL_SOLUTION | Freq: Four times a day (QID) | ORAL | Status: DC | PRN
  Filled 2012-04-09: qty 5

## 2012-04-09 MED ORDER — ZOLPIDEM TARTRATE 5 MG PO TABS: 5.0000 mg | ORAL_TABLET | Freq: Every evening | ORAL | Status: DC | PRN

## 2012-04-09 MED ORDER — ONDANSETRON HCL 4 MG/2ML IJ SOLN
INTRAMUSCULAR | Status: DC | PRN
  Administered 2012-04-09: 4 mg via INTRAVENOUS

## 2012-04-09 MED ORDER — ONDANSETRON HCL 4 MG/2ML IJ SOLN: 4.0000 mg | INTRAMUSCULAR | Status: DC | PRN

## 2012-04-09 MED ORDER — HYDROMORPHONE 0.3 MG/ML IV SOLN: INTRAVENOUS | Status: DC

## 2012-04-09 MED ORDER — DIPHENHYDRAMINE HCL 12.5 MG/5ML PO ELIX: 12.5000 mg | ORAL_SOLUTION | Freq: Four times a day (QID) | ORAL | Status: DC | PRN

## 2012-04-09 MED ORDER — SIMETHICONE 80 MG PO CHEW
80.0000 mg | CHEWABLE_TABLET | Freq: Three times a day (TID) | ORAL | Status: DC
  Administered 2012-04-09 – 2012-04-11 (×7): 80 mg via ORAL

## 2012-04-09 MED ORDER — FENTANYL CITRATE 0.05 MG/ML IJ SOLN
INTRAMUSCULAR | Status: DC | PRN
  Administered 2012-04-09: 100 ug via INTRAVENOUS
  Administered 2012-04-09 (×2): 50 ug via INTRAVENOUS

## 2012-04-09 MED ORDER — SCOPOLAMINE 1 MG/3DAYS TD PT72: 1.0000 | MEDICATED_PATCH | TRANSDERMAL | Status: DC

## 2012-04-09 MED ORDER — BUPIVACAINE HCL (PF) 0.5 % IJ SOLN
INTRAMUSCULAR | Status: AC
  Filled 2012-04-09: qty 30

## 2012-04-09 MED ORDER — GLYCOPYRROLATE 0.2 MG/ML IJ SOLN
INTRAMUSCULAR | Status: DC | PRN
  Administered 2012-04-09: 0.2 mg via INTRAVENOUS

## 2012-04-09 MED ORDER — BUPIVACAINE HCL (PF) 0.5 % IJ SOLN
INTRAMUSCULAR | Status: DC | PRN
  Administered 2012-04-09: 15 mL

## 2012-04-09 MED ORDER — OXYTOCIN 10 UNIT/ML IJ SOLN
40.0000 [IU] | INTRAVENOUS | Status: DC | PRN
  Administered 2012-04-09: 40 [IU] via INTRAVENOUS

## 2012-04-09 MED ORDER — MORPHINE SULFATE 0.5 MG/ML IJ SOLN
INTRAMUSCULAR | Status: AC
  Filled 2012-04-09: qty 10

## 2012-04-09 MED ORDER — OXYCODONE-ACETAMINOPHEN 5-325 MG PO TABS
1.0000 | ORAL_TABLET | ORAL | Status: DC | PRN
  Administered 2012-04-10 – 2012-04-11 (×6): 1 via ORAL
  Filled 2012-04-09 (×7): qty 1

## 2012-04-09 MED ORDER — IBUPROFEN 600 MG PO TABS
600.0000 mg | ORAL_TABLET | Freq: Four times a day (QID) | ORAL | Status: DC
  Administered 2012-04-09 – 2012-04-11 (×8): 600 mg via ORAL
  Filled 2012-04-09 (×8): qty 1

## 2012-04-09 MED ORDER — ONDANSETRON HCL 4 MG/2ML IJ SOLN
INTRAMUSCULAR | Status: AC
  Filled 2012-04-09: qty 2

## 2012-04-09 MED ORDER — TETANUS-DIPHTH-ACELL PERTUSSIS 5-2.5-18.5 LF-MCG/0.5 IM SUSP: 0.5000 mL | Freq: Once | INTRAMUSCULAR | Status: DC

## 2012-04-09 MED ORDER — SENNOSIDES-DOCUSATE SODIUM 8.6-50 MG PO TABS
2.0000 | ORAL_TABLET | Freq: Every day | ORAL | Status: DC
  Administered 2012-04-09 – 2012-04-10 (×2): 2 via ORAL

## 2012-04-09 MED ORDER — DEXTROSE 5 % IV SOLN
3.0000 g | INTRAVENOUS | Status: AC
  Administered 2012-04-09: 3 g via INTRAVENOUS
  Filled 2012-04-09: qty 3000

## 2012-04-09 MED ORDER — FLEET ENEMA 7-19 GM/118ML RE ENEM: 1.0000 | ENEMA | Freq: Every day | RECTAL | Status: DC | PRN

## 2012-04-09 MED ORDER — HYDROMORPHONE 0.3 MG/ML IV SOLN
INTRAVENOUS | Status: DC
  Administered 2012-04-10: 0.2 mg via INTRAVENOUS
  Administered 2012-04-10: 0.599 mg via INTRAVENOUS
  Administered 2012-04-10: 0.6 mg via INTRAVENOUS

## 2012-04-09 SURGICAL SUPPLY — 31 items
BENZOIN TINCTURE PRP APPL 2/3 (GAUZE/BANDAGES/DRESSINGS) ×2 IMPLANT
CLOTH BEACON ORANGE TIMEOUT ST (SAFETY) ×2 IMPLANT
CONTAINER PREFILL 10% NBF 15ML (MISCELLANEOUS) IMPLANT
DRAIN JACKSON PRT FLT 7MM (DRAIN) IMPLANT
DRAPE LG THREE QUARTER DISP (DRAPES) ×2 IMPLANT
DRSG OPSITE POSTOP 4X10 (GAUZE/BANDAGES/DRESSINGS) ×2 IMPLANT
DURAPREP 26ML APPLICATOR (WOUND CARE) ×2 IMPLANT
ELECT REM PT RETURN 9FT ADLT (ELECTROSURGICAL) ×2
ELECTRODE REM PT RTRN 9FT ADLT (ELECTROSURGICAL) ×1 IMPLANT
EVACUATOR SILICONE 100CC (DRAIN) IMPLANT
EXTRACTOR VACUUM M CUP 4 TUBE (SUCTIONS) ×2 IMPLANT
GLOVE BIO SURGEON STRL SZ7 (GLOVE) ×2 IMPLANT
GLOVE BIOGEL PI IND STRL 7.0 (GLOVE) ×1 IMPLANT
GLOVE BIOGEL PI INDICATOR 7.0 (GLOVE) ×1
GOWN STRL REIN XL XLG (GOWN DISPOSABLE) ×4 IMPLANT
KIT ABG SYR 3ML LUER SLIP (SYRINGE) IMPLANT
NEEDLE HYPO 25X5/8 SAFETYGLIDE (NEEDLE) ×2 IMPLANT
NS IRRIG 1000ML POUR BTL (IV SOLUTION) ×2 IMPLANT
PACK C SECTION WH (CUSTOM PROCEDURE TRAY) ×2 IMPLANT
PAD OB MATERNITY 4.3X12.25 (PERSONAL CARE ITEMS) ×2 IMPLANT
RTRCTR C-SECT PINK 25CM LRG (MISCELLANEOUS) ×2 IMPLANT
SLEEVE SCD COMPRESS KNEE MED (MISCELLANEOUS) IMPLANT
STAPLER VISISTAT 35W (STAPLE) IMPLANT
STRIP CLOSURE SKIN 1/2X4 (GAUZE/BANDAGES/DRESSINGS) ×2 IMPLANT
SUT PLAIN 2 0 XLH (SUTURE) ×2 IMPLANT
SUT VIC AB 0 CTX 36 (SUTURE) ×5
SUT VIC AB 0 CTX36XBRD ANBCTRL (SUTURE) ×5 IMPLANT
SUT VIC AB 4-0 KS 27 (SUTURE) IMPLANT
TOWEL OR 17X24 6PK STRL BLUE (TOWEL DISPOSABLE) ×6 IMPLANT
TRAY FOLEY CATH 14FR (SET/KITS/TRAYS/PACK) ×2 IMPLANT
WATER STERILE IRR 1000ML POUR (IV SOLUTION) IMPLANT

## 2012-04-09 NOTE — Op Note (Signed)
Robin Martin PROCEDURE DATE: 04/09/2012  PREOPERATIVE DIAGNOSIS: Intrauterine pregnancy at  [redacted]w[redacted]d weeks gestation for elective repeat cesarean section  POSTOPERATIVE DIAGNOSIS: The same  PROCEDURE:    Low Transverse Cesarean Section  SURGEON:  Dr. Elsie Lincoln  ASSISTANT: Dr. Napoleon Form  INDICATIONS: Robin Martin is a 32 y.o. G3P3003 at [redacted]w[redacted]d scheduled for cesarean section for elective repeat.  The risks of cesarean section discussed with the patient included but were not limited to: bleeding which may require transfusion or reoperation; infection which may require antibiotics; injury to bowel, bladder, ureters or other surrounding organs; injury to the fetus; need for additional procedures including hysterectomy in the event of a life-threatening hemorrhage; placental abnormalities wth subsequent pregnancies, incisional problems, thromboembolic phenomenon and other postoperative/anesthesia complications. The patient concurred with the proposed plan, giving informed written consent for the procedure.    FINDINGS:  Viable female infant in cephalic presentation, Apgars per neonatal team (see nicu note), wt pending due to skin to skin clear amniotic fluid.  Intact placenta, three vessel cord.  Grossly normal uterus, ovaries and fallopian tubes. Small amount of scar tissue,  .   ANESTHESIA:    Epidural ESTIMATED BLOOD LOSS: 800 ml SPECIMENS: Placenta sent to L&D COMPLICATIONS: None immediate  PROCEDURE IN DETAIL:  The patient received intravenous antibiotics and had sequential compression devices applied to her lower extremities while in the preoperative area.  She was then taken to the operating room where spinal anesthesia was administered and was found to be adequate. She was then placed in a dorsal supine position with a leftward tilt, and prepped and draped in a sterile manner.  A foley catheter was placed into her bladder and attached to constant gravity.  After an adequate timeout was  performed, a Pfannenstiel skin incision was made with scalpel and carried through to the underlying layer of fascia. The fascia was incised in the midline and this incision was extended bilaterally using the Mayo scissors. Kocher clamps were applied to the superior aspect of the fascial incision and the underlying rectus muscles were dissected off bluntly. A similar process was carried out on the inferior aspect of the facial incision. The rectus muscles were separated in the midline bluntly and the peritoneum was entered bluntly.   A transverse hysterotomy was made with a scalpel and extended bilaterally bluntly. The bladder blade was then removed. The infant was successfully delivered, and cord was clamped and cut and infant was handed over to awaiting neonatology team. Uterine massage was then administered and the placenta delivered intact with three-vessel cord. The uterus was cleared of clot and debris.  The hysterotomy was closed with 0 vicryl with good hemostasis and restoration of anatomy.  The peritoneum and rectus muscles were noted to be hemostatic.  The fascia was closed with 0-Vicryl in a running fashion with good restoration of anatomy.  The subcutaneus tissue was copiously irrigated.  The subcutaneous tissue was closed with 0 pain.  The skin was closed with 4-0 Vicryl is a subcuticular fashion..  Pt tolerated the procedure will.  All counts were correct x2.  Pt went to the recovery room in stable condition.

## 2012-04-09 NOTE — H&P (Signed)
Robin Martin is a 32 y.o. female presenting for Elective Rpt Cesarean Section.  Pt is GBS positive.  She hs received ancef IV for surgical site infection prophylaxis.  Pt hs been contracting but does not appear to be in labor.  Pt has history of cardiac arrest due to post partum hemorrhage after vaginal delivery.  Blood is typed and crossed at this time.  History OB History   Grav Para Term Preterm Abortions TAB SAB Ect Mult Living   3 2 2  0 0 0 0 0 0 2     Past Medical History  Diagnosis Date  . Hypertension   . Herpes   . Pregnancy induced hypertension   . Cancer     LEEP-cervical cancer  . Abnormal Pap smear   . Depression     post-partum depression  . Bipolar 1 disorder   . Heartburn in pregnancy   . GERD (gastroesophageal reflux disease)     h/o GERD- wt loss alleviated it   Past Surgical History  Procedure Laterality Date  . Leep    . Cesarean section      X 1, 2nd baby   Family History: family history includes Asthma in her sister; Diabetes in her father, maternal grandmother, mother, and sister; Hypertension in her father and mother; and Mental illness in her mother. Social History:  reports that she quit smoking about 2 weeks ago. Her smoking use included Cigarettes. She smoked 0.50 packs per day. She has never used smokeless tobacco. She reports that she does not drink alcohol or use illicit drugs.   Prenatal Transfer Tool  Maternal Diabetes: No Genetic Screening: Normal Maternal Ultrasounds/Referrals: Normal Fetal Ultrasounds or other Referrals:  None Maternal Substance Abuse:  No Significant Maternal Medications:  None Significant Maternal Lab Results:  Lab values include: Group B Strep positive Other Comments:  None  ROS    Blood pressure 137/97, pulse 88, temperature 98.4 F (36.9 C), temperature source Oral, resp. rate 20, last menstrual period 07/09/2011, SpO2 100.00%. Exam Physical Exam  Vitals reviewed. Constitutional: She is oriented to person,  place, and time. She appears well-developed and well-nourished. No distress.  HENT:  Head: Normocephalic and atraumatic.  Eyes: Conjunctivae are normal.  Neck: No thyromegaly present.  Cardiovascular: Normal rate and regular rhythm.   Respiratory: Breath sounds normal.  GI: Soft. She exhibits no distension. There is no tenderness.  Musculoskeletal: She exhibits no edema.  Neurological: She is alert and oriented to person, place, and time.  Skin: Skin is warm and dry.  Psychiatric: She has a normal mood and affect.    Prenatal labs: ABO, Rh: --/--/O NEG (03/14 1015) Antibody: NEG (03/14 1011) Rubella: Immune (09/04 0000) RPR: NON REACTIVE (03/14 1009)  HBsAg: Negative (09/04 0000)  HIV: Non-reactive (09/04 0000)  GBS: Positive (02/26 0000)   Assessment/Plan: 88 P2 female with one prior c/s for elective rpt c/s.  1-obestiy--panus taped during surgery 2-pph risk--blood available 3-GBS positive ? Early labor--IV antibiotics currently being administered   The risks of cesarean section discussed with the patient included but were not limited to: bleeding which may require transfusion or reoperation; infection which may require antibiotics; injury to bowel, bladder, ureters or other surrounding organs; injury to the fetus; need for additional procedures including hysterectomy in the event of a life-threatening hemorrhage; placental abnormalities wth subsequent pregnancies, incisional problems, thromboembolic phenomenon and other postoperative/anesthesia complications. The patient concurred with the proposed plan, giving informed written consent for the procedure.    Antinio Sanderfer H.  04/09/2012, 9:20 AM

## 2012-04-09 NOTE — Transfer of Care (Signed)
Immediate Anesthesia Transfer of Care Note  Patient: Robin Martin  Procedure(s) Performed: Procedure(s) with comments: REPEAT CESAREAN SECTION  (Bilateral) - Repeat  Patient Location: PACU  Anesthesia Type:Spinal  Level of Consciousness: awake, alert  and oriented  Airway & Oxygen Therapy: Patient Spontanous Breathing  Post-op Assessment: Report given to PACU RN and Post -op Vital signs reviewed and stable  Post vital signs: Reviewed and stable  Complications: No apparent anesthesia complications

## 2012-04-09 NOTE — Anesthesia Postprocedure Evaluation (Signed)
  Anesthesia Post-op Note  Patient: Catering manager  Procedure(s) Performed: Procedure(s) with comments: REPEAT CESAREAN SECTION  (Bilateral) - Repeat   Patient is awake, responsive, moving her legs, and has signs of resolution of her numbness. Pain and nausea are reasonably well controlled. Vital signs are stable and clinically acceptable. Oxygen saturation is clinically acceptable. There are no apparent anesthetic complications at this time. Patient is ready for discharge.

## 2012-04-09 NOTE — Anesthesia Preprocedure Evaluation (Addendum)
Anesthesia Evaluation  Patient identified by MRN, date of birth, ID band Patient awake    Reviewed: Allergy & Precautions, H&P , Patient's Chart, lab work & pertinent test results  Airway Mallampati: IV TM Distance: >3 FB Neck ROM: full    Dental no notable dental hx.    Pulmonary  breath sounds clear to auscultation  Pulmonary exam normal       Cardiovascular Exercise Tolerance: Good Rhythm:regular Rate:Normal     Neuro/Psych    GI/Hepatic GERD-  Medicated and Controlled,  Endo/Other  Morbid obesity  Renal/GU      Musculoskeletal   Abdominal   Peds  Hematology   Anesthesia Other Findings   Reproductive/Obstetrics                          Anesthesia Physical Anesthesia Plan  ASA: III  Anesthesia Plan: Spinal   Post-op Pain Management:    Induction:   Airway Management Planned:   Additional Equipment:   Intra-op Plan:   Post-operative Plan:   Informed Consent: I have reviewed the patients History and Physical, chart, labs and discussed the procedure including the risks, benefits and alternatives for the proposed anesthesia with the patient or authorized representative who has indicated his/her understanding and acceptance.   Dental Advisory Given  Plan Discussed with: CRNA  Anesthesia Plan Comments: (MSO4 allergy; will use PCA Lab work confirmed with CRNA in room. Platelets okay. Discussed spinal anesthetic, and patient consents to the procedure:  included risk of possible headache,backache, failed block, allergic reaction, and nerve injury. This patient was asked if she had any questions or concerns before the procedure started. )       Anesthesia Quick Evaluation

## 2012-04-09 NOTE — Consult Note (Signed)
Neonatology Note:   Attendance at C-section:    I was asked by Dr. Penne Lash to attend this repeat C/S at term. The mother is a G3P2 O neg, GBS pos with  A history of bipolar disorder, depression, and hypertension. ROM at delivery, fluid clear. Infant vigorous with good spontaneous cry and tone. Needed only minimal bulb suctioning. Ap 8/9. Baby appears LGA. Lungs clear to ausc in DR. To CN to care of Pediatrician.   Doretha Sou, MD

## 2012-04-09 NOTE — Anesthesia Procedure Notes (Signed)

## 2012-04-10 ENCOUNTER — Encounter (HOSPITAL_COMMUNITY): Payer: Self-pay | Admitting: Obstetrics & Gynecology

## 2012-04-10 LAB — CBC
HCT: 29 % — ABNORMAL LOW (ref 36.0–46.0)
Hemoglobin: 10.1 g/dL — ABNORMAL LOW (ref 12.0–15.0)
MCH: 32.6 pg (ref 26.0–34.0)
MCHC: 34.8 g/dL (ref 30.0–36.0)
MCV: 93.5 fL (ref 78.0–100.0)
Platelets: 162 10*3/uL (ref 150–400)
RBC: 3.1 MIL/uL — ABNORMAL LOW (ref 3.87–5.11)
RDW: 13.1 % (ref 11.5–15.5)
WBC: 16.4 10*3/uL — ABNORMAL HIGH (ref 4.0–10.5)

## 2012-04-10 LAB — TYPE AND SCREEN
ABO/RH(D): O NEG
Antibody Screen: NEGATIVE
Unit division: 0
Unit division: 0

## 2012-04-10 LAB — BIRTH TISSUE RECOVERY COLLECTION (PLACENTA DONATION)

## 2012-04-10 NOTE — Progress Notes (Signed)
Subjective: Postpartum Day 1: Cesarean Delivery Patient reports no problems voiding.  No BM, flatus. Up and moving around. No longer needs PCA, ok with orals. Reporting pain in R side and shoulder. No lightheadedness.   Objective: Vital signs in last 24 hours: Temp:  [97.6 F (36.4 C)-98.9 F (37.2 C)] 97.9 F (36.6 C) (03/18 0600) Pulse Rate:  [61-95] 70 (03/18 0600) Resp:  [16-20] 20 (03/18 0600) BP: (107-144)/(61-97) 108/63 mmHg (03/18 0600) SpO2:  [92 %-100 %] 95 % (03/18 0600)  Physical Exam:  General: alert, cooperative, appears stated age and no distress Lochia: appropriate Uterine Fundus: firm Incision: no significant drainage DVT Evaluation: No evidence of DVT seen on physical exam.   Recent Labs  04/10/12 0625  HGB 10.1*  HCT 29.0*    Assessment/Plan: Status post Cesarean section. Doing well postoperatively.  D/C PCA, start PO pain  meds.  Lorna Dibble 04/10/2012, 7:52 AM

## 2012-04-10 NOTE — Progress Notes (Signed)
UR chart review completed.  

## 2012-04-11 LAB — URINE MICROSCOPIC-ADD ON

## 2012-04-11 LAB — URINALYSIS, ROUTINE W REFLEX MICROSCOPIC
Bilirubin Urine: NEGATIVE
Bilirubin Urine: NEGATIVE
Glucose, UA: NEGATIVE mg/dL
Glucose, UA: NEGATIVE mg/dL
Hgb urine dipstick: NEGATIVE
Ketones, ur: NEGATIVE mg/dL
Ketones, ur: NEGATIVE mg/dL
Leukocytes, UA: NEGATIVE
Leukocytes, UA: NEGATIVE
Nitrite: NEGATIVE
Nitrite: NEGATIVE
Protein, ur: NEGATIVE mg/dL
Protein, ur: NEGATIVE mg/dL
Specific Gravity, Urine: 1.025 (ref 1.005–1.030)
Specific Gravity, Urine: >1.03 — ABNORMAL HIGH (ref 1.005–1.030)
Urobilinogen, UA: 0.2 mg/dL (ref 0.0–1.0)
Urobilinogen, UA: 0.2 mg/dL (ref 0.0–1.0)
pH: 6 (ref 5.0–8.0)
pH: 6 (ref 5.0–8.0)

## 2012-04-11 MED ORDER — OXYCODONE-ACETAMINOPHEN 5-325 MG PO TABS: 1.0000 | ORAL_TABLET | ORAL | Status: DC | PRN

## 2012-04-11 MED ORDER — IBUPROFEN 600 MG PO TABS: 600.0000 mg | ORAL_TABLET | Freq: Four times a day (QID) | ORAL | Status: DC

## 2012-04-11 MED ORDER — NORETHINDRONE 0.35 MG PO TABS: 1.0000 | ORAL_TABLET | Freq: Every day | ORAL | Status: DC

## 2012-04-11 MED ORDER — DOCUSATE SODIUM 100 MG PO CAPS: 100.0000 mg | ORAL_CAPSULE | Freq: Two times a day (BID) | ORAL | Status: DC | PRN

## 2012-04-11 MED ORDER — FERROUS SULFATE 325 (65 FE) MG PO TABS: 325.0000 mg | ORAL_TABLET | Freq: Two times a day (BID) | ORAL | Status: DC

## 2012-04-11 NOTE — Progress Notes (Signed)
I saw and examined patient and agree with above student note. Kaianna Dolezal, MD 

## 2012-04-11 NOTE — Discharge Summary (Signed)
Obstetric Discharge Summary Reason for Admission: cesarean section Prenatal Procedures: none Intrapartum Procedures: cesarean: low cervical, transverse Postpartum Procedures: none Complications-Operative and Postpartum: none Hemoglobin  Date Value Range Status  04/10/2012 10.1* 12.0 - 15.0 g/dL Final     HCT  Date Value Range Status  04/10/2012 29.0* 36.0 - 46.0 % Final    Physical Exam:  General: alert, cooperative and no distress Lochia: appropriate Uterine Fundus: firm Incision: no significant drainage, no dehiscence, no significant erythema DVT Evaluation: No evidence of DVT seen on physical exam. Negative Homan's sign. No cords or calf tenderness.  Discharge Diagnoses: Term Pregnancy-delivered, RLTCS  Discharge Information: Date: 04/11/2012 Activity: unrestricted Diet: routine Medications: PNV, Ibuprofen, Iron and Percocet Condition: stable Instructions: refer to practice specific booklet Discharge to: home Contraception: Mini-pill Breastfeeding   Newborn Data: Live born female  Birth Weight: 9 lb 15.1 oz (4510 g) APGAR: 8, 9  Home with mother.  Kevin Fenton 04/11/2012, 7:46 AM  I have seen and examined this patient and I agree with the above. Cam Hai 9:21 AM 04/11/2012

## 2012-04-11 NOTE — Discharge Summary (Signed)
Attestation of Attending Supervision of Advanced Practitioner (PA/CNM/NP): Evaluation and management procedures were performed by the Advanced Practitioner under my supervision and collaboration.  I have reviewed the Advanced Practitioner's note and chart, and I agree with the management and plan.  Malaisha Silliman, MD, FACOG Attending Obstetrician & Gynecologist Faculty Practice, [facility]  
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
## 2012-05-10 ENCOUNTER — Encounter (HOSPITAL_COMMUNITY): Payer: Self-pay | Admitting: *Deleted

## 2012-05-10 ENCOUNTER — Other Ambulatory Visit: Payer: Self-pay | Admitting: Family

## 2012-05-10 ENCOUNTER — Inpatient Hospital Stay (HOSPITAL_COMMUNITY)
Admission: AD | Admit: 2012-05-10 | Discharge: 2012-05-10 | Disposition: A | Discharge status: Home / Self Care | Payer: Medicaid Other | Source: Ambulatory Visit | Attending: Obstetrics & Gynecology | Admitting: Obstetrics & Gynecology

## 2012-05-10 DIAGNOSIS — N390 Urinary tract infection, site not specified: Secondary | ICD-10-CM | POA: Insufficient documentation

## 2012-05-10 DIAGNOSIS — O2390 Unspecified genitourinary tract infection in pregnancy, unspecified trimester: Secondary | ICD-10-CM | POA: Insufficient documentation

## 2012-05-10 DIAGNOSIS — N949 Unspecified condition associated with female genital organs and menstrual cycle: Secondary | ICD-10-CM | POA: Insufficient documentation

## 2012-05-10 DIAGNOSIS — O909 Complication of the puerperium, unspecified: Secondary | ICD-10-CM | POA: Insufficient documentation

## 2012-05-10 DIAGNOSIS — G8918 Other acute postprocedural pain: Secondary | ICD-10-CM

## 2012-05-10 HISTORY — DX: Chronic kidney disease, unspecified: N18.9

## 2012-05-10 HISTORY — DX: Unspecified ovarian cyst, unspecified side: N83.209

## 2012-05-10 LAB — URINALYSIS, ROUTINE W REFLEX MICROSCOPIC
Bilirubin Urine: NEGATIVE
Glucose, UA: NEGATIVE mg/dL
Ketones, ur: NEGATIVE mg/dL
Leukocytes, UA: NEGATIVE
Nitrite: POSITIVE — AB
Protein, ur: NEGATIVE mg/dL
Specific Gravity, Urine: 1.02 (ref 1.005–1.030)
Urobilinogen, UA: 0.2 mg/dL (ref 0.0–1.0)
pH: 7 (ref 5.0–8.0)

## 2012-05-10 LAB — URINE MICROSCOPIC-ADD ON

## 2012-05-10 MED ORDER — SULFAMETHOXAZOLE-TRIMETHOPRIM 800-160 MG PO TABS: 1.0000 | ORAL_TABLET | Freq: Two times a day (BID) | ORAL | Status: DC

## 2012-05-10 MED ORDER — KETOROLAC TROMETHAMINE 60 MG/2ML IM SOLN
60.0000 mg | Freq: Once | INTRAMUSCULAR | Status: AC
  Administered 2012-05-10: 60 mg via INTRAMUSCULAR
  Filled 2012-05-10: qty 2

## 2012-05-10 MED ORDER — CEPHALEXIN 500 MG PO CAPS: 500.0000 mg | ORAL_CAPSULE | Freq: Three times a day (TID) | ORAL | Status: AC

## 2012-05-10 MED ORDER — IBUPROFEN 600 MG PO TABS: 600.0000 mg | ORAL_TABLET | Freq: Once | ORAL | Status: DC

## 2012-05-10 NOTE — MAU Provider Note (Signed)
Attestation of Attending Supervision of Advanced Practitioner (PA/CNM/NP): Evaluation and management procedures were performed by the Advanced Practitioner under my supervision and collaboration.  I have reviewed the Advanced Practitioner's note and chart, and I agree with the management and plan.  Shi Grose, MD, FACOG Attending Obstetrician & Gynecologist Faculty Practice, [facility]  

## 2012-05-10 NOTE — MAU Note (Signed)
Pt states here for mid back pain, +CVA tenderness on r side only. Burns with voiding, strong odor noted. Also c/s incision is tender and red on pt's r side at end of incision.

## 2012-05-10 NOTE — MAU Note (Signed)
Deliver on the 17th, went back to work yesterday, knot at incision site first noted yesterday afternoon, small amt of bloody discharge from it.  Back has been hurting off an on since deliver - "locks up". Noted an odor with urine during the night.

## 2012-05-10 NOTE — Progress Notes (Signed)
Pt called to report that Bactrim was not covered under her insurance and she did not have $4 to pay for it at [facility].  Requested a different medication called in.  RX Keflex called in to [facility] on [address].

## 2012-05-10 NOTE — MAU Provider Note (Signed)
History     CSN: [id number]  Arrival date and time: 05/10/12 1206   First Provider Initiated Contact with Patient 05/10/12 1251      Chief Complaint  Patient presents with  . Back Pain  . Urinary Tract Infection  . Wound Check   HPI  Pt is a G3P3003 here status post a repeat csection on 04/09/12.  Postpartum course uncomplicated.  Started back at work yesterday. 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  Reports pain on right side of incision and felt like it something pop.  Slight bleeding from incision site, none today.  No report of fever, body aches or chills.  Also reports lower mid-back pain since discharge from hospital.  No blood in urine and normal bowel movements.  Uterine bleeding has stopped.    Past Medical History  Diagnosis Date  . Hypertension   . Herpes   . Pregnancy induced hypertension   . Abnormal Pap smear   . Depression     post-partum depression  . Bipolar 1 disorder   . Heartburn in pregnancy   . GERD (gastroesophageal reflux disease)     h/o GERD- wt loss alleviated it  . Infection     urinary tract infection  . Chronic kidney disease     kidney stone at age 53  . Ovarian cyst     age 48  . Cancer 2005    LEEP-cervical cancer    Past Surgical History  Procedure Laterality Date  . Leep    . Cesarean section      X 1, 2nd baby  . Cesarean section with bilateral tubal ligation Bilateral 04/09/2012    Procedure: REPEAT CESAREAN SECTION ;  Surgeon: Lesly Dukes, MD;  Location: [facility];  Service: Obstetrics;  Laterality: Bilateral;  Repeat    Family History  Problem Relation Age of Onset  . Diabetes Mother   . Hypertension Mother   . Mental illness Mother   . Diabetes Father   . Hypertension Father   . Asthma Sister   . Diabetes Sister   . Diabetes Maternal Grandmother     History  Substance Use Topics  . Smoking status: Former Smoker -- 0.50 packs/day    Types: Cigarettes    Quit  date: 03/22/2012  . Smokeless tobacco: Never Used  . Alcohol Use: No    Allergies:  Allergies  Allergen Reactions  . Chocolate Other (See Comments)    Thins blood  . Latex Rash  . Morphine And Related Itching, Nausea And Vomiting and Rash    Prescriptions prior to admission  Medication Sig Dispense Refill  . calcium carbonate-magnesium hydroxide (ROLAIDS) 334 MG CHEW Chew 2 tablets by mouth 2 (two) times daily with a meal.      . docusate sodium (COLACE) 100 MG capsule Take 1 capsule (100 mg total) by mouth 2 (two) times daily as needed for constipation.  60 capsule  2  . ferrous sulfate 325 (65 FE) MG tablet Take 1 tablet (325 mg total) by mouth 2 (two) times daily with a meal.  60 tablet  2  . ibuprofen (ADVIL,MOTRIN) 600 MG tablet Take 1 tablet (600 mg total) by mouth every 6 (six) hours.  50 tablet  1  . norethindrone (ORTHO MICRONOR) 0.35 MG tablet Take 1 tablet (0.35 mg total) by mouth daily.  1 Package  11  . oxyCODONE-acetaminophen (PERCOCET/ROXICET) 5-325 MG per tablet Take 1 tablet  by mouth every 4 (four) hours as needed.  50 tablet  0  . Prenatal Vit-Fe Fumarate-FA (PRENATAL MULTIVITAMIN) TABS Take 1 tablet by mouth daily.      . valACYclovir (VALTREX) 500 MG tablet Take 500 mg by mouth 2 (two) times daily.        Review of Systems  Gastrointestinal: Positive for abdominal pain (incisional pain and cramping).  Musculoskeletal: Positive for back pain.  All other systems reviewed and are negative.   Physical Exam   Blood pressure 147/89, pulse 61, temperature 98.3 F (36.8 C), temperature source Oral, resp. rate 20, currently breastfeeding.  Physical Exam  Constitutional: She is oriented to person, place, and time. She appears well-developed and well-nourished. No distress.  HENT:  Head: Normocephalic.  Neck: Normal range of motion. Neck supple.  Cardiovascular: Normal rate and regular rhythm.   Respiratory: Effort normal and breath sounds normal.  GI: Soft. There  is tenderness (mild tenderness around right side of incision). There is no guarding.  Incision site healing well; well approximated,slight area of redness of right border of incision; small 1cm superficial area of hardening on right side of incision.    Genitourinary: No bleeding around the vagina.  Neurological: She is alert and oriented to person, place, and time. She has normal reflexes.  Skin: Skin is warm and dry.    MAU Course  Procedures Results for orders placed during the hospital encounter of 05/10/12 (from the past 24 hour(s))  URINALYSIS, ROUTINE W REFLEX MICROSCOPIC     Status: Abnormal   Collection Time    05/10/12 12:30 PM      Result Value Range   Color, Urine YELLOW  YELLOW   APPearance CLOUDY (*) CLEAR   Specific Gravity, Urine 1.020  1.005 - 1.030   pH 7.0  5.0 - 8.0   Glucose, UA NEGATIVE  NEGATIVE mg/dL   Hgb urine dipstick TRACE (*) NEGATIVE   Bilirubin Urine NEGATIVE  NEGATIVE   Ketones, ur NEGATIVE  NEGATIVE mg/dL   Protein, ur NEGATIVE  NEGATIVE mg/dL   Urobilinogen, UA 0.2  0.0 - 1.0 mg/dL   Nitrite POSITIVE (*) NEGATIVE   Leukocytes, UA NEGATIVE  NEGATIVE  URINE MICROSCOPIC-ADD ON     Status: Abnormal   Collection Time    05/10/12 12:30 PM      Result Value Range   Squamous Epithelial / LPF FEW (*) RARE   WBC, UA 21-50  <3 WBC/hpf   RBC / HPF 0-2  <3 RBC/hpf   Bacteria, UA MANY (*) RARE     Assessment and Plan  UTI Incisional Pain  Plan: Advised pt to refrain from working until evaluated at postpartum appt on 05/24/12.  RX Bactrim DS x 7 days. Urine culture Reviewed signs of infection and what to report  [facility] 05/10/2012, 12:54 PM

## 2012-05-10 NOTE — MAU Note (Addendum)
Had c/s March 17.  Has a "knot" on rt side of incision. Thinks she has a UTI, and is having back pain.  Pt needed to use restroom. Name and DOB verified.

## 2012-05-12 LAB — URINE CULTURE: Colony Count: >=100000

## 2012-07-01 ENCOUNTER — Encounter (HOSPITAL_COMMUNITY): Payer: Self-pay

## 2012-07-01 ENCOUNTER — Emergency Department (HOSPITAL_COMMUNITY): Payer: Medicaid Other

## 2012-07-01 ENCOUNTER — Emergency Department (HOSPITAL_COMMUNITY)
Admission: EM | Admit: 2012-07-01 | Discharge: 2012-07-02 | Disposition: A | Discharge status: Home / Self Care | Payer: Medicaid Other | Attending: Emergency Medicine | Admitting: Emergency Medicine

## 2012-07-01 DIAGNOSIS — Z8742 Personal history of other diseases of the female genital tract: Secondary | ICD-10-CM | POA: Insufficient documentation

## 2012-07-01 DIAGNOSIS — Z885 Allergy status to narcotic agent status: Secondary | ICD-10-CM | POA: Insufficient documentation

## 2012-07-01 DIAGNOSIS — N189 Chronic kidney disease, unspecified: Secondary | ICD-10-CM | POA: Insufficient documentation

## 2012-07-01 DIAGNOSIS — Z87891 Personal history of nicotine dependence: Secondary | ICD-10-CM | POA: Insufficient documentation

## 2012-07-01 DIAGNOSIS — Y92009 Unspecified place in unspecified non-institutional (private) residence as the place of occurrence of the external cause: Secondary | ICD-10-CM | POA: Insufficient documentation

## 2012-07-01 DIAGNOSIS — S8992XA Unspecified injury of left lower leg, initial encounter: Secondary | ICD-10-CM

## 2012-07-01 DIAGNOSIS — Z8619 Personal history of other infectious and parasitic diseases: Secondary | ICD-10-CM | POA: Insufficient documentation

## 2012-07-01 DIAGNOSIS — K219 Gastro-esophageal reflux disease without esophagitis: Secondary | ICD-10-CM | POA: Insufficient documentation

## 2012-07-01 DIAGNOSIS — R109 Unspecified abdominal pain: Secondary | ICD-10-CM | POA: Insufficient documentation

## 2012-07-01 DIAGNOSIS — Z8541 Personal history of malignant neoplasm of cervix uteri: Secondary | ICD-10-CM | POA: Insufficient documentation

## 2012-07-01 DIAGNOSIS — W010XXA Fall on same level from slipping, tripping and stumbling without subsequent striking against object, initial encounter: Secondary | ICD-10-CM | POA: Insufficient documentation

## 2012-07-01 DIAGNOSIS — Z91040 Latex allergy status: Secondary | ICD-10-CM | POA: Insufficient documentation

## 2012-07-01 DIAGNOSIS — I129 Hypertensive chronic kidney disease with stage 1 through stage 4 chronic kidney disease, or unspecified chronic kidney disease: Secondary | ICD-10-CM | POA: Insufficient documentation

## 2012-07-01 DIAGNOSIS — W19XXXA Unspecified fall, initial encounter: Secondary | ICD-10-CM

## 2012-07-01 DIAGNOSIS — F319 Bipolar disorder, unspecified: Secondary | ICD-10-CM | POA: Insufficient documentation

## 2012-07-01 DIAGNOSIS — S8990XA Unspecified injury of unspecified lower leg, initial encounter: Secondary | ICD-10-CM | POA: Insufficient documentation

## 2012-07-01 DIAGNOSIS — Y998 Other external cause status: Secondary | ICD-10-CM | POA: Insufficient documentation

## 2012-07-01 MED ORDER — IBUPROFEN 800 MG PO TABS
800.0000 mg | ORAL_TABLET | Freq: Once | ORAL | Status: AC
  Administered 2012-07-01: 800 mg via ORAL
  Filled 2012-07-01: qty 1

## 2012-07-01 NOTE — ED Notes (Signed)
Pt with child in peds 

## 2012-07-01 NOTE — ED Notes (Signed)
Patient tripped over infant bouncy seat at home. Hit doorknob on left abdomen. Did NOT hit head, NO LOC. Ambulatory post fall. Patient endorses pain in left knee with "knee catching". NAD Patient in Pediatric ED with child from same event. Resting on stretcher. Endorses 8/10 pain in left knee and left abdomen. 5cm round bruise noted to left lower abdomen inferior to waistline. Abrasion noted to left knee

## 2012-07-01 NOTE — ED Notes (Signed)
Pt states while caring baby tripped over baby bouncer and fell. Pt c/o pain to left knee all the way up to hip. NO LOC. No obvious deformity

## 2012-07-02 MED ORDER — NAPROXEN 500 MG PO TABS: 500.0000 mg | ORAL_TABLET | Freq: Two times a day (BID) | ORAL | Status: DC

## 2012-07-02 NOTE — Progress Notes (Signed)
Orthopedic Tech Progress Note Patient Details:  Robin Martin Dec 14, 1980 [id number]  Ortho Devices Type of Ortho Device: Knee Immobilizer;Crutches   Haskell Flirt 07/02/2012, 1:44 AM

## 2012-07-02 NOTE — ED Provider Notes (Signed)
History     CSN: [id number]  Arrival date & time 07/01/12  2053   First MD Initiated Contact with Patient 07/01/12 2258      Chief Complaint  Patient presents with  . Fall    (Consider location/radiation/quality/duration/timing/severity/associated sxs/prior treatment) HPI 32-year-old female presents to emergency room complaining of left knee pain and left abdominal pain after fall.  She reports that she was carrying her baby when she hit her child's infant bouncy seat with her left foot.  Patient hit her left abdomen on something and then landed on her left knee.  She, reports she's been able to walk, but has been very painful.  No prior injury to her left knee.  She has had no nausea or vomiting.  Pain is in her left pannus, superficial, does not go deep.  Past Medical History  Diagnosis Date  . Hypertension   . Herpes   . Pregnancy induced hypertension   . Abnormal Pap smear   . Depression     post-partum depression  . Bipolar 1 disorder   . Heartburn in pregnancy   . GERD (gastroesophageal reflux disease)     h/o GERD- wt loss alleviated it  . Infection     urinary tract infection  . Chronic kidney disease     kidney stone at age 73  . Ovarian cyst     age 49  . Cancer 2005    LEEP-cervical cancer    Past Surgical History  Procedure Laterality Date  . Leep    . Cesarean section      X 1, 2nd baby  . Cesarean section with bilateral tubal ligation Bilateral 04/09/2012    Procedure: REPEAT CESAREAN SECTION ;  Surgeon: Lesly Dukes, MD;  Location: [facility];  Service: Obstetrics;  Laterality: Bilateral;  Repeat    Family History  Problem Relation Age of Onset  . Diabetes Mother   . Hypertension Mother   . Mental illness Mother   . Diabetes Father   . Hypertension Father   . Asthma Sister   . Diabetes Sister   . Diabetes Maternal Grandmother     History  Substance Use Topics  . Smoking status: Former Smoker -- 0.50 packs/day    Types: Cigarettes    Quit  date: 03/22/2012  . Smokeless tobacco: Never Used  . Alcohol Use: No    OB History   Grav Para Term Preterm Abortions TAB SAB Ect Mult Living   3 3 3  0 0 0 0 0 0 3      Review of Systems  See History of Present Illness; otherwise all other systems are reviewed and negative  Allergies  Chocolate; Latex; and Morphine and related  Home Medications   Current Outpatient Rx  Name  Route  Sig  Dispense  Refill  . norethindrone (MICRONOR,CAMILA,ERRIN) 0.35 MG tablet   Oral   Take 1 tablet by mouth daily.         . naproxen (NAPROSYN) 500 MG tablet   Oral   Take 1 tablet (500 mg total) by mouth 2 (two) times daily.   30 tablet   0     BP 133/85  Pulse 67  Temp(Src) 98.1 F (36.7 C) (Oral)  Resp 19  SpO2 99%  LMP 06/17/2012  Breastfeeding? Unknown  Physical Exam  Nursing note and vitals reviewed. Constitutional: She is oriented to person, place, and time. She appears well-developed and well-nourished.  HENT:  Head: Normocephalic and atraumatic.  Nose: Nose  normal.  Mouth/Throat: Oropharynx is clear and moist.  Eyes: Conjunctivae and EOM are normal. Pupils are equal, round, and reactive to light.  Neck: Normal range of motion. Neck supple. No JVD present. No tracheal deviation present. No thyromegaly present.  Cardiovascular: Normal rate, regular rhythm, normal heart sounds and intact distal pulses.  Exam reveals no gallop and no friction rub.   No murmur heard. Pulmonary/Chest: Effort normal and breath sounds normal. No stridor. No respiratory distress. She has no wheezes. She has no rales. She exhibits no tenderness.  Abdominal: Soft. Bowel sounds are normal. She exhibits no distension and no mass. There is tenderness (mild tenderness to left pannus only.  She has small wound of bruising to left lower abdomen/pannus). There is no rebound and no guarding.  Musculoskeletal: She exhibits tenderness (tenderness to palpation over left patella with abrasion to inferior  patellar pole.  No crepitus, no edema.). She exhibits no edema.  Decreased range of motion of left knee, secondary to pain  Lymphadenopathy:    She has no cervical adenopathy.  Neurological: She is alert and oriented to person, place, and time. No cranial nerve deficit. She exhibits normal muscle tone. Coordination normal.  Skin: Skin is warm and dry. No rash noted. No erythema. No pallor.  Psychiatric: She has a normal mood and affect. Her behavior is normal. Judgment and thought content normal.    ED Course  Procedures (including critical care time)  Labs Reviewed - No data to display Dg Knee Complete 4 Views Left  07/01/2012   *RADIOLOGY REPORT*  Clinical Data: Fall with left knee injury and pain.  LEFT KNEE - COMPLETE 4+ VIEW  Comparison: None.  Findings: No evidence of fracture, dislocation or joint effusion. No bony lesions or significant degenerative disease identified.  IMPRESSION: Normal left knee.   Original Report Authenticated By: Irish Lack, M.D.     1. Knee injury, left, initial encounter   2. Fall at home, initial encounter       MDM  31 year old female status post fall.  X-rays are negative.  We'll place in knee immobilizer and crutches.  Patient instructed on importance of keeping the knee.  Flexible to prevent DVT and stiffening of the joint.  Patient is breast-feeding.  We'll place on Naprosyn.  Patient with soft abdomen.  Suspect superficial bruising only.  Do not feel patient needs further imaging of her abdomen.  At this time.  She was given strict precautions for return.        Olivia Mackie, MD 07/02/12 [phone]

## 2012-07-12 ENCOUNTER — Emergency Department (HOSPITAL_COMMUNITY)
Admission: EM | Admit: 2012-07-12 | Discharge: 2012-07-12 | Disposition: A | Discharge status: Home / Self Care | Payer: Medicaid Other | Attending: Emergency Medicine | Admitting: Emergency Medicine

## 2012-07-12 ENCOUNTER — Encounter (HOSPITAL_COMMUNITY): Payer: Self-pay | Admitting: Emergency Medicine

## 2012-07-12 DIAGNOSIS — R109 Unspecified abdominal pain: Secondary | ICD-10-CM | POA: Insufficient documentation

## 2012-07-12 DIAGNOSIS — Z87891 Personal history of nicotine dependence: Secondary | ICD-10-CM | POA: Insufficient documentation

## 2012-07-12 DIAGNOSIS — Z8742 Personal history of other diseases of the female genital tract: Secondary | ICD-10-CM | POA: Insufficient documentation

## 2012-07-12 DIAGNOSIS — Z8541 Personal history of malignant neoplasm of cervix uteri: Secondary | ICD-10-CM | POA: Insufficient documentation

## 2012-07-12 DIAGNOSIS — I10 Essential (primary) hypertension: Secondary | ICD-10-CM | POA: Insufficient documentation

## 2012-07-12 DIAGNOSIS — Z3202 Encounter for pregnancy test, result negative: Secondary | ICD-10-CM | POA: Insufficient documentation

## 2012-07-12 DIAGNOSIS — R112 Nausea with vomiting, unspecified: Secondary | ICD-10-CM

## 2012-07-12 DIAGNOSIS — Z8719 Personal history of other diseases of the digestive system: Secondary | ICD-10-CM | POA: Insufficient documentation

## 2012-07-12 DIAGNOSIS — N189 Chronic kidney disease, unspecified: Secondary | ICD-10-CM | POA: Insufficient documentation

## 2012-07-12 DIAGNOSIS — R197 Diarrhea, unspecified: Secondary | ICD-10-CM | POA: Insufficient documentation

## 2012-07-12 DIAGNOSIS — Z8659 Personal history of other mental and behavioral disorders: Secondary | ICD-10-CM | POA: Insufficient documentation

## 2012-07-12 DIAGNOSIS — Z8619 Personal history of other infectious and parasitic diseases: Secondary | ICD-10-CM | POA: Insufficient documentation

## 2012-07-12 DIAGNOSIS — N390 Urinary tract infection, site not specified: Secondary | ICD-10-CM

## 2012-07-12 LAB — URINALYSIS, ROUTINE W REFLEX MICROSCOPIC
Bilirubin Urine: NEGATIVE
Glucose, UA: NEGATIVE mg/dL
Hgb urine dipstick: NEGATIVE
Ketones, ur: NEGATIVE mg/dL
Nitrite: NEGATIVE
Protein, ur: NEGATIVE mg/dL
Specific Gravity, Urine: 1.018 (ref 1.005–1.030)
Urobilinogen, UA: 0.2 mg/dL (ref 0.0–1.0)
pH: 6 (ref 5.0–8.0)

## 2012-07-12 LAB — COMPREHENSIVE METABOLIC PANEL
ALT: 16 U/L (ref 0–35)
AST: 18 U/L (ref 0–37)
Albumin: 3.9 g/dL (ref 3.5–5.2)
Alkaline Phosphatase: 60 U/L (ref 39–117)
BUN: 8 mg/dL (ref 6–23)
CO2: 25 mEq/L (ref 19–32)
Calcium: 9.2 mg/dL (ref 8.4–10.5)
Chloride: 106 mEq/L (ref 96–112)
Creatinine, Ser: 0.57 mg/dL (ref 0.50–1.10)
GFR calc Af Amer: >90 mL/min (ref >90)
GFR calc non Af Amer: >90 mL/min (ref >90)
Glucose, Bld: 77 mg/dL (ref 70–99)
Potassium: 4.1 mEq/L (ref 3.5–5.1)
Sodium: 140 mEq/L (ref 135–145)
Total Bilirubin: 1.4 mg/dL — ABNORMAL HIGH (ref 0.3–1.2)
Total Protein: 7 g/dL (ref 6.0–8.3)

## 2012-07-12 LAB — CBC WITH DIFFERENTIAL/PLATELET
Basophils Absolute: 0 10*3/uL (ref 0.0–0.1)
Basophils Relative: 0 % (ref 0–1)
Eosinophils Absolute: 0.2 10*3/uL (ref 0.0–0.7)
Eosinophils Relative: 2 % (ref 0–5)
HCT: 41.6 % (ref 36.0–46.0)
Hemoglobin: 14.8 g/dL (ref 12.0–15.0)
Lymphocytes Relative: 21 % (ref 12–46)
Lymphs Abs: 2.1 10*3/uL (ref 0.7–4.0)
MCH: 31 pg (ref 26.0–34.0)
MCHC: 35.6 g/dL (ref 30.0–36.0)
MCV: 87.2 fL (ref 78.0–100.0)
Monocytes Absolute: 0.6 10*3/uL (ref 0.1–1.0)
Monocytes Relative: 6 % (ref 3–12)
Neutro Abs: 7.1 10*3/uL (ref 1.7–7.7)
Neutrophils Relative %: 70 % (ref 43–77)
Platelets: 197 10*3/uL (ref 150–400)
RBC: 4.77 MIL/uL (ref 3.87–5.11)
RDW: 12.1 % (ref 11.5–15.5)
WBC: 10.1 10*3/uL (ref 4.0–10.5)

## 2012-07-12 LAB — URINE MICROSCOPIC-ADD ON

## 2012-07-12 LAB — LIPASE, BLOOD: Lipase: 14 U/L (ref 11–59)

## 2012-07-12 LAB — POCT PREGNANCY, URINE: Preg Test, Ur: NEGATIVE

## 2012-07-12 MED ORDER — ONDANSETRON HCL 4 MG PO TABS: 4.0000 mg | ORAL_TABLET | Freq: Four times a day (QID) | ORAL | Status: DC

## 2012-07-12 MED ORDER — DEXTROSE 5 % IV SOLN
1.0000 g | Freq: Once | INTRAVENOUS | Status: AC
  Administered 2012-07-12: 1 g via INTRAVENOUS
  Filled 2012-07-12: qty 10

## 2012-07-12 MED ORDER — SODIUM CHLORIDE 0.9 % IV BOLUS (SEPSIS)
1000.0000 mL | Freq: Once | INTRAVENOUS | Status: AC
  Administered 2012-07-12: 1000 mL via INTRAVENOUS

## 2012-07-12 MED ORDER — ONDANSETRON HCL 4 MG/2ML IJ SOLN
4.0000 mg | Freq: Once | INTRAMUSCULAR | Status: AC
  Administered 2012-07-12: 4 mg via INTRAVENOUS
  Filled 2012-07-12: qty 2

## 2012-07-12 MED ORDER — CEPHALEXIN 500 MG PO CAPS: 500.0000 mg | ORAL_CAPSULE | Freq: Four times a day (QID) | ORAL | Status: DC

## 2012-07-12 NOTE — ED Notes (Signed)
Pt c/o N/V/D x 3 days with body aches

## 2012-07-12 NOTE — ED Provider Notes (Signed)
  Medical screening examination/treatment/procedure(s) were performed by non-physician practitioner and as supervising physician I was immediately available for consultation/collaboration.    Gerhard Munch, MD 07/12/12 1930

## 2012-07-12 NOTE — ED Provider Notes (Signed)
History     CSN: [id number]  Arrival date & time 07/12/12  1234   First MD Initiated Contact with Patient 07/12/12 1242      Chief Complaint  Patient presents with  . Vomiting  . Diarrhea    (Consider location/radiation/quality/duration/timing/severity/associated sxs/prior treatment) HPI  32 year-old female with history of GERD, presents complaining of nausea vomiting and diarrhea. Patient reports for the past 3 days she has had persistence diarrhea. Describe 4-5 bouts of nonbloody, non-mucous diarrhea and has started to have nausea and vomiting since this morning. Has had 3-4 bouts of nonbloody, nonbilious vomits. Patient felt nausea and, endorse generalized body aches, having runny nose, and headache. Complaining of some mild burning on urination. Complain of mild low abdominal pain when having bowel movement and pain improves after bowel movement. Denies fever, chills, chest pain, shortness of breath, or rash. Denies any lightheadedness, dizziness, numbness or weakness. No recent sick contact. No recent antibiotic use. No specific treatment tried.  Past Medical History  Diagnosis Date  . Hypertension   . Herpes   . Pregnancy induced hypertension   . Abnormal Pap smear   . Depression     post-partum depression  . Bipolar 1 disorder   . Heartburn in pregnancy   . GERD (gastroesophageal reflux disease)     h/o GERD- wt loss alleviated it  . Infection     urinary tract infection  . Chronic kidney disease     kidney stone at age 85  . Ovarian cyst     age 61  . Cancer 2005    LEEP-cervical cancer    Past Surgical History  Procedure Laterality Date  . Leep    . Cesarean section      X 1, 2nd baby  . Cesarean section with bilateral tubal ligation Bilateral 04/09/2012    Procedure: REPEAT CESAREAN SECTION ;  Surgeon: Lesly Dukes, MD;  Location: [facility];  Service: Obstetrics;  Laterality: Bilateral;  Repeat    Family History  Problem Relation Age of Onset  . Diabetes  Mother   . Hypertension Mother   . Mental illness Mother   . Diabetes Father   . Hypertension Father   . Asthma Sister   . Diabetes Sister   . Diabetes Maternal Grandmother     History  Substance Use Topics  . Smoking status: Former Smoker -- 0.50 packs/day    Types: Cigarettes    Quit date: 03/22/2012  . Smokeless tobacco: Never Used  . Alcohol Use: No    OB History   Grav Para Term Preterm Abortions TAB SAB Ect Mult Living   3 3 3  0 0 0 0 0 0 3      Review of Systems  All other systems reviewed and are negative.    Allergies  Chocolate; Latex; and Morphine and related  Home Medications   Current Outpatient Rx  Name  Route  Sig  Dispense  Refill  . naproxen (NAPROSYN) 500 MG tablet   Oral   Take 1 tablet (500 mg total) by mouth 2 (two) times daily.   30 tablet   0   . norethindrone (MICRONOR,CAMILA,ERRIN) 0.35 MG tablet   Oral   Take 1 tablet by mouth daily.           BP 160/91  Pulse 67  Temp(Src) 98.3 F (36.8 C) (Oral)  Resp 18  SpO2 100%  LMP 06/17/2012  Physical Exam  Nursing note and vitals reviewed. Constitutional: She is oriented  to person, place, and time. She appears well-developed and well-nourished. No distress.  Awake, alert, nontoxic appearance  HENT:  Head: Atraumatic.  Right Ear: External ear normal.  Left Ear: External ear normal.  Mouth/Throat: Oropharynx is clear and moist. No oropharyngeal exudate.  Eyes: Conjunctivae are normal. Right eye exhibits no discharge. Left eye exhibits no discharge.  Neck: Neck supple.  Cardiovascular: Normal rate and regular rhythm.   Pulmonary/Chest: Effort normal. No respiratory distress. She exhibits no tenderness.  Abdominal: Soft. There is tenderness (Mild suprapubic tenderness without guarding or rebound tenderness. Negative Murphy sign, no McBurney's point. No hernia noted.). There is no rebound.  Genitourinary:  No CVA tenderness  Musculoskeletal: She exhibits no tenderness.  ROM  appears intact, no obvious focal weakness  Neurological: She is alert and oriented to person, place, and time.  Mental status and motor strength appears intact  Skin: No rash noted.  Psychiatric: She has a normal mood and affect.    ED Course  Procedures (including critical care time)  12:54 PM Patient presents with symptoms suggestive of viral gastroenteritis. She has a nonsurgical abdomen. She is afebrile with stable normal vital sign. Workup initiated.  2:17 PM Urine shows evidence of urinary tract infection. Since patient has nausea vomiting diarrhea we'll give Rocephin IV here. Patient will be stable for discharge once she is able to tolerates food by mouth. Patient agrees with plan.  2:58 PM Pt felt better, tolerates PO, stable for discharge.  Care instruction and return precaution given.  Labs Reviewed  URINALYSIS, ROUTINE W REFLEX MICROSCOPIC - Abnormal; Notable for the following:    Leukocytes, UA SMALL (*)    All other components within normal limits  COMPREHENSIVE METABOLIC PANEL - Abnormal; Notable for the following:    Total Bilirubin 1.4 (*)    All other components within normal limits  URINE MICROSCOPIC-ADD ON - Abnormal; Notable for the following:    Bacteria, UA FEW (*)    All other components within normal limits  URINE CULTURE  CBC WITH DIFFERENTIAL  LIPASE, BLOOD  POCT PREGNANCY, URINE   No results found.   1. UTI (lower urinary tract infection)   2. Nausea vomiting and diarrhea       MDM  BP 121/77  Pulse 57  Temp(Src) 98.3 F (36.8 C) (Oral)  Resp 17  SpO2 99%  LMP 06/17/2012   I have reviewed nursing notes and vital signs.  I reviewed available ER/hospitalization records thought the EMR       Fayrene Helper, [place] 07/12/12 1459

## 2012-07-14 LAB — URINE CULTURE: Colony Count: 15000

## 2012-07-15 ENCOUNTER — Telehealth (HOSPITAL_COMMUNITY): Payer: Self-pay | Admitting: Emergency Medicine

## 2012-07-15 NOTE — ED Notes (Signed)
Post ED Visit - Positive Culture Follow-up  Culture report reviewed by antimicrobial stewardship pharmacist: []  Wes Dulaney, Pharm.D., BCPS []  Celedonio Miyamoto, Pharm.D., BCPS []  Georgina Pillion, Pharm.D., BCPS []  [place], [address].D., BCPS, AAHIVP []  Estella Husk, Pharm.D., BCPS, AAHIVP [x]  Laurence Slate, [address].D., BCPS  Positive urine culture Treated with Keflex, organism sensitive to the same and no further patient follow-up is required at this time.  Kylie A Holland 07/15/2012, 11:20 AM

## 2012-08-15 ENCOUNTER — Other Ambulatory Visit: Payer: Self-pay | Admitting: Nurse Practitioner

## 2012-08-15 DIAGNOSIS — N938 Other specified abnormal uterine and vaginal bleeding: Secondary | ICD-10-CM

## 2012-08-15 DIAGNOSIS — N925 Other specified irregular menstruation: Secondary | ICD-10-CM

## 2012-08-15 DIAGNOSIS — N949 Unspecified condition associated with female genital organs and menstrual cycle: Secondary | ICD-10-CM

## 2012-08-17 ENCOUNTER — Other Ambulatory Visit: Payer: Self-pay | Admitting: Obstetrics & Gynecology

## 2012-08-17 DIAGNOSIS — N925 Other specified irregular menstruation: Secondary | ICD-10-CM

## 2012-08-17 DIAGNOSIS — R102 Pelvic and perineal pain: Secondary | ICD-10-CM

## 2012-08-17 DIAGNOSIS — N949 Unspecified condition associated with female genital organs and menstrual cycle: Secondary | ICD-10-CM

## 2012-08-17 DIAGNOSIS — N938 Other specified abnormal uterine and vaginal bleeding: Secondary | ICD-10-CM

## 2012-08-20 ENCOUNTER — Telehealth: Payer: Self-pay | Admitting: *Deleted

## 2012-08-20 ENCOUNTER — Ambulatory Visit (HOSPITAL_COMMUNITY)
Admission: RE | Admit: 2012-08-20 | Discharge: 2012-08-20 | Disposition: A | Discharge status: Home / Self Care | Payer: Medicaid Other | Source: Ambulatory Visit | Attending: Nurse Practitioner | Admitting: Nurse Practitioner

## 2012-08-20 DIAGNOSIS — N938 Other specified abnormal uterine and vaginal bleeding: Secondary | ICD-10-CM | POA: Insufficient documentation

## 2012-08-20 DIAGNOSIS — R1031 Right lower quadrant pain: Secondary | ICD-10-CM | POA: Insufficient documentation

## 2012-08-20 DIAGNOSIS — IMO0002 Reserved for concepts with insufficient information to code with codable children: Secondary | ICD-10-CM | POA: Insufficient documentation

## 2012-08-20 DIAGNOSIS — N946 Dysmenorrhea, unspecified: Secondary | ICD-10-CM | POA: Insufficient documentation

## 2012-08-20 DIAGNOSIS — N925 Other specified irregular menstruation: Secondary | ICD-10-CM | POA: Insufficient documentation

## 2012-08-20 DIAGNOSIS — N949 Unspecified condition associated with female genital organs and menstrual cycle: Secondary | ICD-10-CM | POA: Insufficient documentation

## 2012-08-20 NOTE — Telephone Encounter (Signed)
Radiology called regarding an order for an ultrasound for this patient authorized by Dr. Macon Large.  Upon further search, this patient is not a patient of our [place] office and looks to have been seen in the ED.

## 2012-08-31 ENCOUNTER — Inpatient Hospital Stay (HOSPITAL_COMMUNITY)
Admission: AD | Admit: 2012-08-31 | Discharge: 2012-08-31 | Disposition: A | Discharge status: Home / Self Care | Payer: Medicaid Other | Source: Ambulatory Visit | Attending: Family Medicine | Admitting: Family Medicine

## 2012-08-31 ENCOUNTER — Encounter (HOSPITAL_COMMUNITY): Payer: Self-pay

## 2012-08-31 DIAGNOSIS — S3141XA Laceration without foreign body of vagina and vulva, initial encounter: Secondary | ICD-10-CM

## 2012-08-31 DIAGNOSIS — N949 Unspecified condition associated with female genital organs and menstrual cycle: Secondary | ICD-10-CM

## 2012-08-31 DIAGNOSIS — X58XXXA Exposure to other specified factors, initial encounter: Secondary | ICD-10-CM | POA: Insufficient documentation

## 2012-08-31 DIAGNOSIS — B009 Herpesviral infection, unspecified: Secondary | ICD-10-CM | POA: Insufficient documentation

## 2012-08-31 DIAGNOSIS — S3140XA Unspecified open wound of vagina and vulva, initial encounter: Secondary | ICD-10-CM

## 2012-08-31 DIAGNOSIS — I10 Essential (primary) hypertension: Secondary | ICD-10-CM | POA: Insufficient documentation

## 2012-08-31 LAB — URINALYSIS, ROUTINE W REFLEX MICROSCOPIC
Bilirubin Urine: NEGATIVE
Glucose, UA: NEGATIVE mg/dL
Ketones, ur: NEGATIVE mg/dL
Nitrite: NEGATIVE
Protein, ur: NEGATIVE mg/dL
Specific Gravity, Urine: 1.01 (ref 1.005–1.030)
Urobilinogen, UA: 0.2 mg/dL (ref 0.0–1.0)
pH: 6 (ref 5.0–8.0)

## 2012-08-31 LAB — URINE MICROSCOPIC-ADD ON

## 2012-08-31 LAB — GC/CHLAMYDIA PROBE AMP
CT Probe RNA: NEGATIVE
GC Probe RNA: NEGATIVE

## 2012-08-31 LAB — WET PREP, GENITAL
Clue Cells Wet Prep HPF POC: NONE SEEN
Trich, Wet Prep: NONE SEEN
Yeast Wet Prep HPF POC: NONE SEEN

## 2012-08-31 LAB — POCT PREGNANCY, URINE: Preg Test, Ur: NEGATIVE

## 2012-08-31 MED ORDER — LIDOCAINE HCL 2 % EX GEL
Freq: Once | CUTANEOUS | Status: AC
  Administered 2012-08-31: 5 via TOPICAL
  Filled 2012-08-31: qty 20

## 2012-08-31 MED ORDER — ASTROGLIDE EX GEL: 1.0000 [drp] | CUTANEOUS | Status: DC | PRN

## 2012-08-31 NOTE — MAU Provider Note (Signed)
Chart reviewed and agree with management and plan.  

## 2012-08-31 NOTE — MAU Provider Note (Signed)
Chief Complaint: Vaginal Itching  First Provider Initiated Contact with Patient 08/31/12 [phone]     SUBJECTIVE HPI: Robin Martin is a 32 y.o. [id number] female who presents with vaginal burning and scant spotting x 2 days that started soon after intercourse. Per patient's husband it looks as if the patient has cuts on her vulva. Patient states she and her husband had normal, consensual vaginal/penile intercourse. Has HSV, but not in this location and states that this does not feel like HSV outbreak.   Past Medical History  Diagnosis Date  . Hypertension   . Herpes   . Pregnancy induced hypertension   . Abnormal Pap smear   . Depression     post-partum depression  . Bipolar 1 disorder   . Heartburn in pregnancy   . GERD (gastroesophageal reflux disease)     h/o GERD- wt loss alleviated it  . Infection     urinary tract infection  . Chronic kidney disease     kidney stone at age 51  . Ovarian cyst     age 52  . Cancer 2005    LEEP-cervical cancer   OB History   Grav Para Term Preterm Abortions TAB SAB Ect Mult Living   3 3 3  0 0 0 0 0 0 3     # Outc Date GA Lbr Len/2nd Wgt Sex Del Anes PTL Lv   1 TRM 2002 [redacted]w[redacted]d  3.629kg(8lb) M    Yes   2 TRM 2005 [redacted]w[redacted]d   M CS   Yes   3 TRM 3/14 [redacted]w[redacted]d 00:00 4.51kg(9lb15.1oz) M LTCS Spinal  Yes     Past Surgical History  Procedure Laterality Date  . Leep    . Cesarean section with bilateral tubal ligation Bilateral 04/09/2012    BTL not done  . Cesarean section      x2   History   Social History  . Marital Status: Divorced    Spouse Name: N/A    Number of Children: N/A  . Years of Education: N/A   Occupational History  . Not on file.   Social History Main Topics  . Smoking status: Former Smoker -- 0.50 packs/day    Types: Cigarettes    Quit date: 03/22/2012  . Smokeless tobacco: Never Used  . Alcohol Use: No  . Drug Use: No  . Sexually Active: Yes    Birth Control/ Protection: Pill   Other Topics Concern  . Not on file    Social History Narrative  . No narrative on file   No current facility-administered medications on file prior to encounter.   Current Outpatient Prescriptions on File Prior to Encounter  Medication Sig Dispense Refill  . norethindrone (MICRONOR,CAMILA,ERRIN) 0.35 MG tablet Take 1 tablet by mouth daily.       Allergies  Allergen Reactions  . Chocolate Other (See Comments)    Thins blood  . Latex Rash  . Morphine And Related Itching, Nausea And Vomiting and Rash    ROS: Pertinent positive items in HPI. Negative for fever, chills, vaginal discharge, urinary complaints (except for external burning with urination), GI complaints.  OBJECTIVE Blood pressure 149/86, pulse 89, temperature 98 F (36.7 C), temperature source Oral, resp. rate 18, last menstrual period 08/11/2012. GENERAL: Well-developed, well-nourished female in no acute distress.  HEENT: Normocephalic HEART: normal rate RESP: normal effort ABDOMEN: Soft, non-tender EXTREMITIES: Nontender, no edema NEURO: Alert and oriented SPECULUM EXAM: NEFG except for 2 mm lesions on right labia minora and irreg, linear skin  tear on left labia minora. No active bleeding.  Physiologic discharge, no blood noted, cervix clean. BIMANUAL: cervix closed; uterus normal size, no adnexal tenderness or masses.  LAB RESULTS Results for orders placed during the hospital encounter of 08/31/12 (from the past 24 hour(s))  URINALYSIS, ROUTINE W REFLEX MICROSCOPIC     Status: Abnormal   Collection Time    08/31/12  5:57 AM      Result Value Range   Color, Urine YELLOW  YELLOW   APPearance CLEAR  CLEAR   Specific Gravity, Urine 1.010  1.005 - 1.030   pH 6.0  5.0 - 8.0   Glucose, UA NEGATIVE  NEGATIVE mg/dL   Hgb urine dipstick SMALL (*) NEGATIVE   Bilirubin Urine NEGATIVE  NEGATIVE   Ketones, ur NEGATIVE  NEGATIVE mg/dL   Protein, ur NEGATIVE  NEGATIVE mg/dL   Urobilinogen, UA 0.2  0.0 - 1.0 mg/dL   Nitrite NEGATIVE  NEGATIVE   Leukocytes,  UA SMALL (*) NEGATIVE  URINE MICROSCOPIC-ADD ON     Status: Abnormal   Collection Time    08/31/12  5:57 AM      Result Value Range   Squamous Epithelial / LPF RARE  RARE   WBC, UA 3-6  <3 WBC/hpf   Bacteria, UA FEW (*) RARE  POCT PREGNANCY, URINE     Status: None   Collection Time    08/31/12  6:11 AM      Result Value Range   Preg Test, Ur NEGATIVE  NEGATIVE  WET PREP, GENITAL     Status: Abnormal   Collection Time    08/31/12  6:50 AM      Result Value Range   Yeast Wet Prep HPF POC NONE SEEN  NONE SEEN   Trich, Wet Prep NONE SEEN  NONE SEEN   Clue Cells Wet Prep HPF POC NONE SEEN  NONE SEEN   WBC, Wet Prep HPF POC MANY (*) NONE SEEN    IMAGING NA  MAU COURSE Pain relived w/ applied Lidocaine gel.   ASSESSMENT 1. Traumatic vaginal laceration, initial encounter    PLAN Discharge home in stable condition.  No sex x 1 week.  Discussed ways to avoid vaginal trauma.  Lidocaine gel PRN.  GC/CT pending.   Medication List         Astroglide Gel  Apply 1 drop topically as needed.     norethindrone 0.35 MG tablet  Commonly known as:  MICRONOR,CAMILA,ERRIN  Take 1 tablet by mouth daily.       [place], [place] 08/31/2012  8:33 AM

## 2012-08-31 NOTE — MAU Note (Signed)
Vaginal pain & itching since Wednesday. Per patient's spouse, it looks "cut open down there". Some spotting when wipes. Vaginal burning when urinates.

## 2012-09-01 LAB — URINE CULTURE
Colony Count: NO GROWTH
Culture: NO GROWTH

## 2013-02-09 ENCOUNTER — Encounter (HOSPITAL_COMMUNITY): Payer: Self-pay | Admitting: Emergency Medicine

## 2013-02-09 ENCOUNTER — Emergency Department (HOSPITAL_COMMUNITY)
Admission: EM | Admit: 2013-02-09 | Discharge: 2013-02-09 | Disposition: A | Discharge status: Home / Self Care | Payer: Medicaid Other | Attending: Emergency Medicine | Admitting: Emergency Medicine

## 2013-02-09 ENCOUNTER — Emergency Department (HOSPITAL_COMMUNITY): Payer: Medicaid Other

## 2013-02-09 DIAGNOSIS — F329 Major depressive disorder, single episode, unspecified: Secondary | ICD-10-CM | POA: Insufficient documentation

## 2013-02-09 DIAGNOSIS — Z8619 Personal history of other infectious and parasitic diseases: Secondary | ICD-10-CM | POA: Insufficient documentation

## 2013-02-09 DIAGNOSIS — Z8541 Personal history of malignant neoplasm of cervix uteri: Secondary | ICD-10-CM | POA: Insufficient documentation

## 2013-02-09 DIAGNOSIS — Z3202 Encounter for pregnancy test, result negative: Secondary | ICD-10-CM | POA: Insufficient documentation

## 2013-02-09 DIAGNOSIS — X500XXA Overexertion from strenuous movement or load, initial encounter: Secondary | ICD-10-CM | POA: Insufficient documentation

## 2013-02-09 DIAGNOSIS — Z8719 Personal history of other diseases of the digestive system: Secondary | ICD-10-CM | POA: Insufficient documentation

## 2013-02-09 DIAGNOSIS — F3289 Other specified depressive episodes: Secondary | ICD-10-CM | POA: Insufficient documentation

## 2013-02-09 DIAGNOSIS — M47816 Spondylosis without myelopathy or radiculopathy, lumbar region: Secondary | ICD-10-CM

## 2013-02-09 DIAGNOSIS — Y9389 Activity, other specified: Secondary | ICD-10-CM | POA: Insufficient documentation

## 2013-02-09 DIAGNOSIS — Y929 Unspecified place or not applicable: Secondary | ICD-10-CM | POA: Insufficient documentation

## 2013-02-09 DIAGNOSIS — Z87891 Personal history of nicotine dependence: Secondary | ICD-10-CM | POA: Insufficient documentation

## 2013-02-09 DIAGNOSIS — Z87442 Personal history of urinary calculi: Secondary | ICD-10-CM | POA: Insufficient documentation

## 2013-02-09 DIAGNOSIS — N189 Chronic kidney disease, unspecified: Secondary | ICD-10-CM | POA: Insufficient documentation

## 2013-02-09 DIAGNOSIS — Z79899 Other long term (current) drug therapy: Secondary | ICD-10-CM | POA: Insufficient documentation

## 2013-02-09 DIAGNOSIS — M47817 Spondylosis without myelopathy or radiculopathy, lumbosacral region: Secondary | ICD-10-CM | POA: Insufficient documentation

## 2013-02-09 DIAGNOSIS — S39012A Strain of muscle, fascia and tendon of lower back, initial encounter: Secondary | ICD-10-CM

## 2013-02-09 DIAGNOSIS — I129 Hypertensive chronic kidney disease with stage 1 through stage 4 chronic kidney disease, or unspecified chronic kidney disease: Secondary | ICD-10-CM | POA: Insufficient documentation

## 2013-02-09 DIAGNOSIS — Z8742 Personal history of other diseases of the female genital tract: Secondary | ICD-10-CM | POA: Insufficient documentation

## 2013-02-09 DIAGNOSIS — S335XXA Sprain of ligaments of lumbar spine, initial encounter: Secondary | ICD-10-CM | POA: Insufficient documentation

## 2013-02-09 DIAGNOSIS — Z91040 Latex allergy status: Secondary | ICD-10-CM | POA: Insufficient documentation

## 2013-02-09 LAB — URINALYSIS, ROUTINE W REFLEX MICROSCOPIC
Bilirubin Urine: NEGATIVE
Glucose, UA: NEGATIVE mg/dL
Hgb urine dipstick: NEGATIVE
Ketones, ur: NEGATIVE mg/dL
Leukocytes, UA: NEGATIVE
Nitrite: NEGATIVE
Protein, ur: NEGATIVE mg/dL
Specific Gravity, Urine: 1.02 (ref 1.005–1.030)
Urobilinogen, UA: 0.2 mg/dL (ref 0.0–1.0)
pH: 6 (ref 5.0–8.0)

## 2013-02-09 LAB — POCT PREGNANCY, URINE: Preg Test, Ur: NEGATIVE

## 2013-02-09 MED ORDER — METHOCARBAMOL 500 MG PO TABS: 500.0000 mg | ORAL_TABLET | Freq: Three times a day (TID) | ORAL | Status: DC

## 2013-02-09 MED ORDER — HYDROCODONE-ACETAMINOPHEN 5-325 MG PO TABS
2.0000 | ORAL_TABLET | Freq: Once | ORAL | Status: AC
  Administered 2013-02-09: 2 via ORAL
  Filled 2013-02-09: qty 2

## 2013-02-09 MED ORDER — DIAZEPAM 5 MG PO TABS
5.0000 mg | ORAL_TABLET | Freq: Once | ORAL | Status: AC
  Administered 2013-02-09: 5 mg via ORAL
  Filled 2013-02-09: qty 1

## 2013-02-09 MED ORDER — DEXAMETHASONE 6 MG PO TABS: ORAL_TABLET | ORAL | Status: DC

## 2013-02-09 MED ORDER — HYDROCODONE-ACETAMINOPHEN 5-325 MG PO TABS: 1.0000 | ORAL_TABLET | Freq: Four times a day (QID) | ORAL | Status: DC | PRN

## 2013-02-09 MED ORDER — ONDANSETRON HCL 4 MG PO TABS
4.0000 mg | ORAL_TABLET | Freq: Once | ORAL | Status: AC
  Administered 2013-02-09: 4 mg via ORAL
  Filled 2013-02-09: qty 1

## 2013-02-09 MED ORDER — KETOROLAC TROMETHAMINE 10 MG PO TABS
10.0000 mg | ORAL_TABLET | Freq: Once | ORAL | Status: AC
  Administered 2013-02-09: 10 mg via ORAL
  Filled 2013-02-09: qty 1

## 2013-02-09 NOTE — ED Provider Notes (Signed)
Medical screening examination/treatment/procedure(s) were performed by non-physician practitioner and as supervising physician I was immediately available for consultation/collaboration.  EKG Interpretation   None        Virgel Manifold, MD 02/09/13 [phone]

## 2013-02-09 NOTE — ED Provider Notes (Signed)
CSN: [id number]     Arrival date & time 02/09/13  1518 History   First MD Initiated Contact with Patient 02/09/13 1608     Chief Complaint  Patient presents with  . Back Pain   (Consider location/radiation/quality/duration/timing/severity/associated sxs/prior Treatment) Patient is a 33 y.o. female presenting with back pain. The history is provided by the patient.  Back Pain Location:  Lumbar spine Quality:  Aching and cramping Radiates to:  L thigh and R thigh Pain severity:  Severe Pain is:  Same all the time Onset quality:  Sudden Duration:  4 days Timing:  Intermittent Progression:  Worsening Chronicity: acute on chronic. Context comment:  Pt has had some back pain since epidural 13 years ago. Four days ago she was bending oven to lift clothes and her back locked. She has had increasing pain since that time. Relieved by:  Nothing Worsened by:  Movement and standing Ineffective treatments:  None tried Associated symptoms: no abdominal pain, no bladder incontinence, no bowel incontinence, no chest pain, no dysuria and no numbness     Past Medical History  Diagnosis Date  . Hypertension   . Herpes   . Pregnancy induced hypertension   . Abnormal Pap smear   . Depression     post-partum depression  . Bipolar 1 disorder   . Heartburn in pregnancy   . GERD (gastroesophageal reflux disease)     h/o GERD- wt loss alleviated it  . Infection     urinary tract infection  . Chronic kidney disease     kidney stone at age 80  . Ovarian cyst     age 80  . Cancer 2005    LEEP-cervical cancer   Past Surgical History  Procedure Laterality Date  . Leep    . Cesarean section with bilateral tubal ligation Bilateral 04/09/2012    BTL not done  . Cesarean section      x2   Family History  Problem Relation Age of Onset  . Diabetes Mother   . Hypertension Mother   . Mental illness Mother   . Diabetes Father   . Hypertension Father   . Asthma Sister   . Diabetes Sister   .  Diabetes Maternal Grandmother    History  Substance Use Topics  . Smoking status: Former Smoker -- 0.50 packs/day    Types: Cigarettes    Quit date: 03/22/2012  . Smokeless tobacco: Never Used  . Alcohol Use: No   OB History   Grav Para Term Preterm Abortions TAB SAB Ect Mult Living   3 3 3  0 0 0 0 0 0 3     Review of Systems  Constitutional: Negative for activity change.       All ROS Neg except as noted in HPI  HENT: Negative for nosebleeds.   Eyes: Negative for photophobia and discharge.  Respiratory: Negative for cough, shortness of breath and wheezing.   Cardiovascular: Negative for chest pain and palpitations.  Gastrointestinal: Negative for abdominal pain, blood in stool and bowel incontinence.  Genitourinary: Negative for bladder incontinence, dysuria, frequency and hematuria.  Musculoskeletal: Positive for back pain. Negative for arthralgias and neck pain.  Skin: Negative.   Neurological: Negative for dizziness, seizures, speech difficulty and numbness.  Psychiatric/Behavioral: Negative for hallucinations and confusion.       Depression    Allergies  Chocolate; Latex; and Morphine and related  Home Medications   Current Outpatient Rx  Name  Route  Sig  Dispense  Refill  .  Lubricants (ASTROGLIDE) GEL   Apply externally   Apply 1 drop topically as needed.   1 Tube   0   . norethindrone (MICRONOR,CAMILA,ERRIN) 0.35 MG tablet   Oral   Take 1 tablet by mouth daily.          BP 134/77  Pulse 67  Temp(Src) 98.7 F (37.1 C) (Oral)  Resp 18  Ht 5' 7.5" (1.715 m)  Wt 250 lb (113.399 kg)  BMI 38.56 kg/m2  SpO2 98%  LMP 01/09/2013 Physical Exam  Nursing note and vitals reviewed. Constitutional: She is oriented to person, place, and time. She appears well-developed and well-nourished.  Non-toxic appearance.  HENT:  Head: Normocephalic.  Right Ear: Tympanic membrane and external ear normal.  Left Ear: Tympanic membrane and external ear normal.  Eyes: EOM  and lids are normal. Pupils are equal, round, and reactive to light.  Neck: Normal range of motion. Neck supple. Carotid bruit is not present.  Cardiovascular: Normal rate, regular rhythm, normal heart sounds, intact distal pulses and normal pulses.   Pulmonary/Chest: Breath sounds normal. No respiratory distress.  Abdominal: Soft. Bowel sounds are normal. There is no tenderness. There is no guarding.  Musculoskeletal:       Lumbar back: She exhibits decreased range of motion, tenderness and pain.  Lymphadenopathy:       Head (right side): No submandibular adenopathy present.       Head (left side): No submandibular adenopathy present.    She has no cervical adenopathy.  Neurological: She is alert and oriented to person, place, and time. She has normal strength. No cranial nerve deficit or sensory deficit. She exhibits normal muscle tone.  Difficulty walking due to pain. No foot drop noted.  Skin: Skin is warm and dry.  Psychiatric: She has a normal mood and affect. Her speech is normal.    ED Course  Procedures (including critical care time) Labs Review Labs Reviewed - No data to display Imaging Review No results found.  EKG Interpretation   None       MDM  No diagnosis found. *I have reviewed nursing notes, vital signs, and all appropriate lab and imaging results for this patient.**  Urine pregnancy is negative. Urinalysis reveals a cloudy yellow specimen with a specific gravity of 1.020, no acute changes appreciated on the urine analysis. No evidence of urinary tract infection. The CT scan of the lumbar spine reveals some mild disc bulging and anterior osteophytes at the L1-L2 area there is mild loss of disc height at the disc bulging and facet hypertrophy at the L3-L4 area there is mild triangulation of the thecal sac also present. There is mild disc bulging at the L4-5 area and the L5-S1 area. There is no disc herniation noted and no significant spinal stenosis or nerve root  encroachment noted on CT.  These findings have been discussed with the patient. Prescription for Norco, Robaxin, and Decadron given to the patient. Patient also fitted with crutches. Patient strongly advised to see her at [facility] access physician for possible MRI, and orthopedic or neurosurgery referral.  Lenox Ahr, PA-C 02/09/13 1732

## 2013-02-09 NOTE — ED Notes (Signed)
Pt seen and evaluated by EDPa for initial assessment. 

## 2013-02-09 NOTE — ED Notes (Signed)
Pt refused crutches. EDPa informed.

## 2013-02-09 NOTE — Discharge Instructions (Signed)
Your CT scan is negative for any fracture or dislocation. It does show some mild narrowing of your spine. There is no herniated disc on the CT noted. It is important that you see your [facility] access physician for MRI, and referral to the appropriate orthopedic surgeon or neurosurgery specialist depending on the results of the MRI. Please use the medications as suggested. The Robaxin and Norco may cause drowsiness, please use with caution. Please use crutches until you're able to safely walk without problem. Lumbosacral Strain Lumbosacral strain is a strain of any of the parts that make up your lumbosacral vertebrae. Your lumbosacral vertebrae are the bones that make up the lower third of your backbone. Your lumbosacral vertebrae are held together by muscles and tough, fibrous tissue (ligaments).  CAUSES  A sudden blow to your back can cause lumbosacral strain. Also, anything that causes an excessive stretch of the muscles in the low back can cause this strain. This is typically seen when people exert themselves strenuously, fall, lift heavy objects, bend, or crouch repeatedly. RISK FACTORS  Physically demanding work.  Participation in pushing or pulling sports or sports that require sudden twist of the back (tennis, golf, baseball).  Weight lifting.  Excessive lower back curvature.  Forward-tilted pelvis.  Weak back or abdominal muscles or both.  Tight hamstrings. SIGNS AND SYMPTOMS  Lumbosacral strain may cause pain in the area of your injury or pain that moves (radiates) down your leg.  DIAGNOSIS Your health care provider can often diagnose lumbosacral strain through a physical exam. In some cases, you may need tests such as X-ray exams.  TREATMENT  Treatment for your lower back injury depends on many factors that your clinician will have to evaluate. However, most treatment will include the use of anti-inflammatory medicines. HOME CARE INSTRUCTIONS   Avoid hard physical  activities (tennis, racquetball, waterskiing) if you are not in proper physical condition for it. This may aggravate or create problems.  If you have a back problem, avoid sports requiring sudden body movements. Swimming and walking are generally safer activities.  Maintain good posture.  Maintain a healthy weight.  For acute conditions, you may put ice on the injured area.  Put ice in a plastic bag.  Place a towel between your skin and the bag.  Leave the ice on for 20 minutes, 2 3 times a day.  When the low back starts healing, stretching and strengthening exercises may be recommended. SEEK MEDICAL CARE IF:  Your back pain is getting worse.  You experience severe back pain not relieved with medicines. SEEK IMMEDIATE MEDICAL CARE IF:   You have numbness, tingling, weakness, or problems with the use of your arms or legs.  There is a change in bowel or bladder control.  You have increasing pain in any area of the body, including your belly (abdomen).  You notice shortness of breath, dizziness, or feel faint.  You feel sick to your stomach (nauseous), are throwing up (vomiting), or become sweaty.  You notice discoloration of your toes or legs, or your feet get very cold. MAKE SURE YOU:   Understand these instructions.  Will watch your condition.  Will get help right away if you are not doing well or get worse. Document Released: 10/20/2004 Document Revised: 10/31/2012 Document Reviewed: 08/29/2012 [facility] Patient Information 2014 [place], [place].

## 2013-02-09 NOTE — ED Notes (Signed)
Back pain for the past 4 days, has a history of same

## 2013-02-18 ENCOUNTER — Emergency Department (HOSPITAL_COMMUNITY): Payer: Medicaid Other

## 2013-02-18 ENCOUNTER — Encounter (HOSPITAL_COMMUNITY): Payer: Self-pay | Admitting: Emergency Medicine

## 2013-02-18 ENCOUNTER — Emergency Department (HOSPITAL_COMMUNITY)
Admission: EM | Admit: 2013-02-18 | Discharge: 2013-02-18 | Disposition: A | Discharge status: Home / Self Care | Payer: Medicaid Other | Attending: Emergency Medicine | Admitting: Emergency Medicine

## 2013-02-18 DIAGNOSIS — I10 Essential (primary) hypertension: Secondary | ICD-10-CM | POA: Insufficient documentation

## 2013-02-18 DIAGNOSIS — K5289 Other specified noninfective gastroenteritis and colitis: Secondary | ICD-10-CM | POA: Insufficient documentation

## 2013-02-18 DIAGNOSIS — Z8619 Personal history of other infectious and parasitic diseases: Secondary | ICD-10-CM | POA: Insufficient documentation

## 2013-02-18 DIAGNOSIS — K529 Noninfective gastroenteritis and colitis, unspecified: Secondary | ICD-10-CM

## 2013-02-18 DIAGNOSIS — IMO0002 Reserved for concepts with insufficient information to code with codable children: Secondary | ICD-10-CM | POA: Insufficient documentation

## 2013-02-18 DIAGNOSIS — Z8659 Personal history of other mental and behavioral disorders: Secondary | ICD-10-CM | POA: Insufficient documentation

## 2013-02-18 DIAGNOSIS — Z87442 Personal history of urinary calculi: Secondary | ICD-10-CM | POA: Insufficient documentation

## 2013-02-18 DIAGNOSIS — Z87891 Personal history of nicotine dependence: Secondary | ICD-10-CM | POA: Insufficient documentation

## 2013-02-18 DIAGNOSIS — Z8541 Personal history of malignant neoplasm of cervix uteri: Secondary | ICD-10-CM | POA: Insufficient documentation

## 2013-02-18 DIAGNOSIS — Z79899 Other long term (current) drug therapy: Secondary | ICD-10-CM | POA: Insufficient documentation

## 2013-02-18 DIAGNOSIS — Z91040 Latex allergy status: Secondary | ICD-10-CM | POA: Insufficient documentation

## 2013-02-18 DIAGNOSIS — Z8742 Personal history of other diseases of the female genital tract: Secondary | ICD-10-CM | POA: Insufficient documentation

## 2013-02-18 DIAGNOSIS — Z87440 Personal history of urinary (tract) infections: Secondary | ICD-10-CM | POA: Insufficient documentation

## 2013-02-18 DIAGNOSIS — Z3202 Encounter for pregnancy test, result negative: Secondary | ICD-10-CM | POA: Insufficient documentation

## 2013-02-18 LAB — CBC WITH DIFFERENTIAL/PLATELET
Basophils Absolute: 0 10*3/uL (ref 0.0–0.1)
Basophils Relative: 0 % (ref 0–1)
Eosinophils Absolute: 0.3 10*3/uL (ref 0.0–0.7)
Eosinophils Relative: 2 % (ref 0–5)
HCT: 41.7 % (ref 36.0–46.0)
Hemoglobin: 14.2 g/dL (ref 12.0–15.0)
Lymphocytes Relative: 22 % (ref 12–46)
Lymphs Abs: 2.6 10*3/uL (ref 0.7–4.0)
MCH: 29.9 pg (ref 26.0–34.0)
MCHC: 34.1 g/dL (ref 30.0–36.0)
MCV: 87.8 fL (ref 78.0–100.0)
Monocytes Absolute: 0.7 10*3/uL (ref 0.1–1.0)
Monocytes Relative: 6 % (ref 3–12)
Neutro Abs: 7.9 10*3/uL — ABNORMAL HIGH (ref 1.7–7.7)
Neutrophils Relative %: 69 % (ref 43–77)
Platelets: 219 10*3/uL (ref 150–400)
RBC: 4.75 MIL/uL (ref 3.87–5.11)
RDW: 13 % (ref 11.5–15.5)
WBC: 11.5 10*3/uL — ABNORMAL HIGH (ref 4.0–10.5)

## 2013-02-18 LAB — URINALYSIS, ROUTINE W REFLEX MICROSCOPIC
Bilirubin Urine: NEGATIVE
Glucose, UA: NEGATIVE mg/dL
Hgb urine dipstick: NEGATIVE
Ketones, ur: NEGATIVE mg/dL
Leukocytes, UA: NEGATIVE
Nitrite: NEGATIVE
Protein, ur: NEGATIVE mg/dL
Specific Gravity, Urine: 1.02 (ref 1.005–1.030)
Urobilinogen, UA: 0.2 mg/dL (ref 0.0–1.0)
pH: 7 (ref 5.0–8.0)

## 2013-02-18 LAB — BASIC METABOLIC PANEL
BUN: 10 mg/dL (ref 6–23)
CO2: 28 mEq/L (ref 19–32)
Calcium: 9.1 mg/dL (ref 8.4–10.5)
Chloride: 104 mEq/L (ref 96–112)
Creatinine, Ser: 0.73 mg/dL (ref 0.50–1.10)
GFR calc Af Amer: >90 mL/min (ref >90)
GFR calc non Af Amer: >90 mL/min (ref >90)
Glucose, Bld: 106 mg/dL — ABNORMAL HIGH (ref 70–99)
Potassium: 3.9 mEq/L (ref 3.7–5.3)
Sodium: 142 mEq/L (ref 137–147)

## 2013-02-18 LAB — PREGNANCY, URINE: Preg Test, Ur: NEGATIVE

## 2013-02-18 MED ORDER — SODIUM CHLORIDE 0.9 % IV BOLUS (SEPSIS)
1000.0000 mL | Freq: Once | INTRAVENOUS | Status: AC
  Administered 2013-02-18: 1000 mL via INTRAVENOUS

## 2013-02-18 MED ORDER — KETOROLAC TROMETHAMINE 30 MG/ML IJ SOLN
30.0000 mg | Freq: Once | INTRAMUSCULAR | Status: AC
  Administered 2013-02-18: 30 mg via INTRAVENOUS
  Filled 2013-02-18: qty 1

## 2013-02-18 NOTE — Discharge Instructions (Signed)
Follow up with dr. Sydell Axon if not improving.

## 2013-02-18 NOTE — ED Notes (Signed)
When pt was called back to go to her room. Pt was at vending machine. When pt was placed in room patient requested [place]. Dew.

## 2013-02-18 NOTE — ED Notes (Signed)
abd pain n/v/d x 5 days.

## 2013-02-18 NOTE — ED Provider Notes (Signed)
CSN: [id number]     Arrival date & time 02/18/13  1344 History   This chart was scribed for Maudry Diego, MD by Adriana Reams, ED Scribe. This patient was seen in room APA01/APA01 and the patient's care was started at 1738.   First MD Initiated Contact with Patient 02/18/13 1738     Chief Complaint  Patient presents with  . Vomiting  . Diarrhea  . Abdominal Cramping    Patient is a 33 y.o. female presenting with diarrhea. The history is provided by the patient. No language interpreter was used.  Diarrhea Quality:  Watery Severity:  Moderate Onset quality:  Gradual Number of episodes:  12-15 per night Duration:  5 days Timing:  Intermittent Progression:  Unchanged Relieved by:  None tried Worsened by:  Nothing tried Ineffective treatments:  None tried Associated symptoms: abdominal pain and vomiting   Associated symptoms: no chills and no fever   Abdominal pain:    Location:  Generalized   Quality:  Cramping   Severity:  Mild   Duration:  5 days   Timing:  Intermittent   Chronicity:  New Vomiting:    Number of occurrences:  1 (last night)   Severity:  Mild   Progression:  Resolved  HPI Comments: Robin Martin is a 33 y.o. female who presents to the Emergency Department complaining of 5 days of watery diarrhea that is worse at night (about 13 episodes per night) with associated abdominal cramping and cloudy urine. She began vomiting last night. She denies fevers, chills, hematochezia or any other symptoms.   Past Medical History  Diagnosis Date  . Hypertension   . Herpes   . Pregnancy induced hypertension   . Abnormal Pap smear   . Depression     post-partum depression  . Bipolar 1 disorder   . Heartburn in pregnancy   . GERD (gastroesophageal reflux disease)     h/o GERD- wt loss alleviated it  . Infection     urinary tract infection  . Chronic kidney disease     kidney stone at age 18  . Ovarian cyst     age 70  . Cancer 2005    LEEP-cervical cancer    Past Surgical History  Procedure Laterality Date  . Leep    . Cesarean section with bilateral tubal ligation Bilateral 04/09/2012    BTL not done  . Cesarean section      x2   Family History  Problem Relation Age of Onset  . Diabetes Mother   . Hypertension Mother   . Mental illness Mother   . Diabetes Father   . Hypertension Father   . Asthma Sister   . Diabetes Sister   . Diabetes Maternal Grandmother    History  Substance Use Topics  . Smoking status: Former Smoker -- 0.50 packs/day    Types: Cigarettes    Quit date: 03/22/2012  . Smokeless tobacco: Never Used  . Alcohol Use: No   OB History   Grav Para Term Preterm Abortions TAB SAB Ect Mult Living   3 3 3  0 0 0 0 0 0 3     Review of Systems  Constitutional: Negative for fever, chills, appetite change and fatigue.  HENT: Negative for congestion, ear discharge and sinus pressure.   Eyes: Negative for discharge.  Respiratory: Negative for cough.   Gastrointestinal: Positive for vomiting, abdominal pain, diarrhea and abdominal distention. Negative for blood in stool.  Genitourinary: Negative for frequency and hematuria.  Musculoskeletal: Negative for back pain.  Skin: Negative for rash.  Neurological: Negative for seizures.  Psychiatric/Behavioral: Negative for hallucinations.    Allergies  Chocolate; Latex; and Morphine and related  Home Medications   Current Outpatient Rx  Name  Route  Sig  Dispense  Refill  . dexamethasone (DECADRON) 6 MG tablet      1 po bid with food   12 tablet   0   . etonogestrel-ethinyl estradiol (NUVARING) 0.12-0.015 MG/24HR vaginal ring   Vaginal   Place 1 each vaginally every 28 (twenty-eight) days. Insert vaginally and leave in place for 3 consecutive weeks, then remove for 1 week.         Marland Kitchen HYDROcodone-acetaminophen (NORCO) 5-325 MG per tablet   Oral   Take 1 tablet by mouth every 6 (six) hours as needed for moderate pain.   20 tablet   0   . methocarbamol  (ROBAXIN) 500 MG tablet   Oral   Take 1 tablet (500 mg total) by mouth 3 (three) times daily.   21 tablet   0    Triage Vitals; BP 143/88  Pulse 79  Temp(Src) 98.7 F (37.1 C) (Oral)  Resp 16  SpO2 99%  LMP 01/02/2013   Physical Exam  Constitutional: She is oriented to person, place, and time. She appears well-developed.  HENT:  Head: Normocephalic.  Eyes: Conjunctivae and EOM are normal. No scleral icterus.  Neck: Neck supple. No thyromegaly present.  Cardiovascular: Normal rate and regular rhythm.  Exam reveals no gallop and no friction rub.   No murmur heard. Pulmonary/Chest: No stridor. She has no wheezes. She has no rales. She exhibits no tenderness.  Abdominal: She exhibits distension. There is tenderness. There is no rebound.  Minimal abdominal tenderness and distention.  Musculoskeletal: Normal range of motion. She exhibits no edema.  Lymphadenopathy:    She has no cervical adenopathy.  Neurological: She is oriented to person, place, and time. She exhibits normal muscle tone. Coordination normal.  Skin: No rash noted. No erythema.  Psychiatric: She has a normal mood and affect. Her behavior is normal.    ED Course  Procedures (including critical care time) DIAGNOSTIC STUDIES: Oxygen Saturation is 99% on RA, normal by my interpretation.    COORDINATION OF CARE: 5:50 PM Discussed treatment plan which includes CBC with differential, basic metabolic panel, urinalysis, pregnancy test, .9% NaCl bolus 1000 mL, 30 mg Toradol injection, and abdominal xray with pt at bedside and pt agreed to plan.   7:10 PM Rechecked pt. She denies having a PCP. Advised pt to follow up with GI specialist, referral given. Advised pt to stay hydrated and to take Tylenol or Advil for pain.   Labs Review Labs Reviewed  CBC WITH DIFFERENTIAL - Abnormal; Notable for the following:    WBC 11.5 (*)    Neutro Abs 7.9 (*)    All other components within normal limits  BASIC METABOLIC PANEL -  Abnormal; Notable for the following:    Glucose, Bld 106 (*)    All other components within normal limits  URINALYSIS, ROUTINE W REFLEX MICROSCOPIC  PREGNANCY, URINE   Imaging Review Dg Abd Acute W/chest  02/18/2013   CLINICAL DATA:  Diarrhea and nausea.  EXAM: ACUTE ABDOMEN SERIES (ABDOMEN 2 VIEW & CHEST 1 VIEW)  COMPARISON:  None.  FINDINGS: Chest shows clear lungs. The heart size is normal. Abdominal films show no evidence of bowel obstruction or ileus. No free air is identified. No abnormal calcifications are seen.  Visualized bony structures are unremarkable.  IMPRESSION: Negative abdominal radiographs.  No acute cardiopulmonary disease.   Electronically Signed   By: Aletta Edouard M.D.   On: 02/18/2013 18:26    EKG Interpretation   None       MDM  Gastroenteritis  The chart was scribed for me under my direct supervision.  I personally performed the history, physical, and medical decision making and all procedures in the evaluation of this patient.Maudry Diego, MD 02/18/13 [phone]

## 2013-02-20 ENCOUNTER — Encounter: Payer: Self-pay | Admitting: Gastroenterology

## 2013-03-13 ENCOUNTER — Ambulatory Visit: Payer: Self-pay | Admitting: Gastroenterology

## 2013-03-21 ENCOUNTER — Ambulatory Visit: Payer: Self-pay | Admitting: Gastroenterology

## 2013-04-11 ENCOUNTER — Telehealth: Payer: Self-pay | Admitting: Gastroenterology

## 2013-04-11 ENCOUNTER — Encounter (HOSPITAL_COMMUNITY): Payer: Self-pay | Admitting: Emergency Medicine

## 2013-04-11 ENCOUNTER — Emergency Department (HOSPITAL_COMMUNITY)
Admission: EM | Admit: 2013-04-11 | Discharge: 2013-04-11 | Disposition: A | Discharge status: Home / Self Care | Payer: Medicaid Other | Attending: Emergency Medicine | Admitting: Emergency Medicine

## 2013-04-11 ENCOUNTER — Ambulatory Visit: Payer: Self-pay | Admitting: Gastroenterology

## 2013-04-11 DIAGNOSIS — Z8742 Personal history of other diseases of the female genital tract: Secondary | ICD-10-CM | POA: Insufficient documentation

## 2013-04-11 DIAGNOSIS — N189 Chronic kidney disease, unspecified: Secondary | ICD-10-CM | POA: Insufficient documentation

## 2013-04-11 DIAGNOSIS — Z8619 Personal history of other infectious and parasitic diseases: Secondary | ICD-10-CM | POA: Insufficient documentation

## 2013-04-11 DIAGNOSIS — Z8659 Personal history of other mental and behavioral disorders: Secondary | ICD-10-CM | POA: Insufficient documentation

## 2013-04-11 DIAGNOSIS — Z3202 Encounter for pregnancy test, result negative: Secondary | ICD-10-CM | POA: Insufficient documentation

## 2013-04-11 DIAGNOSIS — I129 Hypertensive chronic kidney disease with stage 1 through stage 4 chronic kidney disease, or unspecified chronic kidney disease: Secondary | ICD-10-CM | POA: Insufficient documentation

## 2013-04-11 DIAGNOSIS — Z87891 Personal history of nicotine dependence: Secondary | ICD-10-CM | POA: Insufficient documentation

## 2013-04-11 DIAGNOSIS — Z87440 Personal history of urinary (tract) infections: Secondary | ICD-10-CM | POA: Insufficient documentation

## 2013-04-11 DIAGNOSIS — Z91040 Latex allergy status: Secondary | ICD-10-CM | POA: Insufficient documentation

## 2013-04-11 DIAGNOSIS — Z8541 Personal history of malignant neoplasm of cervix uteri: Secondary | ICD-10-CM | POA: Insufficient documentation

## 2013-04-11 DIAGNOSIS — K529 Noninfective gastroenteritis and colitis, unspecified: Secondary | ICD-10-CM

## 2013-04-11 DIAGNOSIS — K5289 Other specified noninfective gastroenteritis and colitis: Secondary | ICD-10-CM | POA: Insufficient documentation

## 2013-04-11 DIAGNOSIS — Z79899 Other long term (current) drug therapy: Secondary | ICD-10-CM | POA: Insufficient documentation

## 2013-04-11 LAB — CBC WITH DIFFERENTIAL/PLATELET
Basophils Absolute: 0 10*3/uL (ref 0.0–0.1)
Basophils Relative: 0 % (ref 0–1)
Eosinophils Absolute: 0.2 10*3/uL (ref 0.0–0.7)
Eosinophils Relative: 3 % (ref 0–5)
HCT: 42.5 % (ref 36.0–46.0)
Hemoglobin: 14.6 g/dL (ref 12.0–15.0)
Lymphocytes Relative: 21 % (ref 12–46)
Lymphs Abs: 1.5 10*3/uL (ref 0.7–4.0)
MCH: 30.8 pg (ref 26.0–34.0)
MCHC: 34.4 g/dL (ref 30.0–36.0)
MCV: 89.7 fL (ref 78.0–100.0)
Monocytes Absolute: 0.5 10*3/uL (ref 0.1–1.0)
Monocytes Relative: 6 % (ref 3–12)
Neutro Abs: 4.9 10*3/uL (ref 1.7–7.7)
Neutrophils Relative %: 69 % (ref 43–77)
Platelets: 202 10*3/uL (ref 150–400)
RBC: 4.74 MIL/uL (ref 3.87–5.11)
RDW: 12.8 % (ref 11.5–15.5)
WBC: 7.1 10*3/uL (ref 4.0–10.5)

## 2013-04-11 LAB — URINALYSIS, ROUTINE W REFLEX MICROSCOPIC
Glucose, UA: NEGATIVE mg/dL
Ketones, ur: 15 mg/dL — AB
Leukocytes, UA: NEGATIVE
Nitrite: NEGATIVE
Specific Gravity, Urine: >1.03 — ABNORMAL HIGH (ref 1.005–1.030)
Urobilinogen, UA: 0.2 mg/dL (ref 0.0–1.0)
pH: 6 (ref 5.0–8.0)

## 2013-04-11 LAB — COMPREHENSIVE METABOLIC PANEL
ALT: 14 U/L (ref 0–35)
AST: 16 U/L (ref 0–37)
Albumin: 4 g/dL (ref 3.5–5.2)
Alkaline Phosphatase: 52 U/L (ref 39–117)
BUN: 11 mg/dL (ref 6–23)
CO2: 21 mEq/L (ref 19–32)
Calcium: 9.3 mg/dL (ref 8.4–10.5)
Chloride: 110 mEq/L (ref 96–112)
Creatinine, Ser: 0.71 mg/dL (ref 0.50–1.10)
GFR calc Af Amer: >90 mL/min (ref >90)
GFR calc non Af Amer: >90 mL/min (ref >90)
Glucose, Bld: 92 mg/dL (ref 70–99)
Potassium: 3.8 mEq/L (ref 3.7–5.3)
Sodium: 145 mEq/L (ref 137–147)
Total Bilirubin: 1.2 mg/dL (ref 0.3–1.2)
Total Protein: 7.8 g/dL (ref 6.0–8.3)

## 2013-04-11 LAB — URINE MICROSCOPIC-ADD ON

## 2013-04-11 LAB — PREGNANCY, URINE: Preg Test, Ur: NEGATIVE

## 2013-04-11 LAB — LIPASE, BLOOD: Lipase: 16 U/L (ref 11–59)

## 2013-04-11 MED ORDER — SODIUM CHLORIDE 0.9 % IV BOLUS (SEPSIS)
1000.0000 mL | Freq: Once | INTRAVENOUS | Status: AC
  Administered 2013-04-11: 1000 mL via INTRAVENOUS

## 2013-04-11 MED ORDER — ONDANSETRON HCL 4 MG PO TABS: 4.0000 mg | ORAL_TABLET | Freq: Four times a day (QID) | ORAL | Status: DC

## 2013-04-11 MED ORDER — ONDANSETRON HCL 4 MG/2ML IJ SOLN
4.0000 mg | Freq: Once | INTRAMUSCULAR | Status: AC
Start: 2013-04-11 — End: 2013-04-11
  Administered 2013-04-11: 4 mg via INTRAVENOUS
  Filled 2013-04-11: qty 2

## 2013-04-11 NOTE — Discharge Instructions (Signed)
Viral Gastroenteritis Viral gastroenteritis is also known as stomach flu. This condition affects the stomach and intestinal tract. It can cause sudden diarrhea and vomiting. The illness typically lasts 3 to 8 days. Most people develop an immune response that eventually gets rid of the virus. While this natural response develops, the virus can make you quite ill. CAUSES  Many different viruses can cause gastroenteritis, such as rotavirus or noroviruses. You can catch one of these viruses by consuming contaminated food or water. You may also catch a virus by sharing utensils or other personal items with an infected person or by touching a contaminated surface. SYMPTOMS  The most common symptoms are diarrhea and vomiting. These problems can cause a severe loss of body fluids (dehydration) and a body salt (electrolyte) imbalance. Other symptoms may include:  Fever.  Headache.  Fatigue.  Abdominal pain. DIAGNOSIS  Your caregiver can usually diagnose viral gastroenteritis based on your symptoms and a physical exam. A stool sample may also be taken to test for the presence of viruses or other infections. TREATMENT  This illness typically goes away on its own. Treatments are aimed at rehydration. The most serious cases of viral gastroenteritis involve vomiting so severely that you are not able to keep fluids down. In these cases, fluids must be given through an intravenous line (IV). HOME CARE INSTRUCTIONS   Drink enough fluids to keep your urine clear or pale yellow. Drink small amounts of fluids frequently and increase the amounts as tolerated.  Ask your caregiver for specific rehydration instructions.  Avoid:  Foods high in sugar.  Alcohol.  Carbonated drinks.  Tobacco.  Juice.  Caffeine drinks.  Extremely hot or cold fluids.  Fatty, greasy foods.  Too much intake of anything at one time.  Dairy products until 24 to 48 hours after diarrhea stops.  You may consume probiotics.  Probiotics are active cultures of beneficial bacteria. They may lessen the amount and number of diarrheal stools in adults. Probiotics can be found in yogurt with active cultures and in supplements.  Wash your hands well to avoid spreading the virus.  Only take over-the-counter or prescription medicines for pain, discomfort, or fever as directed by your caregiver. Do not give aspirin to children. Antidiarrheal medicines are not recommended.  Ask your caregiver if you should continue to take your regular prescribed and over-the-counter medicines.  Keep all follow-up appointments as directed by your caregiver. SEEK IMMEDIATE MEDICAL CARE IF:   You are unable to keep fluids down.  You do not urinate at least once every 6 to 8 hours.  You develop shortness of breath.  You notice blood in your stool or vomit. This may look like coffee grounds.  You have abdominal pain that increases or is concentrated in one small area (localized).  You have persistent vomiting or diarrhea.  You have a fever.  The patient is a child younger than 3 months, and he or she has a fever.  The patient is a child older than 3 months, and he or she has a fever and persistent symptoms.  The patient is a child older than 3 months, and he or she has a fever and symptoms suddenly get worse.  The patient is a baby, and he or she has no tears when crying. MAKE SURE YOU:   Understand these instructions.  Will watch your condition.  Will get help right away if you are not doing well or get worse. Document Released: 01/10/2005 Document Revised: 04/04/2011 Document Reviewed: 10/27/2010   [employer] Patient Information 2014 [employer]  

## 2013-04-11 NOTE — Telephone Encounter (Signed)
Please send letter.

## 2013-04-11 NOTE — ED Provider Notes (Signed)
CSN: [id number]     Arrival date & time 04/11/13  1000 History   First MD Initiated Contact with Patient 04/11/13 1011     Chief Complaint  Patient presents with  . Emesis      The history is provided by the patient. No language interpreter was used.    HPI Comments: Robin Martin is a 33 y.o. female who presents to the Emergency Department complaining of worsening emesis, she "cannot" keep anything down and that this has been present for 72 hours. Pt reports associated diarrhea, nausea, and abdominal pain that radiates "all over". Pt reports that had 2 csections. Pt denies hematuria,and hematemesis. Pt reprots that she is "hardly urinating". Denies any travel or antibiotics use. No sick contacts.   Past Medical History  Diagnosis Date  . Hypertension   . Herpes   . Pregnancy induced hypertension   . Abnormal Pap smear   . Depression     post-partum depression  . Bipolar 1 disorder   . Heartburn in pregnancy   . GERD (gastroesophageal reflux disease)     h/o GERD- wt loss alleviated it  . Infection     urinary tract infection  . Chronic kidney disease     kidney stone at age 57  . Ovarian cyst     age 68  . Cancer 2005    LEEP-cervical cancer   Past Surgical History  Procedure Laterality Date  . Leep    . Cesarean section with bilateral tubal ligation Bilateral 04/09/2012    BTL not done  . Cesarean section      x2   Family History  Problem Relation Age of Onset  . Diabetes Mother   . Hypertension Mother   . Mental illness Mother   . Diabetes Father   . Hypertension Father   . Asthma Sister   . Diabetes Sister   . Diabetes Maternal Grandmother    History  Substance Use Topics  . Smoking status: Former Smoker -- 0.50 packs/day    Types: Cigarettes    Quit date: 03/22/2012  . Smokeless tobacco: Never Used  . Alcohol Use: No   OB History   Grav Para Term Preterm Abortions TAB SAB Ect Mult Living   3 3 3  0 0 0 0 0 0 3     Review of Systems   Constitutional: Positive for appetite change.  Gastrointestinal: Positive for nausea, vomiting and diarrhea.      Allergies  Chocolate; Latex; and Morphine and related  Home Medications   Current Outpatient Rx  Name  Route  Sig  Dispense  Refill  . etonogestrel-ethinyl estradiol (NUVARING) 0.12-0.015 MG/24HR vaginal ring   Vaginal   Place 1 each vaginally every 28 (twenty-eight) days. Insert vaginally and leave in place for 3 consecutive weeks, then remove for 1 week.         . ondansetron (ZOFRAN) 4 MG tablet   Oral   Take 1 tablet (4 mg total) by mouth every 6 (six) hours.   12 tablet   0    BP 131/89  Pulse 68  Temp(Src) 98.3 F (36.8 C) (Oral)  Resp 20  Ht 5\' 7"  (1.702 m)  Wt 241 lb (109.317 kg)  BMI 37.74 kg/m2  SpO2 100%  LMP 04/10/2013 Physical Exam  Nursing note and vitals reviewed. Constitutional: She is oriented to person, place, and time. She appears well-developed and well-nourished. She is active.  Non-toxic appearance.  HENT:  Head: Atraumatic.  Eyes: Pupils are  equal, round, and reactive to light.  Neck: Normal range of motion.  Cardiovascular: Normal rate, regular rhythm, normal heart sounds and intact distal pulses.   Pulmonary/Chest: Effort normal and breath sounds normal.  Abdominal: Soft. Normal appearance. There is tenderness (general) in the epigastric area. There is no rebound and no guarding.  diffusely tender No guarding, no rebound Worsening epigastric pain Negative murphy's sign   Musculoskeletal: Normal range of motion.  Neurological: She is alert and oriented to person, place, and time. She has normal reflexes.  CN 2-12 intact, no ataxia on finger to nose, no nystagmus, 5/5 strength throughout, no pronator drift, Romberg negative, normal gait.   Skin: Skin is warm.    ED Course  Procedures (including critical care time)  DIAGNOSTIC STUDIES: Oxygen Saturation is 100% on RA, normal by my interpretation.    COORDINATION OF  CARE:  3:15 PM-Discussed treatment plan which includes  (CXR, CBC panel, CMP, UA) with pt at bedside and pt agreed to plan.      Labs Review Labs Reviewed  URINALYSIS, ROUTINE W REFLEX MICROSCOPIC - Abnormal; Notable for the following:    Specific Gravity, Urine >1.030 (*)    Hgb urine dipstick TRACE (*)    Bilirubin Urine SMALL (*)    Ketones, ur 15 (*)    Protein, ur TRACE (*)    All other components within normal limits  URINE MICROSCOPIC-ADD ON - Abnormal; Notable for the following:    Squamous Epithelial / LPF FEW (*)    Bacteria, UA FEW (*)    All other components within normal limits  CBC WITH DIFFERENTIAL  COMPREHENSIVE METABOLIC PANEL  LIPASE, BLOOD  PREGNANCY, URINE   Imaging Review No results found.   EKG Interpretation None      MDM   Final diagnoses:  Gastroenteritis   three-day history of nausea, vomiting and diarrhea patient states she cannot keep anything down. No fever. No sick contacts. No recent travel or antibiotic use.  Abdomen soft without peritoneal signs. UA with ketones. Labs unremarkable otherwise. Patient given IV fluids and antiemetics  We'll attempt by mouth hydration.  LFTs and lipase unremarkable. Patient tolerating by mouth in the ED without any vomiting. Abdomen remains soft and nontender. No right upper quadrant tenderness. Low suspicion for cholecystitis or appendicitis. Suspect viral syndrome. Patient instructed on PO hydration at home. Return precautions discussed.  BP 130/74  Pulse 66  Temp(Src) 98.8 F (37.1 C) (Oral)  Resp 18  Ht 5\' 7"  (1.702 m)  Wt 241 lb (109.317 kg)  BMI 37.74 kg/m2  SpO2 95%  LMP 04/10/2013  I personally performed the services described in this documentation, which was scribed in my presence. The recorded information has been reviewed and is accurate.       Ezequiel Essex, MD 04/11/13 [phone]

## 2013-04-11 NOTE — ED Notes (Signed)
Pt given sprite to drink. 

## 2013-04-11 NOTE — Care Management Note (Signed)
ED/CM noted patient did not have health insurance and/or PCP listed in the computer.  Patient was given the [place] resource handout with information on the clinics, food pantries, and the handout for new health insurance sign-up.  Patient expressed appreciation for information received. 

## 2013-04-11 NOTE — Telephone Encounter (Signed)
Pt was a no show

## 2013-04-11 NOTE — ED Notes (Signed)
Pt co N/V/D x 72 hrs.

## 2013-04-16 ENCOUNTER — Encounter: Payer: Self-pay | Admitting: Gastroenterology

## 2013-04-16 NOTE — Telephone Encounter (Signed)
Mailed letter °

## 2013-09-09 IMAGING — US US TRANSVAGINAL NON-OB
1 series · 14 of 25 positions shown · non-contrast
Comparison: Obstetric ultrasound 12/14/2011

CLINICAL DATA: Dysfunctional uterine bleeding.  The patient is 32
years old.  LMP 08/11/2012.



[Series 1: us pelvis complete · 14 of 50 slices shown]
[im 1/50]
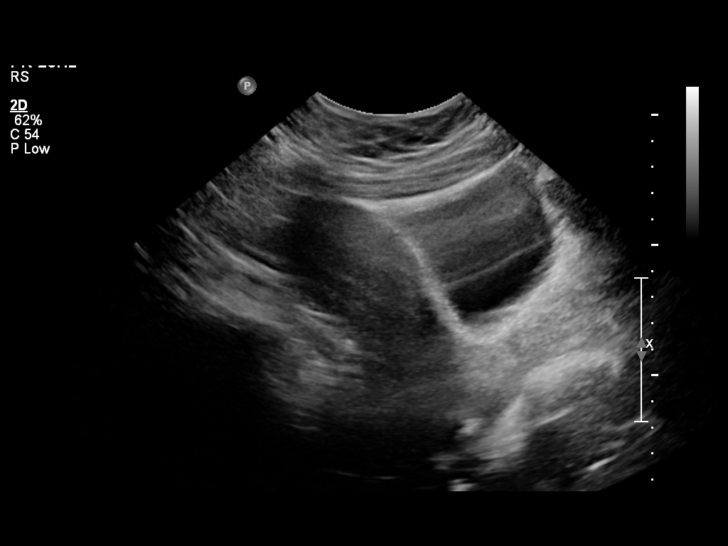
[im 5/50]
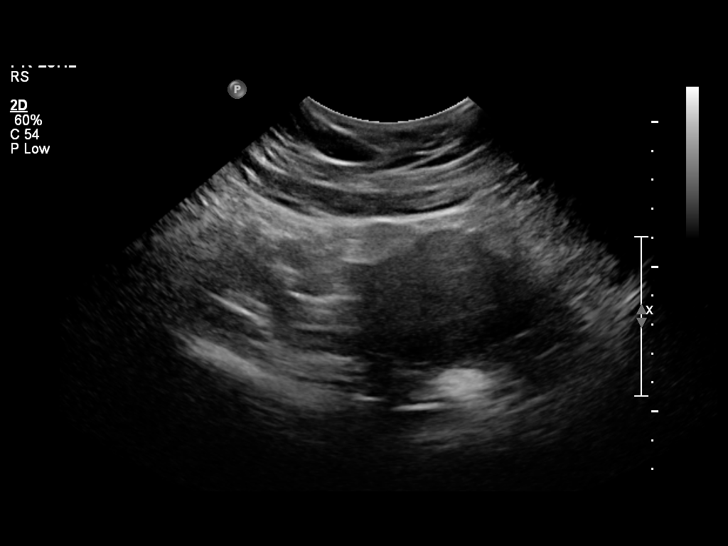
[im 9/50]
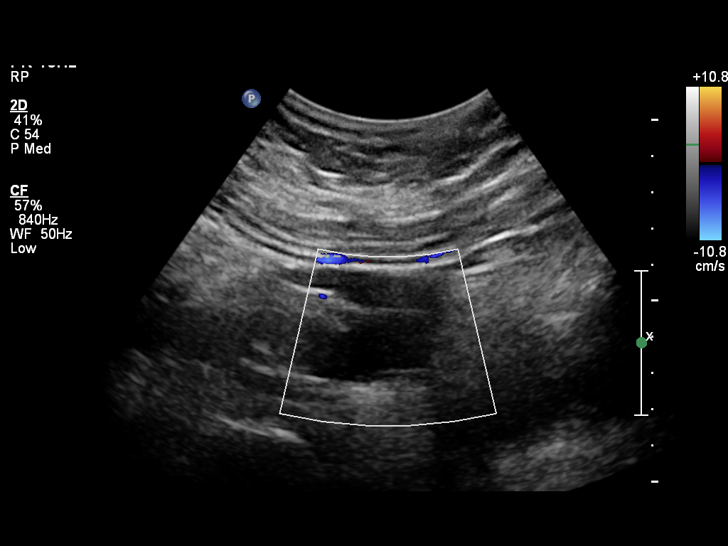
[im 13/50]
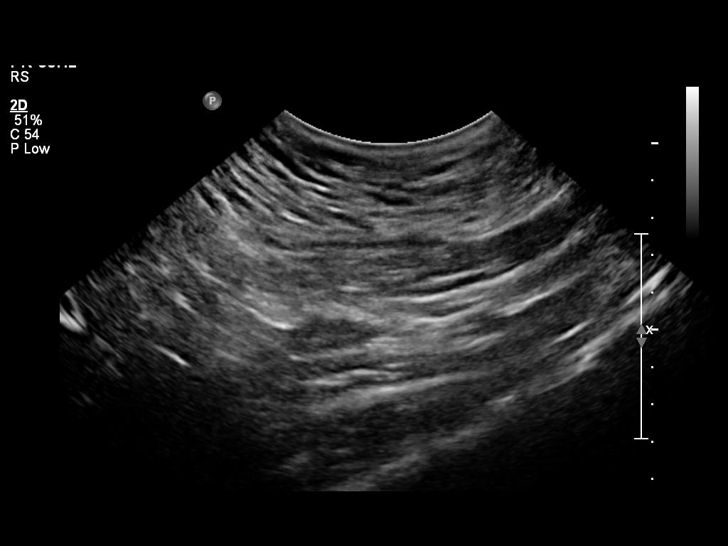
[im 17/50]
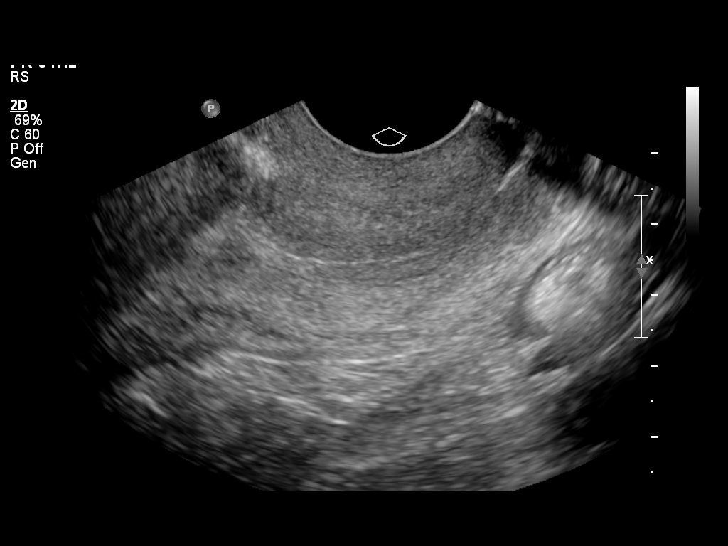
[im 19/50]
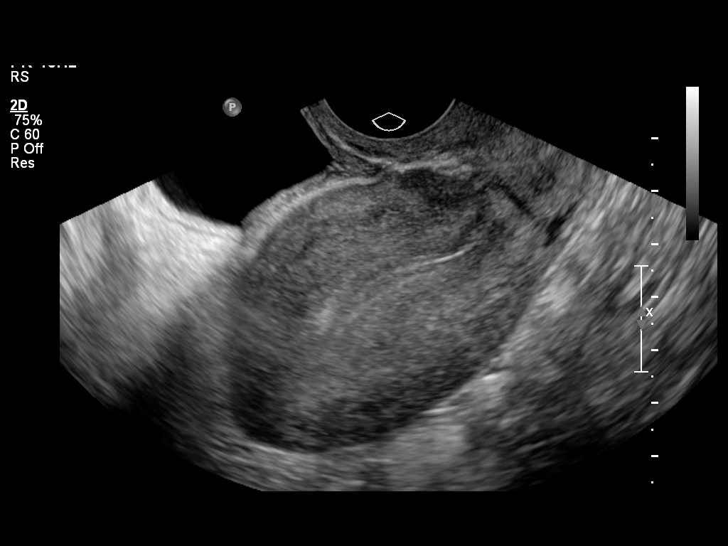
[im 23/50]
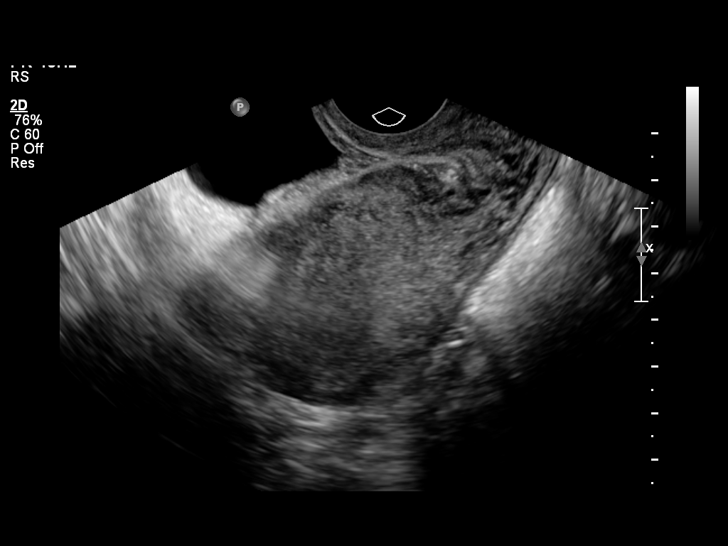
[im 27/50]
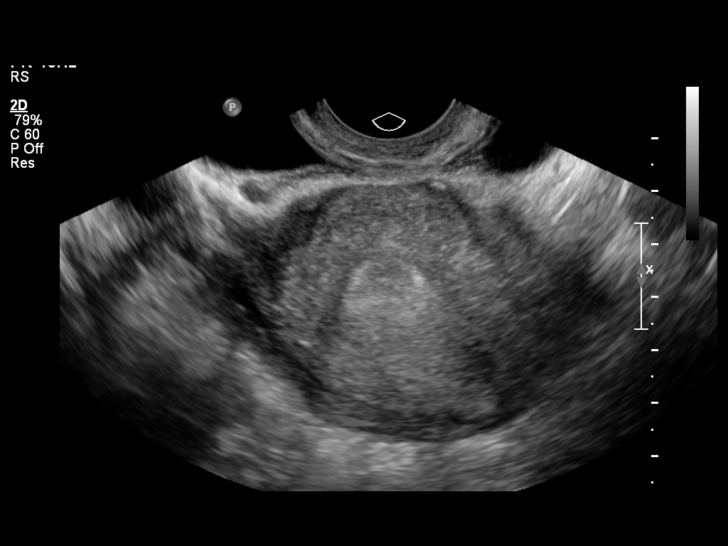
[im 31/50]
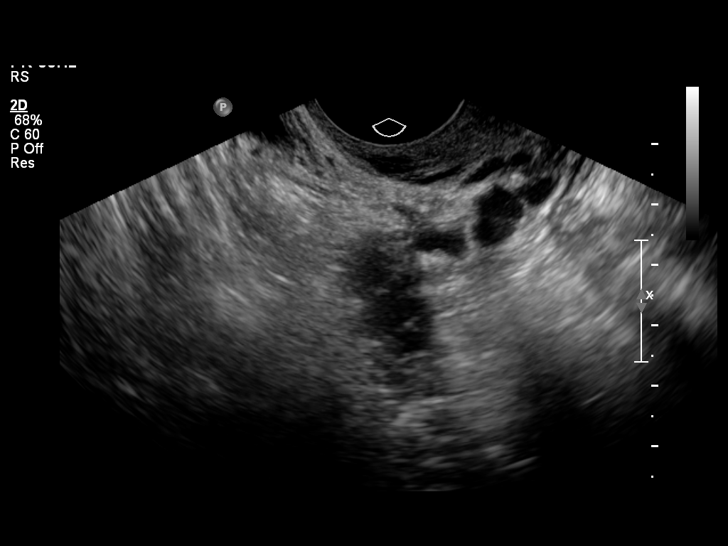
[im 33/50]
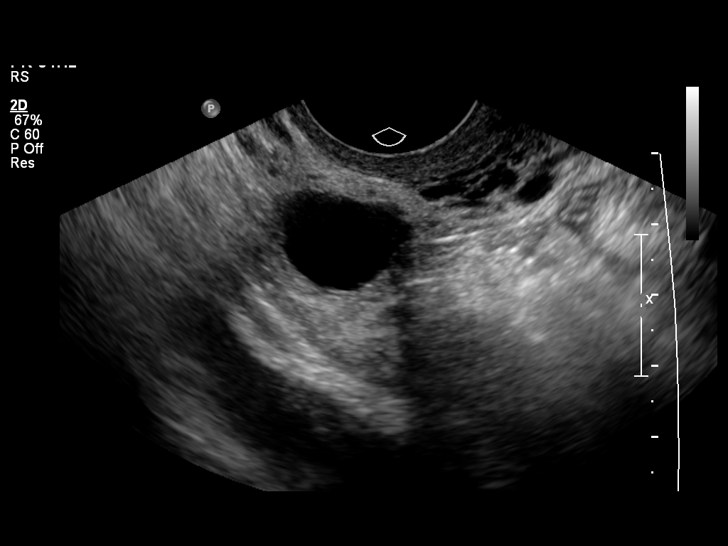
[im 37/50]
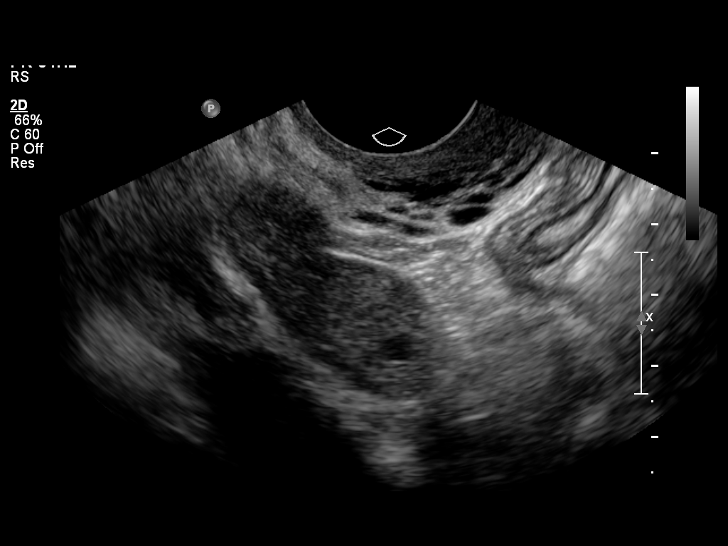
[im 41/50]
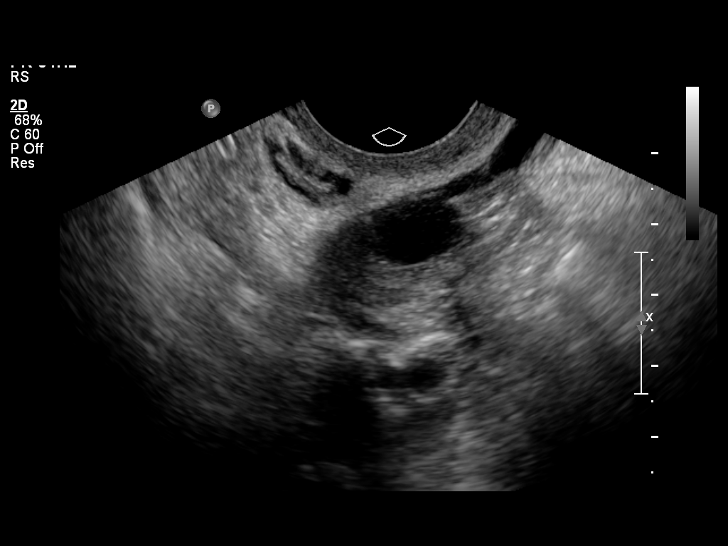
[im 45/50]
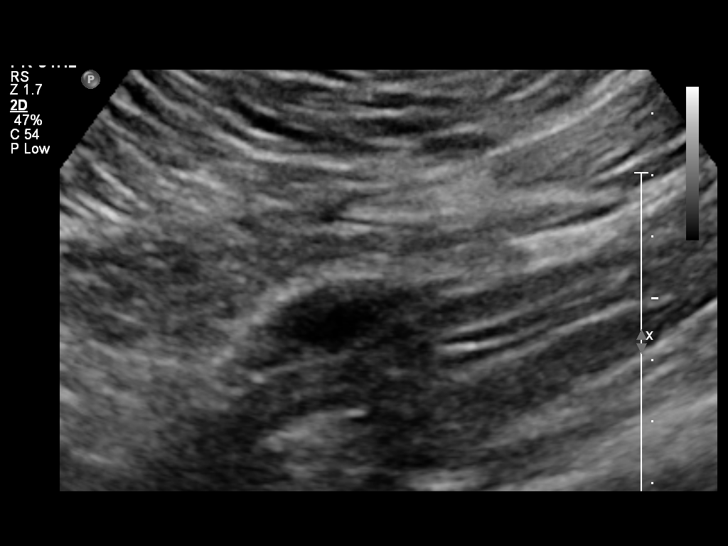
[im 50/50]
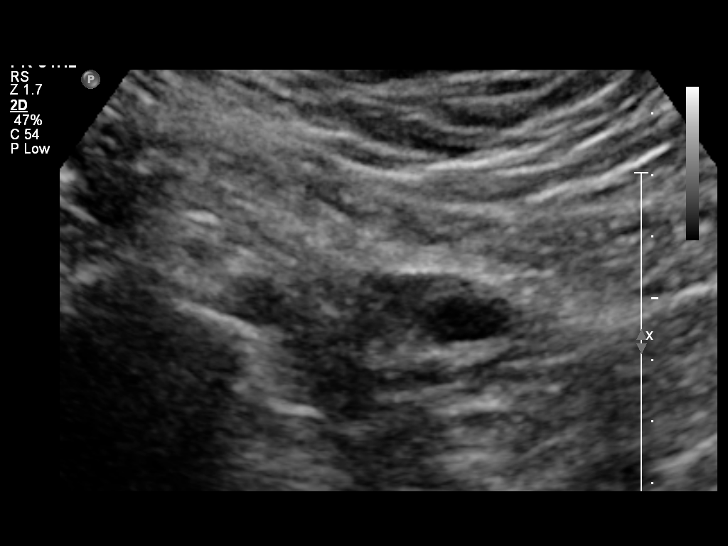

[14 of 25 positions shown; findings below may reference images not displayed]

FINDINGS: Uterus: Anteverted and measures 10.7 x 4.8 x 6.7 cm.  Normal
echotexture.  No focal uterine mass is identified.

Endometrium: Normal in thickness and appearance.  Measures 9 mm in
thickness.

Right ovary:  Normal appearance/no adnexal mass.  Contains a
dominant follicle.

Left ovary: Normal appearance/no adnexal mass.

Other findings: No free fluid
IMPRESSION: Normal study. No evidence of pelvic mass or other significant
abnormality.

## 2013-11-11 ENCOUNTER — Emergency Department (HOSPITAL_COMMUNITY)
Admission: EM | Admit: 2013-11-11 | Discharge: 2013-11-11 | Disposition: A | Discharge status: Home / Self Care | Payer: Medicaid Other | Attending: Emergency Medicine | Admitting: Emergency Medicine

## 2013-11-11 ENCOUNTER — Encounter (HOSPITAL_COMMUNITY): Payer: Self-pay | Admitting: Emergency Medicine

## 2013-11-11 DIAGNOSIS — M25551 Pain in right hip: Secondary | ICD-10-CM | POA: Diagnosis present

## 2013-11-11 DIAGNOSIS — Z91040 Latex allergy status: Secondary | ICD-10-CM | POA: Diagnosis not present

## 2013-11-11 DIAGNOSIS — Z8659 Personal history of other mental and behavioral disorders: Secondary | ICD-10-CM | POA: Insufficient documentation

## 2013-11-11 DIAGNOSIS — M5431 Sciatica, right side: Secondary | ICD-10-CM | POA: Diagnosis not present

## 2013-11-11 DIAGNOSIS — N189 Chronic kidney disease, unspecified: Secondary | ICD-10-CM | POA: Diagnosis not present

## 2013-11-11 DIAGNOSIS — Z87891 Personal history of nicotine dependence: Secondary | ICD-10-CM | POA: Insufficient documentation

## 2013-11-11 DIAGNOSIS — Z8719 Personal history of other diseases of the digestive system: Secondary | ICD-10-CM | POA: Diagnosis not present

## 2013-11-11 DIAGNOSIS — Z8541 Personal history of malignant neoplasm of cervix uteri: Secondary | ICD-10-CM | POA: Insufficient documentation

## 2013-11-11 DIAGNOSIS — I129 Hypertensive chronic kidney disease with stage 1 through stage 4 chronic kidney disease, or unspecified chronic kidney disease: Secondary | ICD-10-CM | POA: Diagnosis not present

## 2013-11-11 DIAGNOSIS — Z8619 Personal history of other infectious and parasitic diseases: Secondary | ICD-10-CM | POA: Insufficient documentation

## 2013-11-11 MED ORDER — CYCLOBENZAPRINE HCL 10 MG PO TABS: 10.0000 mg | ORAL_TABLET | Freq: Two times a day (BID) | ORAL | Status: DC | PRN

## 2013-11-11 MED ORDER — NAPROXEN 500 MG PO TABS: 500.0000 mg | ORAL_TABLET | Freq: Two times a day (BID) | ORAL | Status: DC

## 2013-11-11 MED ORDER — HYDROCODONE-ACETAMINOPHEN 5-325 MG PO TABS: 1.0000 | ORAL_TABLET | ORAL | Status: DC | PRN

## 2013-11-11 NOTE — ED Notes (Signed)
Patient complaining of right hip pain radiating into right buttock down into thigh area x 3 days. Denies injury.

## 2013-11-11 NOTE — ED Notes (Signed)
Pain rt hip, buttock and mid thigh, for 3 days.  Numb sensation at times.  No known injury.

## 2013-11-11 NOTE — ED Provider Notes (Signed)
CSN: [id number]     Arrival date & time 11/11/13  1634 History  This chart was scribed for non-physician practitioner Debroah Baller, NP, working with Veryl Speak, MD by Zola Button, ED Scribe. This patient was seen in room APFT24/APFT24 and the patient's care was started at 6:37 PM.      Chief Complaint  Patient presents with  . Hip Pain      Patient is a 33 y.o. female presenting with hip pain. The history is provided by the patient. No language interpreter was used.  Hip Pain This is a new problem. The current episode started 2 days ago. The problem occurs constantly. The problem has not changed since onset.The symptoms are aggravated by bending and walking.   HPI Comments: Robin Martin is a 33 y.o. female who presents to the Emergency Department complaining of gradual onset, constant, burning and stabbing right hip pain radiating into the right buttock and thigh area that began 2 days ago. She notes associated numbness in the same area. Patient was at work when it happened, but does not know what caused the pain. She notes that the pain is worse with ambulation and movement. She notes mild relief when laying on her right side a certain way. Patient tried taking Ibuprofen yesterday, but did not take anything today because of work. 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 She denies urinary symptoms, chills and other symptoms. Patient notes having had centralized back pain after an epidural that hit a nerve in the past. She denies similar episodes previously.   Past Medical History  Diagnosis Date  . Hypertension   . Herpes   . Pregnancy induced hypertension   . Abnormal Pap smear   . Depression     post-partum depression  . Bipolar 1 disorder   . Heartburn in pregnancy   . GERD (gastroesophageal reflux disease)     h/o GERD- wt loss alleviated it  . Infection     urinary tract infection  . Chronic kidney disease      kidney stone at age 81  . Ovarian cyst     age 73  . Cancer 2005    LEEP-cervical cancer   Past Surgical History  Procedure Laterality Date  . Leep    . Cesarean section with bilateral tubal ligation Bilateral 04/09/2012    BTL not done  . Cesarean section      x2   Family History  Problem Relation Age of Onset  . Diabetes Mother   . Hypertension Mother   . Mental illness Mother   . Diabetes Father   . Hypertension Father   . Asthma Sister   . Diabetes Sister   . Diabetes Maternal Grandmother    History  Substance Use Topics  . Smoking status: Former Smoker -- 0.50 packs/day    Types: Cigarettes    Quit date: 03/22/2012  . Smokeless tobacco: Never Used  . Alcohol Use: No   OB History   Grav Para Term Preterm Abortions TAB SAB Ect Mult Living   3 3 3  0 0 0 0 0 0 3     Review of Systems  Constitutional: Negative for chills.  Genitourinary: Negative for dysuria, urgency and hematuria.  Musculoskeletal: Positive for arthralgias and myalgias.       Right hip pain  All other systems reviewed and are negative.     Allergies  Chocolate; Latex; and  Morphine and related  Home Medications   Prior to Admission medications   Not on File   BP 135/71  Pulse 66  Temp(Src) 99.1 F (37.3 C) (Oral)  Resp 18  Ht 5\' 7"  (1.702 m)  Wt 230 lb (104.327 kg)  BMI 36.01 kg/m2  SpO2 100%  LMP 11/04/2013 Physical Exam  Nursing note and vitals reviewed. Constitutional: She is oriented to person, place, and time. She appears well-developed and well-nourished. No distress.  HENT:  Head: Normocephalic and atraumatic.  Right Ear: Tympanic membrane normal.  Left Ear: Tympanic membrane normal.  Nose: Nose normal.  Mouth/Throat: Uvula is midline, oropharynx is clear and moist and mucous membranes are normal. No oropharyngeal exudate.  Eyes: EOM are normal. Pupils are equal, round, and reactive to light.  Neck: Normal range of motion. Neck supple.  Cardiovascular: Normal rate,  regular rhythm and normal heart sounds.   No murmur heard. Pulses are strong and equal.  Pulmonary/Chest: Effort normal. She has no wheezes. She has no rales.  Abdominal: Soft. Bowel sounds are normal. There is no tenderness.  Musculoskeletal: Normal range of motion. She exhibits no edema.       Back:  No TTP cervical, thoracic and lumbar spine. There is tenderness with palpation over the right sciatic nerve. The pain radiates to the right leg.   Neurological: She is alert and oriented to person, place, and time. She has normal strength. No cranial nerve deficit or sensory deficit. Gait normal.  Reflex Scores:      Bicep reflexes are 2+ on the right side and 2+ on the left side.      Brachioradialis reflexes are 2+ on the right side and 2+ on the left side.      Patellar reflexes are 2+ on the right side and 2+ on the left side.      Achilles reflexes are 2+ on the right side and 2+ on the left side. Good grips, equal and bilateral. Reflexes 2+ upper and lower.  Skin: Skin is warm and dry. No rash noted.  Psychiatric: She has a normal mood and affect. Her behavior is normal.    ED Course  Procedures  DIAGNOSTIC STUDIES: Oxygen Saturation is 100% on RA, nml by my interpretation.    COORDINATION OF CARE: 6:46 PM-Discussed treatment plan which includes anti-inflammatory medication, muscle relaxer, pain medication, and a work note with pt at bedside and pt agreed to plan.   Labs Review  MDM  33 y.o. female with right sciatic nerve pain that started 3 days ago. Stable for discharge without neuro deficits. Will treat for pain and muscle spasm she will follow up with her PCP or return here as needed for worsening symptoms.    Medication List         cyclobenzaprine 10 MG tablet  Commonly known as:  FLEXERIL  Take 1 tablet (10 mg total) by mouth 2 (two) times daily as needed for muscle spasms.     HYDROcodone-acetaminophen 5-325 MG per tablet  Commonly known as:  NORCO/VICODIN  Take  1 tablet by mouth every 4 (four) hours as needed.     naproxen 500 MG tablet  Commonly known as:  NAPROSYN  Take 1 tablet (500 mg total) by mouth 2 (two) times daily.       I personally performed the services described in this documentation, which was scribed in my presence. The recorded information has been reviewed and is accurate.    [facility] Bunnie Pion, [place] 11/12/13 [phone]

## 2013-11-14 NOTE — ED Provider Notes (Signed)
Medical screening examination/treatment/procedure(s) were performed by non-physician practitioner and as supervising physician I was immediately available for consultation/collaboration.   EKG Interpretation None       Veryl Speak, MD 11/14/13 [phone]

## 2013-11-25 ENCOUNTER — Encounter (HOSPITAL_COMMUNITY): Payer: Self-pay | Admitting: Emergency Medicine

## 2014-08-27 ENCOUNTER — Encounter: Payer: Medicaid Other | Attending: Obstetrics and Gynecology

## 2014-08-27 VITALS — Ht 67.0 in | Wt 313.1 lb

## 2014-08-27 DIAGNOSIS — O24419 Gestational diabetes mellitus in pregnancy, unspecified control: Secondary | ICD-10-CM | POA: Insufficient documentation

## 2014-08-27 DIAGNOSIS — Z713 Dietary counseling and surveillance: Secondary | ICD-10-CM | POA: Insufficient documentation

## 2014-08-27 NOTE — Progress Notes (Signed)
  Patient was seen on 08/27/14 for Gestational Diabetes self-management . The following learning objectives were met by the patient :   States the definition of Gestational Diabetes  States why dietary management is important in controlling blood glucose  Describes the effects of carbohydrates on blood glucose levels  Demonstrates ability to create a balanced meal plan  Demonstrates carbohydrate counting   States when to check blood glucose levels  Demonstrates proper blood glucose monitoring techniques  States the effect of stress and exercise on blood glucose levels  States the importance of limiting caffeine and abstaining from alcohol and smoking  Plan:  Aim for 2 Carb Choices per meal (30 grams) +/- 1 either way for breakfast Aim for 3 Carb Choices per meal (45 grams) +/- 1 either way from lunch and dinner Aim for 1-2 Carbs per snack Begin reading food labels for Total Carbohydrate and sugar grams of foods Consider  increasing your activity level by walking daily as tolerated Begin checking BG before breakfast and 2 hours after first bit of breakfast, lunch and dinner after  as directed by MD  Take medication  as directed by MD  Blood glucose monitor given: Accu-Chek Aviva Connect BG Monitoring Kit Lot # [id number] Exp: 06/24/15 Blood glucose reading: 153 2hpp croissant . Patient advised to modify diet, exercise and test as directed. Call MD office on Monday to report readings. Do not wait until scheduled visit. Robin Martin verbalized understanding.  Patient instructed to monitor glucose levels: FBS: 60 - <90 2 hour: <120  Patient received the following handouts:  Nutrition Diabetes and Pregnancy  Carbohydrate Counting List  Meal Planning worksheet  Patient will be seen for follow-up as needed.

## 2014-09-13 ENCOUNTER — Encounter (HOSPITAL_COMMUNITY): Payer: Self-pay | Admitting: *Deleted

## 2014-09-13 ENCOUNTER — Inpatient Hospital Stay (HOSPITAL_COMMUNITY)
Admission: AD | Admit: 2014-09-13 | Discharge: 2014-09-13 | Disposition: A | Discharge status: Home / Self Care | Payer: Medicaid Other | Source: Ambulatory Visit | Attending: Obstetrics and Gynecology | Admitting: Obstetrics and Gynecology

## 2014-09-13 DIAGNOSIS — Z87891 Personal history of nicotine dependence: Secondary | ICD-10-CM | POA: Diagnosis not present

## 2014-09-13 DIAGNOSIS — Z885 Allergy status to narcotic agent status: Secondary | ICD-10-CM | POA: Diagnosis not present

## 2014-09-13 DIAGNOSIS — Z9889 Other specified postprocedural states: Secondary | ICD-10-CM

## 2014-09-13 DIAGNOSIS — R42 Dizziness and giddiness: Secondary | ICD-10-CM | POA: Insufficient documentation

## 2014-09-13 DIAGNOSIS — O9989 Other specified diseases and conditions complicating pregnancy, childbirth and the puerperium: Secondary | ICD-10-CM | POA: Insufficient documentation

## 2014-09-13 DIAGNOSIS — Z79899 Other long term (current) drug therapy: Secondary | ICD-10-CM | POA: Diagnosis not present

## 2014-09-13 DIAGNOSIS — O24419 Gestational diabetes mellitus in pregnancy, unspecified control: Secondary | ICD-10-CM | POA: Diagnosis present

## 2014-09-13 DIAGNOSIS — IMO0002 Reserved for concepts with insufficient information to code with codable children: Secondary | ICD-10-CM | POA: Diagnosis not present

## 2014-09-13 DIAGNOSIS — Q270 Congenital absence and hypoplasia of umbilical artery: Secondary | ICD-10-CM

## 2014-09-13 DIAGNOSIS — O09299 Supervision of pregnancy with other poor reproductive or obstetric history, unspecified trimester: Secondary | ICD-10-CM

## 2014-09-13 DIAGNOSIS — R1030 Lower abdominal pain, unspecified: Secondary | ICD-10-CM | POA: Insufficient documentation

## 2014-09-13 DIAGNOSIS — M549 Dorsalgia, unspecified: Secondary | ICD-10-CM | POA: Insufficient documentation

## 2014-09-13 DIAGNOSIS — G8929 Other chronic pain: Secondary | ICD-10-CM | POA: Diagnosis not present

## 2014-09-13 DIAGNOSIS — Z3A30 30 weeks gestation of pregnancy: Secondary | ICD-10-CM | POA: Diagnosis not present

## 2014-09-13 DIAGNOSIS — O3421 Maternal care for scar from previous cesarean delivery: Secondary | ICD-10-CM | POA: Diagnosis not present

## 2014-09-13 DIAGNOSIS — Z833 Family history of diabetes mellitus: Secondary | ICD-10-CM | POA: Diagnosis not present

## 2014-09-13 DIAGNOSIS — F319 Bipolar disorder, unspecified: Secondary | ICD-10-CM | POA: Diagnosis not present

## 2014-09-13 DIAGNOSIS — Z98891 History of uterine scar from previous surgery: Secondary | ICD-10-CM

## 2014-09-13 DIAGNOSIS — Z8249 Family history of ischemic heart disease and other diseases of the circulatory system: Secondary | ICD-10-CM | POA: Insufficient documentation

## 2014-09-13 DIAGNOSIS — Z91040 Latex allergy status: Secondary | ICD-10-CM | POA: Diagnosis not present

## 2014-09-13 LAB — COMPREHENSIVE METABOLIC PANEL
ALT: 18 U/L (ref 14–54)
AST: 23 U/L (ref 15–41)
Albumin: 3.1 g/dL — ABNORMAL LOW (ref 3.5–5.0)
Alkaline Phosphatase: 77 U/L (ref 38–126)
Anion gap: 10 (ref 5–15)
BUN: 8 mg/dL (ref 6–20)
CO2: 21 mmol/L — ABNORMAL LOW (ref 22–32)
Calcium: 9 mg/dL (ref 8.9–10.3)
Chloride: 105 mmol/L (ref 101–111)
Creatinine, Ser: 0.49 mg/dL (ref 0.44–1.00)
GFR calc Af Amer: >60 mL/min (ref >60)
GFR calc non Af Amer: >60 mL/min (ref >60)
Glucose, Bld: 110 mg/dL — ABNORMAL HIGH (ref 65–99)
Potassium: 4 mmol/L (ref 3.5–5.1)
Sodium: 136 mmol/L (ref 135–145)
Total Bilirubin: 0.8 mg/dL (ref 0.3–1.2)
Total Protein: 6.4 g/dL — ABNORMAL LOW (ref 6.5–8.1)

## 2014-09-13 LAB — CBC
HCT: 34.1 % — ABNORMAL LOW (ref 36.0–46.0)
Hemoglobin: 11.5 g/dL — ABNORMAL LOW (ref 12.0–15.0)
MCH: 30 pg (ref 26.0–34.0)
MCHC: 33.7 g/dL (ref 30.0–36.0)
MCV: 89 fL (ref 78.0–100.0)
Platelets: 192 10*3/uL (ref 150–400)
RBC: 3.83 MIL/uL — ABNORMAL LOW (ref 3.87–5.11)
RDW: 13.5 % (ref 11.5–15.5)
WBC: 11 10*3/uL — ABNORMAL HIGH (ref 4.0–10.5)

## 2014-09-13 LAB — WET PREP, GENITAL
Clue Cells Wet Prep HPF POC: NONE SEEN
Trich, Wet Prep: NONE SEEN
Yeast Wet Prep HPF POC: NONE SEEN

## 2014-09-13 LAB — URINALYSIS, ROUTINE W REFLEX MICROSCOPIC
Bilirubin Urine: NEGATIVE
Glucose, UA: 250 mg/dL — AB
Hgb urine dipstick: NEGATIVE
Ketones, ur: 15 mg/dL — AB
Leukocytes, UA: NEGATIVE
Nitrite: NEGATIVE
Protein, ur: NEGATIVE mg/dL
Specific Gravity, Urine: 1.025 (ref 1.005–1.030)
Urobilinogen, UA: 0.2 mg/dL (ref 0.0–1.0)
pH: 5.5 (ref 5.0–8.0)

## 2014-09-13 LAB — PROTEIN / CREATININE RATIO, URINE
Creatinine, Urine: 198 mg/dL
Protein Creatinine Ratio: 0.09 mg/mg{Cre} (ref 0.00–0.15)
Total Protein, Urine: 17 mg/dL

## 2014-09-13 LAB — URIC ACID: Uric Acid, Serum: 3.1 mg/dL (ref 2.3–6.6)

## 2014-09-13 LAB — LACTATE DEHYDROGENASE: LDH: 115 U/L (ref 98–192)

## 2014-09-13 LAB — GROUP B STREP BY PCR: Group B strep by PCR: NEGATIVE

## 2014-09-13 LAB — FETAL FIBRONECTIN: Fetal Fibronectin: NEGATIVE

## 2014-09-13 MED ORDER — AZITHROMYCIN 250 MG PO TABS: ORAL_TABLET | ORAL | Status: AC

## 2014-09-13 MED ORDER — CETIRIZINE HCL 10 MG PO TABS: 10.0000 mg | ORAL_TABLET | Freq: Every day | ORAL | Status: DC

## 2014-09-13 NOTE — MAU Provider Note (Signed)
History   34 yo [id number] at 30 1/7 weeks presented unannounced c/o dizzy spell today, with previous recent occurrences, ear pressure, mild cramping.  Sx started today at work, when she stood up from her desk, and had a dizzy spell, threw up, and had cramping.  Denies leaking, bleeding, dysuria, syncope, sweating, dysuria, or any other issue.  Reports +FM.  Patient is a gestational diabetic, on Glyburide 5 mg po BID since 09/09/14--started med at 09/01/14 at 5 mg po q hs, then increased on 8/16.   Patient feels she has had an ear/sinus infection for approx 1 week, and believes that is causing her dizziness.  Plans repeat C/S, may want BTL at delivery--patient desires, husband doesn't.  Consent signed 08/26/14.  Patient Active Problem List   Diagnosis Date Noted  . Previous cesarean section x 2 09/13/2014  . Bipolar 1 disorder 09/13/2014  . Latex allergy 09/13/2014  .  Hx LGA (large for gestational age) fetus x 2 (9+) 09/13/2014  . Allergy to morphine 09/13/2014  . Gestational diabetes 09/13/2014  . 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 Hx of postpartum hemorrhage, currently pregnant--with 1st pregnancy, patient reports cardiac arrest 09/13/2014  . Chronic back pain 09/13/2014  . Obesity, Class III, BMI 40-49.9 (morbid obesity) 09/13/2014  . Rh negative, maternal 04/06/2012    Chief Complaint  Patient presents with  . Dizziness  . Emesis  . Abdominal Pain   HPI:  See above  OB History    Gravida Para Term Preterm AB TAB SAB Ectopic Multiple Living   4 3 3  0 0 0 0 0 0 3      Past Medical History  Diagnosis Date  . Hypertension   . Herpes   . Pregnancy induced hypertension   . Abnormal Pap smear   . Depression     post-partum depression  . Bipolar 1 disorder   . Heartburn in pregnancy   . GERD (gastroesophageal reflux disease)     h/o GERD- wt loss alleviated it  . Infection     urinary tract infection  . Chronic kidney disease     kidney stone at age 68  . Ovarian cyst     age  4  . Cancer 2005    LEEP-cervical cancer  . Gestational diabetes mellitus, antepartum     Past Surgical History  Procedure Laterality Date  . Leep    . Cesarean section with bilateral tubal ligation Bilateral 04/09/2012    BTL not done  . Cesarean section      x2    Family History  Problem Relation Age of Onset  . Diabetes Mother   . Hypertension Mother   . Mental illness Mother   . Diabetes Father   . Hypertension Father   . Asthma Sister   . Diabetes Sister   . Diabetes Maternal Grandmother     Social History  Substance Use Topics  . Smoking status: Former Smoker -- 0.50 packs/day    Types: Cigarettes    Quit date: 03/22/2012  . Smokeless tobacco: Never Used  . Alcohol Use: No    Allergies:  Allergies  Allergen Reactions  . Chocolate Other (See Comments)    Thins blood  . Latex Rash  . Morphine And Related Itching, Nausea And Vomiting and Rash    No prescriptions prior to admission    ROS:  Mild cramping, ear pressure, dizziness Physical Exam   Blood pressure 115/79, pulse 96, temperature 98.3 F (36.8 C), temperature source Oral, resp.  rate 18, weight 141.069 kg (311 lb), last menstrual period 02/14/2014.   Physical Exam  In NAD Ears--left ear full of cerumen, with drum slightly red Right ear with less cerumen, drum moderately red + sinus pain on palpation of frontal sinuses Nasal turbinates red and boggy Throat clear Chest clear Heart RRR without murmur Abd gravid, NT Pelvic--cervix closed, posterior, and long.  No d/c in vault. Ext WNL  FHR Category 1 Very occasional irritability  Results for orders placed or performed during the hospital encounter of 09/13/14 (from the past 24 hour(s))  Urinalysis, Routine w reflex microscopic (not at [facility])     Status: Abnormal   Collection Time: 09/13/14 12:20 PM  Result Value Ref Range   Color, Urine YELLOW YELLOW   APPearance CLEAR CLEAR   Specific Gravity, Urine 1.025 1.005 - 1.030   pH 5.5 5.0 -  8.0   Glucose, UA 250 (A) NEGATIVE mg/dL   Hgb urine dipstick NEGATIVE NEGATIVE   Bilirubin Urine NEGATIVE NEGATIVE   Ketones, ur 15 (A) NEGATIVE mg/dL   Protein, ur NEGATIVE NEGATIVE mg/dL   Urobilinogen, UA 0.2 0.0 - 1.0 mg/dL   Nitrite NEGATIVE NEGATIVE   Leukocytes, UA NEGATIVE NEGATIVE  Protein / creatinine ratio, urine     Status: None   Collection Time: 09/13/14 12:20 PM  Result Value Ref Range   Creatinine, Urine 198.00 mg/dL   Total Protein, Urine 17 mg/dL   Protein Creatinine Ratio 0.09 0.00 - 0.15 mg/mg[Cre]  CBC     Status: Abnormal   Collection Time: 09/13/14  1:30 PM  Result Value Ref Range   WBC 11.0 (H) 4.0 - 10.5 K/uL   RBC 3.83 (L) 3.87 - 5.11 MIL/uL   Hemoglobin 11.5 (L) 12.0 - 15.0 g/dL   HCT 34.1 (L) 36.0 - 46.0 %   MCV 89.0 78.0 - 100.0 fL   MCH 30.0 26.0 - 34.0 pg   MCHC 33.7 30.0 - 36.0 g/dL   RDW 13.5 11.5 - 15.5 %   Platelets 192 150 - 400 K/uL  Comprehensive metabolic panel     Status: Abnormal   Collection Time: 09/13/14  1:30 PM  Result Value Ref Range   Sodium 136 135 - 145 mmol/L   Potassium 4.0 3.5 - 5.1 mmol/L   Chloride 105 101 - 111 mmol/L   CO2 21 (L) 22 - 32 mmol/L   Glucose, Bld 110 (H) 65 - 99 mg/dL   BUN 8 6 - 20 mg/dL   Creatinine, Ser 0.49 0.44 - 1.00 mg/dL   Calcium 9.0 8.9 - 10.3 mg/dL   Total Protein 6.4 (L) 6.5 - 8.1 g/dL   Albumin 3.1 (L) 3.5 - 5.0 g/dL   AST 23 15 - 41 U/L   ALT 18 14 - 54 U/L   Alkaline Phosphatase 77 38 - 126 U/L   Total Bilirubin 0.8 0.3 - 1.2 mg/dL   GFR calc non Af Amer >60 >60 mL/min   GFR calc Af Amer >60 >60 mL/min   Anion gap 10 5 - 15  Lactate dehydrogenase     Status: None   Collection Time: 09/13/14  1:30 PM  Result Value Ref Range   LDH 115 98 - 192 U/L  Uric acid     Status: None   Collection Time: 09/13/14  1:30 PM  Result Value Ref Range   Uric Acid, Serum 3.1 2.3 - 6.6 mg/dL  Fetal fibronectin     Status: None   Collection Time: 09/13/14  2:07  PM  Result Value Ref Range   Fetal  Fibronectin NEGATIVE NEGATIVE  Group B strep by PCR     Status: None   Collection Time: 09/13/14  2:07 PM  Result Value Ref Range   Group B strep by PCR NEGATIVE NEGATIVE  Wet prep, genital     Status: Abnormal   Collection Time: 09/13/14  2:07 PM  Result Value Ref Range   Yeast Wet Prep HPF POC NONE SEEN NONE SEEN   Trich, Wet Prep NONE SEEN NONE SEEN   Clue Cells Wet Prep HPF POC NONE SEEN NONE SEEN   WBC, Wet Prep HPF POC FEW (A) NONE SEEN    ED Course  Assessment: IUP at 30 1/7 weeks No evidence PTL Dizzy spell Cramping Gestational diabetes, on Glyburide Previous C/S x 2, plans repeat  Plan: Home to push po fluids and take glyburide as instructed. Rx Zyrec 10 mg po daily, Rx Zpak Keep scheduled appt at [facility] on Tuesday.   Donnel Saxon CNM, MSN 09/13/2014 4:19 PM

## 2014-09-13 NOTE — Discharge Instructions (Signed)

## 2014-09-13 NOTE — MAU Note (Addendum)
Patient states she checked her glucose level at 0900 when she felt nauseous and vomited; 215mg /dl; states she ate breakfast 2 hours before (oatmeal, eggs, ham, water and 1% milk). States her fasting was 136mg /dl this morning.

## 2014-09-13 NOTE — MAU Note (Signed)
Pt presents to MAU with complaints of dizziness, vomiting and lower abdominal cramping that started this morning at work. States she has a sinus headache and that started a week ago. Denies any vaginal bleeding or abnormal discharge

## 2014-10-01 ENCOUNTER — Encounter: Payer: Medicaid Other | Attending: Obstetrics and Gynecology | Admitting: *Deleted

## 2014-10-01 ENCOUNTER — Encounter: Payer: Self-pay | Admitting: *Deleted

## 2014-10-01 DIAGNOSIS — Z713 Dietary counseling and surveillance: Secondary | ICD-10-CM | POA: Insufficient documentation

## 2014-10-01 DIAGNOSIS — O24419 Gestational diabetes mellitus in pregnancy, unspecified control: Secondary | ICD-10-CM | POA: Insufficient documentation

## 2014-10-01 DIAGNOSIS — O24414 Gestational diabetes mellitus in pregnancy, insulin controlled: Secondary | ICD-10-CM

## 2014-10-01 NOTE — Progress Notes (Signed)
Insulin Instruction  Patient was seen on 10/01/14 for insulin instruction.  The following learning objectives were met by the patient during this visit:   Insulin Action of NPH insulin  Reviewed syringe & vial # units per syringe,    length of needles,  Hygiene and storage  Drawing up single dose using vials   Rotation of Sites   Patient demonstrated understanding of insulin administration by return demonstration.                                     Patient to start on NPH insulin 20 units this evening as she has already taken her AM glyburide.

## 2014-10-21 ENCOUNTER — Other Ambulatory Visit: Payer: Self-pay | Admitting: Obstetrics and Gynecology

## 2014-11-04 ENCOUNTER — Other Ambulatory Visit: Payer: Self-pay | Admitting: Obstetrics and Gynecology

## 2014-11-04 ENCOUNTER — Encounter (HOSPITAL_COMMUNITY): Payer: Self-pay | Admitting: Obstetrics and Gynecology

## 2014-11-04 DIAGNOSIS — B951 Streptococcus, group B, as the cause of diseases classified elsewhere: Secondary | ICD-10-CM | POA: Diagnosis present

## 2014-11-04 DIAGNOSIS — O9989 Other specified diseases and conditions complicating pregnancy, childbirth and the puerperium: Secondary | ICD-10-CM

## 2014-11-04 DIAGNOSIS — Z8659 Personal history of other mental and behavioral disorders: Secondary | ICD-10-CM

## 2014-11-04 DIAGNOSIS — I10 Essential (primary) hypertension: Secondary | ICD-10-CM

## 2014-11-04 DIAGNOSIS — Z302 Encounter for sterilization: Secondary | ICD-10-CM | POA: Diagnosis present

## 2014-11-04 DIAGNOSIS — Z6841 Body Mass Index (BMI) 40.0 and over, adult: Secondary | ICD-10-CM

## 2014-11-04 NOTE — H&P (Signed)
Robin Martin is a 34 y.o. female, [id number] at 41 6/7 weeks, scheduled for scheduled repeat C/S with BTL on 11/13/14.  Denies leaking or bleeding, reports +FM.  Denies HA, visual sx, or epigastric pain.  Seen in office today for routine OB visit, with BP 134/90 and 160/100, 4+ edema, 1+ glycosuria, BPP 8/8, AFI 22.78, 90%ile, vtx.  Sent to MAU for PIH w/u, FHR monitoring, and CBG monitoring.  Last ate at 9am.  Took 34u N and 14u R around 8am.  Most recent insulin regimen:  34u N and 14u R in the morning, 28u NPH and 12u R at night. At today's ROB visit, patient was instructed to increase insulin: 36u NPH in am 32u NPH qhs 18u Regular with breakfast and dinner.  Patient Active Problem List   Diagnosis Date Noted  . BMI 50.0-59.9, adult ([place]) 11/04/2014  . Positive GBS test 11/04/2014  . H/O postpartum depression, currently pregnant 11/04/2014  . Request for sterilization--consent signed 08/26/14 11/04/2014  . Hypertension--sporadic, related to weight 11/04/2014  . Previous cesarean section x 2 09/13/2014  . Bipolar 1 disorder ([place]) 09/13/2014  . Latex allergy 09/13/2014  .  Hx LGA (large for gestational age) fetus x 2 (9+) 09/13/2014  . Allergy to morphine 09/13/2014  . Gestational diabetes--on insulin 09/13/2014  . 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 Hx of postpartum hemorrhage, currently pregnant--with 1st pregnancy, patient reports cardiac arrest 09/13/2014  . Chronic back pain 09/13/2014  . H/O LEEP--2006 09/13/2014  . Rh negative, maternal 04/06/2012    History of present pregnancy: Patient entered care at 9 4/7 weeks.   EDC of 11/21/14 was established by LMP and was in agreement with [place] at 9 4/7 weeks.   Anatomy scan:  19 4/7 weeks, with limited anatomy, EFW 23%ile, normla fluid, 2 VC, and an posterior fundal placenta.   Additional [place] evaluations:   23 4/7 weeks:  EFW 36.2%ile, normal fluid, cervix 5.01, still limited anatomy of spine and heart due to body habitus. 27 4/7 weeks:   EFW 2+10, 61%ile, normal fluid, fetal spinal anatomy completed, cerebellum and all heart views not seen, cerivx 3.92. 32 4/7 weeks:  EFW 4+12, 61.2%ile, AFI 17.75, 65%ile, cardiac anatomy still poor views.   33 5/7 weeks--BPP WNL, vtx, AFI 18.4, 70%ile 35 weeks:  EFW 7 lbs, 97%ile, AFI 15.3, 55%ile, vtx, BPP 8/8, vtx. 35 6/7 weeks: BPP 8/8, AFI 14, 30%ile, vtx 37 weeks:  BPP 8/8, AFI 9.37, 20%ile, vtx. 36 6/7 weeks:  EFW 8+5, > 97%ile, AFI 22.62, 85%ile, vtx, BPP 8/8   Significant prenatal events:  Significant N/V in early pregnancy, with reflux.  Planned repeat C/S since early pregnancy, with BTL consent signed 08/26/14.  [place] noted on 19 4/7 week [place].  Declined MFM referral.  Fetal growth followed during pregnancy, with LGA noted on [place] at 36 6/7 weeks.  Failed early glucola, then had all abnormal values on 3 hour GTT at 24 weeks.  Started on Glyburide at 28 weeks, with increase to 10 mg BID at 30 4/7 weeks, then to insulin at 32 4/7 weeks.  Most current insulin regimen = 34 u N + 14 u R in am, 28 u NPH + 12 u R at hight.  Reported bilateral hearing loss x 2 months at 35 4/7 weeks--declined ENT referral.  Had HSV lesion on labia at 30 4/7 weeks, treated with Valtrex course then, then restarted Valtrex at 36 weeks.  Received Rhophylac at 30 4/7 weeks.  Declined flu vaccine.  More edema starting at 35 weeks, BPs 110-130/70s-80s.  Received TDAP 10/23/14.  Measured S<D at 10/30/14 visit, BP 122/78, 1+ edema.  GBS positive at 36 6/7 weeks. Last evaluation:  11/06/14--BP elevated today, 134/90 and 160/100.  BPP 8/8, vtx, AFI 22.78, 90%ile.  4+ edema, 1+ glycosuria.  FBS values were 116-142, 2 hour pp 124-209.  Dr. Charlesetta Garibaldi recommended increasing insulin regimen to 36u NPH in am, 32u NPH qhs, and 18u Regular with breakfast and dinner.  Weight 316.   OB History    Gravida Para Term Preterm AB TAB SAB Ectopic Multiple Living   5 3 2 1 1  0 1 0 0 3    2002--SVB, 34 weeks, induced due to hypertension/swelling, female,  4th degree/episiotomy, PPH, with patient reporting maternal cardiac arrest due to pph, delivered at [facility], no records available. 2005--Primary LTCS, 39 weeks, ? Due to LGA, spinal, female, delivered at [facility], 9+4 2014--Repeat LTCS, 39 weeks, female, 9+15, spinal, delivered with FP at [facility] 11/2013--SAB at 6 weeks  Past Medical History  Diagnosis Date  . Hypertension   . Herpes   . Pregnancy induced hypertension   . Abnormal Pap smear   . Depression     post-partum depression  . Bipolar 1 disorder ([place])   . Heartburn in pregnancy   . GERD (gastroesophageal reflux disease)     h/o GERD- wt loss alleviated it  . Infection     urinary tract infection  . Chronic kidney disease     kidney stone at age 53  . Ovarian cyst     age 75  . Cancer ([place]) 2005    LEEP-cervical cancer  . Gestational diabetes mellitus, antepartum    Past Surgical History  Procedure Laterality Date  . Leep    . Cesarean section with bilateral tubal ligation Bilateral 04/09/2012    BTL not done  . Cesarean section      x2--2005 and 2014   Family History: Father MI, HTN, DM; Mother DM, HTN, kidney infection, bipolar; Sister DM, HTN, asthma, kidney infection, depression; MGM Alzheimer's dx, CVA, dementia  Social History:  reports that she quit smoking about 2 years ago. Her smoking use included Cigarettes. She smoked 0.50 packs per day. She has never used smokeless tobacco. She reports that she does not drink alcohol or use illicit drugs.  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   Prenatal Transfer Tool  Maternal Diabetes: Yes:  Diabetes Type:  Insulin/Medication controlled, GDM, dx at 24 weeks Genetic Screening: Normal Quad screen Maternal Ultrasounds/Referrals: Single umbilical artery, LGA fetus, with EFW > 97%ile at 37 weeks, 8+5, polyhdramnios Fetal Ultrasounds or other Referrals:  None Maternal  Substance Abuse:  No Significant Maternal Medications:  Meds include: Other: Insulin Significant Maternal Lab Results: Lab values include: Group B Strep positive, Rh negative  TDAP 10/23/14 Flu Declined  ROS:  +FM, occasional UCs, pedal edema, slight HA  Allergies  Allergen Reactions  . Chocolate Other (See Comments)    Thins blood  . Latex Rash  . Morphine And Related Itching, Nausea And Vomiting and Rash      Last menstrual period 02/14/2014.   Weight today 316 Chest clear Heart RRR without murmur Abd gravid, NT, FH 42 cm, pannus noted. Pelvic: Cervix posterior, closed, long, vtx, -3. Ext: 2+ edema, DTR 1+, no clonus  FHR: Category 1 UCs:  Occasional, mild  Prenatal labs: ABO,  Rh:  O neg Antibody:  Neg Rubella:   Immune RPR:   NR HBsAg:   Neg HIV:   NR GBS:  Negative 09/13/14 Sickle cell/Hgb electrophoresis:  NA Pap:  04/22/14 WNL GC:  Negative 04/07/14 Chlamydia:  Positive 10/30/14 Genetic screenings:  Normal Quad screen Glucola:  Abnormal early glucola and all values abnormal on 3 hour GTT at 24 weeks. Other:   Hgb 13.5 at NOB, 11.9 at 28 weeks Urine culture >100K Ecoli at NOB, negative on repeat 06/17/14.   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 NPO  Per consult with Dr. Charlesetta Garibaldi, plan C/S today. Reviewed R&B of C/S and BTL, including bleeding, infection, damage to other organs, and failure of sterilization method.  Patient seems to understand these issues and is agreeable with proceeding with  C/S and BTL. Routine [facility] pre-op orders. Anticipate SW consult due to hx bipolar dx, no meds, and hx of pp depression.  Geovani Tootle, [place], [place] 11/06/2014, 1:28 PM

## 2014-11-06 ENCOUNTER — Inpatient Hospital Stay (HOSPITAL_COMMUNITY): Payer: Medicaid Other | Admitting: Anesthesiology

## 2014-11-06 ENCOUNTER — Encounter (HOSPITAL_COMMUNITY): Payer: Self-pay | Admitting: *Deleted

## 2014-11-06 ENCOUNTER — Inpatient Hospital Stay (HOSPITAL_COMMUNITY)
Admission: AD | Admit: 2014-11-06 | Discharge: 2014-11-09 | DRG: 765 | Disposition: A | Discharge status: Home / Self Care | Payer: Medicaid Other | Source: Ambulatory Visit | Attending: Obstetrics and Gynecology | Admitting: Obstetrics and Gynecology

## 2014-11-06 ENCOUNTER — Other Ambulatory Visit: Payer: Self-pay | Admitting: Obstetrics and Gynecology

## 2014-11-06 ENCOUNTER — Inpatient Hospital Stay (HOSPITAL_COMMUNITY)
Admission: RE | Admit: 2014-11-06 | Payer: Medicaid Other | Source: Ambulatory Visit | Admitting: Obstetrics and Gynecology

## 2014-11-06 ENCOUNTER — Encounter (HOSPITAL_COMMUNITY): Payer: Self-pay

## 2014-11-06 ENCOUNTER — Encounter (HOSPITAL_COMMUNITY)
Admission: AD | Disposition: A | Discharge status: Home / Self Care | Payer: Self-pay | Source: Ambulatory Visit | Attending: Obstetrics and Gynecology

## 2014-11-06 ENCOUNTER — Encounter (HOSPITAL_COMMUNITY): Payer: Self-pay | Admitting: Obstetrics and Gynecology

## 2014-11-06 DIAGNOSIS — Z6841 Body Mass Index (BMI) 40.0 and over, adult: Secondary | ICD-10-CM | POA: Diagnosis not present

## 2014-11-06 DIAGNOSIS — O99214 Obesity complicating childbirth: Secondary | ICD-10-CM | POA: Diagnosis present

## 2014-11-06 DIAGNOSIS — O9962 Diseases of the digestive system complicating childbirth: Secondary | ICD-10-CM | POA: Diagnosis present

## 2014-11-06 DIAGNOSIS — Z3A37 37 weeks gestation of pregnancy: Secondary | ICD-10-CM

## 2014-11-06 DIAGNOSIS — Z833 Family history of diabetes mellitus: Secondary | ICD-10-CM

## 2014-11-06 DIAGNOSIS — O9081 Anemia of the puerperium: Secondary | ICD-10-CM | POA: Diagnosis not present

## 2014-11-06 DIAGNOSIS — Z91040 Latex allergy status: Secondary | ICD-10-CM | POA: Diagnosis not present

## 2014-11-06 DIAGNOSIS — A6004 Herpesviral vulvovaginitis: Secondary | ICD-10-CM | POA: Diagnosis present

## 2014-11-06 DIAGNOSIS — O3663X0 Maternal care for excessive fetal growth, third trimester, not applicable or unspecified: Secondary | ICD-10-CM | POA: Diagnosis present

## 2014-11-06 DIAGNOSIS — Z302 Encounter for sterilization: Secondary | ICD-10-CM | POA: Diagnosis not present

## 2014-11-06 DIAGNOSIS — Z87891 Personal history of nicotine dependence: Secondary | ICD-10-CM | POA: Diagnosis not present

## 2014-11-06 DIAGNOSIS — O134 Gestational [pregnancy-induced] hypertension without significant proteinuria, complicating childbirth: Secondary | ICD-10-CM | POA: Diagnosis present

## 2014-11-06 DIAGNOSIS — O9832 Other infections with a predominantly sexual mode of transmission complicating childbirth: Secondary | ICD-10-CM | POA: Diagnosis not present

## 2014-11-06 DIAGNOSIS — Z820 Family history of epilepsy and other diseases of the nervous system: Secondary | ICD-10-CM | POA: Diagnosis not present

## 2014-11-06 DIAGNOSIS — O34211 Maternal care for low transverse scar from previous cesarean delivery: Secondary | ICD-10-CM | POA: Diagnosis not present

## 2014-11-06 DIAGNOSIS — N189 Chronic kidney disease, unspecified: Secondary | ICD-10-CM | POA: Diagnosis present

## 2014-11-06 DIAGNOSIS — L918 Other hypertrophic disorders of the skin: Secondary | ICD-10-CM | POA: Diagnosis present

## 2014-11-06 DIAGNOSIS — Z98891 History of uterine scar from previous surgery: Secondary | ICD-10-CM

## 2014-11-06 DIAGNOSIS — O403XX0 Polyhydramnios, third trimester, not applicable or unspecified: Secondary | ICD-10-CM | POA: Diagnosis present

## 2014-11-06 DIAGNOSIS — Z8249 Family history of ischemic heart disease and other diseases of the circulatory system: Secondary | ICD-10-CM

## 2014-11-06 DIAGNOSIS — O26833 Pregnancy related renal disease, third trimester: Secondary | ICD-10-CM | POA: Diagnosis present

## 2014-11-06 DIAGNOSIS — Z823 Family history of stroke: Secondary | ICD-10-CM | POA: Diagnosis not present

## 2014-11-06 DIAGNOSIS — O24424 Gestational diabetes mellitus in childbirth, insulin controlled: Secondary | ICD-10-CM | POA: Diagnosis present

## 2014-11-06 DIAGNOSIS — Z885 Allergy status to narcotic agent status: Secondary | ICD-10-CM

## 2014-11-06 DIAGNOSIS — Z6791 Unspecified blood type, Rh negative: Secondary | ICD-10-CM | POA: Diagnosis not present

## 2014-11-06 DIAGNOSIS — D649 Anemia, unspecified: Secondary | ICD-10-CM | POA: Diagnosis not present

## 2014-11-06 DIAGNOSIS — O99824 Streptococcus B carrier state complicating childbirth: Secondary | ICD-10-CM | POA: Diagnosis present

## 2014-11-06 DIAGNOSIS — O26893 Other specified pregnancy related conditions, third trimester: Secondary | ICD-10-CM | POA: Diagnosis present

## 2014-11-06 DIAGNOSIS — K219 Gastro-esophageal reflux disease without esophagitis: Secondary | ICD-10-CM | POA: Diagnosis present

## 2014-11-06 DIAGNOSIS — O34219 Maternal care for unspecified type scar from previous cesarean delivery: Secondary | ICD-10-CM | POA: Diagnosis present

## 2014-11-06 LAB — GLUCOSE, CAPILLARY
Glucose-Capillary: 74 mg/dL (ref 65–99)
Glucose-Capillary: 89 mg/dL (ref 65–99)
Glucose-Capillary: 89 mg/dL (ref 65–99)
Glucose-Capillary: 98 mg/dL (ref 65–99)

## 2014-11-06 LAB — COMPREHENSIVE METABOLIC PANEL
ALT: 11 U/L — ABNORMAL LOW (ref 14–54)
AST: 16 U/L (ref 15–41)
Albumin: 3.1 g/dL — ABNORMAL LOW (ref 3.5–5.0)
Alkaline Phosphatase: 156 U/L — ABNORMAL HIGH (ref 38–126)
Anion gap: 6 (ref 5–15)
BUN: 8 mg/dL (ref 6–20)
CO2: 23 mmol/L (ref 22–32)
Calcium: 9 mg/dL (ref 8.9–10.3)
Chloride: 106 mmol/L (ref 101–111)
Creatinine, Ser: 0.52 mg/dL (ref 0.44–1.00)
GFR calc Af Amer: >60 mL/min (ref >60)
GFR calc non Af Amer: >60 mL/min (ref >60)
Glucose, Bld: 104 mg/dL — ABNORMAL HIGH (ref 65–99)
Potassium: 3.9 mmol/L (ref 3.5–5.1)
Sodium: 135 mmol/L (ref 135–145)
Total Bilirubin: 0.7 mg/dL (ref 0.3–1.2)
Total Protein: 7 g/dL (ref 6.5–8.1)

## 2014-11-06 LAB — CBC
HCT: 33.1 % — ABNORMAL LOW (ref 36.0–46.0)
Hemoglobin: 10.8 g/dL — ABNORMAL LOW (ref 12.0–15.0)
MCH: 27.7 pg (ref 26.0–34.0)
MCHC: 32.6 g/dL (ref 30.0–36.0)
MCV: 84.9 fL (ref 78.0–100.0)
Platelets: 167 10*3/uL (ref 150–400)
RBC: 3.9 MIL/uL (ref 3.87–5.11)
RDW: 13.3 % (ref 11.5–15.5)
WBC: 10 10*3/uL (ref 4.0–10.5)

## 2014-11-06 LAB — PREPARE RBC (CROSSMATCH)

## 2014-11-06 LAB — PROTEIN / CREATININE RATIO, URINE
Creatinine, Urine: 200 mg/dL
Protein Creatinine Ratio: 0.16 mg/mg{Cre} — ABNORMAL HIGH (ref 0.00–0.15)
Total Protein, Urine: 31 mg/dL

## 2014-11-06 LAB — LACTATE DEHYDROGENASE: LDH: 108 U/L (ref 98–192)

## 2014-11-06 LAB — URIC ACID: Uric Acid, Serum: 3.5 mg/dL (ref 2.3–6.6)

## 2014-11-06 SURGERY — Surgical Case
Anesthesia: Spinal | Laterality: Bilateral

## 2014-11-06 MED ORDER — FERROUS SULFATE 325 (65 FE) MG PO TABS
325.0000 mg | ORAL_TABLET | Freq: Two times a day (BID) | ORAL | Status: DC
  Administered 2014-11-07 – 2014-11-09 (×5): 325 mg via ORAL
  Filled 2014-11-06 (×5): qty 1

## 2014-11-06 MED ORDER — SODIUM CHLORIDE 0.9 % IV SOLN: Freq: Once | INTRAVENOUS | Status: DC

## 2014-11-06 MED ORDER — SIMETHICONE 80 MG PO CHEW: 80.0000 mg | CHEWABLE_TABLET | ORAL | Status: DC | PRN

## 2014-11-06 MED ORDER — PHENYLEPHRINE 8 MG IN D5W 100 ML (0.08MG/ML) PREMIX OPTIME
INJECTION | INTRAVENOUS | Status: DC | PRN
  Administered 2014-11-06: 80 ug/min via INTRAVENOUS
  Administered 2014-11-06: 60 ug/min via INTRAVENOUS

## 2014-11-06 MED ORDER — PHENYLEPHRINE 8 MG IN D5W 100 ML (0.08MG/ML) PREMIX OPTIME
INJECTION | INTRAVENOUS | Status: AC
  Filled 2014-11-06: qty 100

## 2014-11-06 MED ORDER — FAMOTIDINE IN NACL 20-0.9 MG/50ML-% IV SOLN
20.0000 mg | Freq: Once | INTRAVENOUS | Status: AC
  Administered 2014-11-06: 20 mg via INTRAVENOUS
  Filled 2014-11-06: qty 50

## 2014-11-06 MED ORDER — MEPERIDINE HCL 25 MG/ML IJ SOLN
6.2500 mg | INTRAMUSCULAR | Status: DC | PRN
Start: 2014-11-06 — End: 2014-11-06

## 2014-11-06 MED ORDER — NALOXONE HCL 1 MG/ML IJ SOLN: 1.0000 ug/kg/h | INTRAMUSCULAR | Status: DC | PRN

## 2014-11-06 MED ORDER — SODIUM CHLORIDE 0.9 % IJ SOLN: 3.0000 mL | INTRAMUSCULAR | Status: DC | PRN

## 2014-11-06 MED ORDER — DEXTROSE-NACL 5-0.45 % IV SOLN: INTRAVENOUS | Status: DC

## 2014-11-06 MED ORDER — MENTHOL 3 MG MT LOZG: 1.0000 | LOZENGE | OROMUCOSAL | Status: DC | PRN

## 2014-11-06 MED ORDER — ACETAMINOPHEN 500 MG PO TABS: 1000.0000 mg | ORAL_TABLET | Freq: Four times a day (QID) | ORAL | Status: AC

## 2014-11-06 MED ORDER — DIPHENHYDRAMINE HCL 25 MG PO CAPS: 25.0000 mg | ORAL_CAPSULE | ORAL | Status: DC | PRN

## 2014-11-06 MED ORDER — ERYTHROMYCIN 5 MG/GM OP OINT
TOPICAL_OINTMENT | OPHTHALMIC | Status: AC
  Filled 2014-11-06: qty 1

## 2014-11-06 MED ORDER — MEASLES, MUMPS & RUBELLA VAC ~~LOC~~ INJ: 0.5000 mL | INJECTION | Freq: Once | SUBCUTANEOUS | Status: DC

## 2014-11-06 MED ORDER — SCOPOLAMINE 1 MG/3DAYS TD PT72
MEDICATED_PATCH | TRANSDERMAL | Status: DC | PRN
  Administered 2014-11-06: 1 via TRANSDERMAL

## 2014-11-06 MED ORDER — EPHEDRINE 5 MG/ML INJ
INTRAVENOUS | Status: AC
  Filled 2014-11-06: qty 10

## 2014-11-06 MED ORDER — SCOPOLAMINE 1 MG/3DAYS TD PT72
MEDICATED_PATCH | TRANSDERMAL | Status: AC
  Filled 2014-11-06: qty 1

## 2014-11-06 MED ORDER — CITRIC ACID-SODIUM CITRATE 334-500 MG/5ML PO SOLN
30.0000 mL | Freq: Once | ORAL | Status: AC
  Administered 2014-11-06: 30 mL via ORAL
  Filled 2014-11-06: qty 15

## 2014-11-06 MED ORDER — INSULIN REGULAR BOLUS VIA INFUSION
0.0000 [IU] | Freq: Three times a day (TID) | INTRAVENOUS | Status: DC
  Filled 2014-11-06: qty 10

## 2014-11-06 MED ORDER — LACTATED RINGERS IV SOLN
INTRAVENOUS | Status: DC | PRN
  Administered 2014-11-06 (×2): via INTRAVENOUS

## 2014-11-06 MED ORDER — ONDANSETRON HCL 4 MG/2ML IJ SOLN: 4.0000 mg | Freq: Three times a day (TID) | INTRAMUSCULAR | Status: DC | PRN

## 2014-11-06 MED ORDER — SENNOSIDES-DOCUSATE SODIUM 8.6-50 MG PO TABS
2.0000 | ORAL_TABLET | ORAL | Status: DC
Start: 2014-11-07 — End: 2014-11-09
  Administered 2014-11-07 – 2014-11-08 (×3): 2 via ORAL
  Filled 2014-11-06 (×3): qty 2

## 2014-11-06 MED ORDER — EPHEDRINE SULFATE 50 MG/ML IJ SOLN
INTRAMUSCULAR | Status: DC | PRN
  Administered 2014-11-06: 20 mg via INTRAVENOUS

## 2014-11-06 MED ORDER — DIPHENHYDRAMINE HCL 50 MG/ML IJ SOLN: 12.5000 mg | INTRAMUSCULAR | Status: DC | PRN

## 2014-11-06 MED ORDER — FENTANYL CITRATE (PF) 100 MCG/2ML IJ SOLN
25.0000 ug | INTRAMUSCULAR | Status: DC | PRN
  Administered 2014-11-06: 50 ug via INTRAVENOUS

## 2014-11-06 MED ORDER — FENTANYL CITRATE (PF) 100 MCG/2ML IJ SOLN
INTRAMUSCULAR | Status: AC
  Filled 2014-11-06: qty 4

## 2014-11-06 MED ORDER — SIMETHICONE 80 MG PO CHEW
80.0000 mg | CHEWABLE_TABLET | ORAL | Status: DC
  Administered 2014-11-07 – 2014-11-08 (×3): 80 mg via ORAL
  Filled 2014-11-06 (×3): qty 1

## 2014-11-06 MED ORDER — SODIUM CHLORIDE 0.9 % IV SOLN
INTRAVENOUS | Status: DC
  Administered 2014-11-06: 0.1 [IU]/h via INTRAVENOUS
  Filled 2014-11-06: qty 2.5

## 2014-11-06 MED ORDER — VALACYCLOVIR HCL 500 MG PO TABS
500.0000 mg | ORAL_TABLET | Freq: Every day | ORAL | Status: DC
  Administered 2014-11-07 – 2014-11-09 (×3): 500 mg via ORAL
  Filled 2014-11-06 (×3): qty 1

## 2014-11-06 MED ORDER — SCOPOLAMINE 1 MG/3DAYS TD PT72: 1.0000 | MEDICATED_PATCH | Freq: Once | TRANSDERMAL | Status: DC

## 2014-11-06 MED ORDER — LACTATED RINGERS IV SOLN
INTRAVENOUS | Status: DC
  Administered 2014-11-06: 125 mL/h via INTRAVENOUS

## 2014-11-06 MED ORDER — LACTATED RINGERS IV SOLN
INTRAVENOUS | Status: DC
  Administered 2014-11-07: 02:00:00 via INTRAVENOUS

## 2014-11-06 MED ORDER — PROMETHAZINE HCL 25 MG/ML IJ SOLN: 6.2500 mg | INTRAMUSCULAR | Status: DC | PRN

## 2014-11-06 MED ORDER — MEPERIDINE HCL 25 MG/ML IJ SOLN: 6.2500 mg | INTRAMUSCULAR | Status: DC | PRN

## 2014-11-06 MED ORDER — TETANUS-DIPHTH-ACELL PERTUSSIS 5-2.5-18.5 LF-MCG/0.5 IM SUSP: 0.5000 mL | Freq: Once | INTRAMUSCULAR | Status: DC

## 2014-11-06 MED ORDER — FLEET ENEMA 7-19 GM/118ML RE ENEM: 1.0000 | ENEMA | Freq: Every day | RECTAL | Status: DC | PRN

## 2014-11-06 MED ORDER — OXYTOCIN 10 UNIT/ML IJ SOLN
40.0000 [IU] | INTRAMUSCULAR | Status: DC | PRN
Start: 2014-11-06 — End: 2014-11-06
  Administered 2014-11-06: 40 [IU] via INTRAVENOUS

## 2014-11-06 MED ORDER — DEXTROSE 50 % IV SOLN: 25.0000 mL | INTRAVENOUS | Status: DC | PRN

## 2014-11-06 MED ORDER — BUPIVACAINE IN DEXTROSE 0.75-8.25 % IT SOLN
INTRATHECAL | Status: DC | PRN
  Administered 2014-11-06: 1.7 mL via INTRATHECAL

## 2014-11-06 MED ORDER — FENTANYL CITRATE (PF) 100 MCG/2ML IJ SOLN
INTRAMUSCULAR | Status: AC
  Filled 2014-11-06: qty 2

## 2014-11-06 MED ORDER — OXYTOCIN 40 UNITS IN LACTATED RINGERS INFUSION - SIMPLE MED: 62.5000 mL/h | INTRAVENOUS | Status: AC

## 2014-11-06 MED ORDER — LANOLIN HYDROUS EX OINT: 1.0000 "application " | TOPICAL_OINTMENT | CUTANEOUS | Status: DC | PRN

## 2014-11-06 MED ORDER — DIBUCAINE 1 % RE OINT
1.0000 | TOPICAL_OINTMENT | RECTAL | Status: DC | PRN
Start: 2014-11-06 — End: 2014-11-09

## 2014-11-06 MED ORDER — ZOLPIDEM TARTRATE 5 MG PO TABS: 5.0000 mg | ORAL_TABLET | Freq: Every evening | ORAL | Status: DC | PRN

## 2014-11-06 MED ORDER — SODIUM CHLORIDE 0.9 % IR SOLN
Status: DC | PRN
  Administered 2014-11-06: 1

## 2014-11-06 MED ORDER — LIDOCAINE HCL (PF) 1 % IJ SOLN
INTRAMUSCULAR | Status: DC | PRN
  Administered 2014-11-06: 5 mL

## 2014-11-06 MED ORDER — BISACODYL 10 MG RE SUPP: 10.0000 mg | Freq: Every day | RECTAL | Status: DC | PRN

## 2014-11-06 MED ORDER — NALOXONE HCL 0.4 MG/ML IJ SOLN: 0.4000 mg | INTRAMUSCULAR | Status: DC | PRN

## 2014-11-06 MED ORDER — ATROPINE SULFATE 0.4 MG/ML IJ SOLN
INTRAMUSCULAR | Status: AC
  Filled 2014-11-06: qty 1

## 2014-11-06 MED ORDER — PRENATAL MULTIVITAMIN CH
1.0000 | ORAL_TABLET | Freq: Every day | ORAL | Status: DC
  Administered 2014-11-07 – 2014-11-08 (×2): 1 via ORAL
  Filled 2014-11-06 (×2): qty 1

## 2014-11-06 MED ORDER — WITCH HAZEL-GLYCERIN EX PADS
1.0000 | MEDICATED_PAD | CUTANEOUS | Status: DC | PRN
Start: 2014-11-06 — End: 2014-11-09

## 2014-11-06 MED ORDER — LIDOCAINE HCL 1 % IJ SOLN
INTRAMUSCULAR | Status: AC
  Filled 2014-11-06: qty 20

## 2014-11-06 MED ORDER — SODIUM CHLORIDE 0.9 % IV SOLN: INTRAVENOUS | Status: DC

## 2014-11-06 MED ORDER — GLYCOPYRROLATE 0.2 MG/ML IJ SOLN
INTRAMUSCULAR | Status: AC
  Filled 2014-11-06: qty 1

## 2014-11-06 MED ORDER — DIPHENHYDRAMINE HCL 25 MG PO CAPS: 25.0000 mg | ORAL_CAPSULE | Freq: Four times a day (QID) | ORAL | Status: DC | PRN

## 2014-11-06 MED ORDER — GLYCOPYRROLATE 0.2 MG/ML IJ SOLN
INTRAMUSCULAR | Status: DC | PRN
  Administered 2014-11-06: 0.2 mg via INTRAVENOUS

## 2014-11-06 MED ORDER — DEXTROSE 5 % IV SOLN
3.0000 g | Freq: Once | INTRAVENOUS | Status: AC
  Administered 2014-11-06: 3 g via INTRAVENOUS
  Filled 2014-11-06: qty 3000

## 2014-11-06 MED ORDER — ACETAMINOPHEN 325 MG PO TABS: 650.0000 mg | ORAL_TABLET | ORAL | Status: DC | PRN

## 2014-11-06 MED ORDER — SIMETHICONE 80 MG PO CHEW
80.0000 mg | CHEWABLE_TABLET | Freq: Three times a day (TID) | ORAL | Status: DC
  Administered 2014-11-07 – 2014-11-09 (×7): 80 mg via ORAL
  Filled 2014-11-06 (×6): qty 1

## 2014-11-06 MED ORDER — OXYTOCIN 10 UNIT/ML IJ SOLN
INTRAMUSCULAR | Status: AC
Start: 2014-11-06 — End: 2014-11-06
  Filled 2014-11-06: qty 4

## 2014-11-06 MED ORDER — PANTOPRAZOLE SODIUM 40 MG PO TBEC
40.0000 mg | DELAYED_RELEASE_TABLET | Freq: Every day | ORAL | Status: DC
  Administered 2014-11-07 – 2014-11-09 (×3): 40 mg via ORAL
  Filled 2014-11-06 (×3): qty 1

## 2014-11-06 MED ORDER — FENTANYL CITRATE (PF) 100 MCG/2ML IJ SOLN
INTRAMUSCULAR | Status: DC | PRN
  Administered 2014-11-06: 5 ug via INTRAVENOUS
  Administered 2014-11-06: 20 ug via INTRAVENOUS
  Administered 2014-11-06: 25 ug via INTRAVENOUS

## 2014-11-06 MED ORDER — OXYCODONE-ACETAMINOPHEN 5-325 MG PO TABS
1.0000 | ORAL_TABLET | ORAL | Status: DC | PRN
  Administered 2014-11-06: 1 via ORAL
  Administered 2014-11-07: 2 via ORAL
  Administered 2014-11-07: 1 via ORAL
  Administered 2014-11-07: 2 via ORAL
  Administered 2014-11-07: 1 via ORAL
  Administered 2014-11-08 (×3): 2 via ORAL
  Administered 2014-11-08 – 2014-11-09 (×3): 1 via ORAL
  Filled 2014-11-06: qty 2
  Filled 2014-11-06 (×2): qty 1
  Filled 2014-11-06: qty 2
  Filled 2014-11-06: qty 1
  Filled 2014-11-06: qty 2
  Filled 2014-11-06: qty 1
  Filled 2014-11-06: qty 2
  Filled 2014-11-06 (×3): qty 1
  Filled 2014-11-06: qty 2

## 2014-11-06 MED ORDER — ONDANSETRON HCL 4 MG/2ML IJ SOLN
INTRAMUSCULAR | Status: AC
  Filled 2014-11-06: qty 2

## 2014-11-06 MED ORDER — IBUPROFEN 600 MG PO TABS
600.0000 mg | ORAL_TABLET | Freq: Four times a day (QID) | ORAL | Status: DC
  Administered 2014-11-07 – 2014-11-09 (×11): 600 mg via ORAL
  Filled 2014-11-06 (×11): qty 1

## 2014-11-06 MED ORDER — ATROPINE SULFATE 0.4 MG/ML IJ SOLN
INTRAMUSCULAR | Status: DC | PRN
  Administered 2014-11-06: 0.4 mg via INTRAVENOUS

## 2014-11-06 MED ORDER — ONDANSETRON HCL 4 MG/2ML IJ SOLN
INTRAMUSCULAR | Status: DC | PRN
  Administered 2014-11-06: 4 mg via INTRAVENOUS

## 2014-11-06 SURGICAL SUPPLY — 43 items
BENZOIN TINCTURE PRP APPL 2/3 (GAUZE/BANDAGES/DRESSINGS) ×2 IMPLANT
CLAMP CORD UMBIL (MISCELLANEOUS) IMPLANT
CLOTH BEACON ORANGE TIMEOUT ST (SAFETY) ×2 IMPLANT
CONTAINER PREFILL 10% NBF 15ML (MISCELLANEOUS) IMPLANT
DRAIN JACKSON PRT FLT 10 (DRAIN) ×2 IMPLANT
DRAPE SHEET LG 3/4 BI-LAMINATE (DRAPES) IMPLANT
DRSG OPSITE POSTOP 4X10 (GAUZE/BANDAGES/DRESSINGS) ×2 IMPLANT
DRSG PAD ABDOMINAL 8X10 ST (GAUZE/BANDAGES/DRESSINGS) ×2 IMPLANT
DURAPREP 26ML APPLICATOR (WOUND CARE) ×2 IMPLANT
ELECT REM PT RETURN 9FT ADLT (ELECTROSURGICAL) ×2
ELECTRODE REM PT RTRN 9FT ADLT (ELECTROSURGICAL) ×1 IMPLANT
EVACUATOR SILICONE 100CC (DRAIN) ×2 IMPLANT
EXTRACTOR VACUUM M CUP 4 TUBE (SUCTIONS) IMPLANT
GLOVE BIO SURGEON STRL SZ 6.5 (GLOVE) ×2 IMPLANT
GLOVE BIOGEL PI IND STRL 7.0 (GLOVE) ×1 IMPLANT
GLOVE BIOGEL PI INDICATOR 7.0 (GLOVE) ×1
GOWN STRL REUS W/TWL LRG LVL3 (GOWN DISPOSABLE) ×4 IMPLANT
HOVERMATT HALF SINGLE USE (PATIENT TRANSFER) ×2 IMPLANT
KIT ABG SYR 3ML LUER SLIP (SYRINGE) IMPLANT
NEEDLE HYPO 25X5/8 SAFETYGLIDE (NEEDLE) IMPLANT
NS IRRIG 1000ML POUR BTL (IV SOLUTION) ×2 IMPLANT
PACK C SECTION WH (CUSTOM PROCEDURE TRAY) ×2 IMPLANT
PAD ABD 7.5X8 STRL (GAUZE/BANDAGES/DRESSINGS) ×2 IMPLANT
PAD OB MATERNITY 4.3X12.25 (PERSONAL CARE ITEMS) ×2 IMPLANT
PENCIL SMOKE EVAC W/HOLSTER (ELECTROSURGICAL) ×2 IMPLANT
RETRACTOR TRAXI PANNICULUS (MISCELLANEOUS) ×1 IMPLANT
RETRACTOR WOUND ALXS 34CM XLRG (MISCELLANEOUS) ×1 IMPLANT
RTRCTR C-SECT PINK 25CM LRG (MISCELLANEOUS) IMPLANT
RTRCTR WOUND ALEXIS 34CM XLRG (MISCELLANEOUS) ×2
SPONGE GAUZE 4X4 12PLY STER LF (GAUZE/BANDAGES/DRESSINGS) ×2 IMPLANT
STRIP CLOSURE SKIN 1/2X4 (GAUZE/BANDAGES/DRESSINGS) ×2 IMPLANT
SUT CHROMIC 0 CT 1 (SUTURE) ×2 IMPLANT
SUT MNCRL AB 3-0 PS2 27 (SUTURE) ×2 IMPLANT
SUT PLAIN 2 0 (SUTURE) ×3
SUT PLAIN 2 0 XLH (SUTURE) ×2 IMPLANT
SUT PLAIN ABS 2-0 CT1 27XMFL (SUTURE) ×3 IMPLANT
SUT PROLENE 2 0 CT2 30 (SUTURE) ×2 IMPLANT
SUT SILK 2 0 SH (SUTURE) ×2 IMPLANT
SUT VIC AB 0 CTX 36 (SUTURE) ×4
SUT VIC AB 0 CTX36XBRD ANBCTRL (SUTURE) ×4 IMPLANT
TOWEL OR 17X24 6PK STRL BLUE (TOWEL DISPOSABLE) ×2 IMPLANT
TRAXI PANNICULUS RETRACTOR (MISCELLANEOUS) ×1
TRAY FOLEY CATH SILVER 14FR (SET/KITS/TRAYS/PACK) ×2 IMPLANT

## 2014-11-06 NOTE — MAU Note (Signed)
Insulin drip stopped per glucose stablizer glucose 89

## 2014-11-06 NOTE — Anesthesia Procedure Notes (Signed)
Spinal Patient location during procedure: OR Start time: 11/06/2014 4:05 PM End time: 11/06/2014 4:20 PM Staffing Anesthesiologist: Alexis Frock Preanesthetic Checklist Completed: patient identified, site marked, surgical consent, pre-op evaluation, timeout performed, IV checked, risks and benefits discussed and monitors and equipment checked Spinal Block Patient position: sitting Prep: site prepped and draped and DuraPrep Patient monitoring: heart rate, continuous pulse ox and blood pressure Approach: midline Location: L3-4 Injection technique: single-shot Needle Needle type: Pencil-Tip  Needle gauge: 24 G Needle length: 10 cm Needle insertion depth: 7 cm Assessment Sensory level: T3 Additional Notes No  complications

## 2014-11-06 NOTE — Anesthesia Preprocedure Evaluation (Addendum)
Anesthesia Evaluation  Patient identified by MRN, date of birth, ID band Patient awake    Reviewed: Allergy & Precautions, NPO status , Patient's Chart, lab work & pertinent test results, reviewed documented beta blocker date and time   Airway Mallampati: II   Neck ROM: Full    Dental  (+) Teeth Intact, Dental Advisory Given   Pulmonary former smoker,    breath sounds clear to auscultation       Cardiovascular hypertension, Pt. on medications  Rhythm:Regular     Neuro/Psych Depression Bipolar Disorder    GI/Hepatic GERD  Medicated,  Endo/Other  diabetes, Gestational, Insulin DependentMorbid obesity  Renal/GU      Musculoskeletal   Abdominal (+) + obese,   Peds  Hematology 11/33  plts 167   Anesthesia Other Findings   Reproductive/Obstetrics                           Anesthesia Physical Anesthesia Plan  ASA: III and emergent  Anesthesia Plan: Spinal   Post-op Pain Management:    Induction:   Airway Management Planned: Nasal Cannula  Additional Equipment:   Intra-op Plan:   Post-operative Plan:   Informed Consent: I have reviewed the patients History and Physical, chart, labs and discussed the procedure including the risks, benefits and alternatives for the proposed anesthesia with the patient or authorized representative who has indicated his/her understanding and acceptance.     Plan Discussed with:   Anesthesia Plan Comments: (Will T&C,  HX of previous PPH, 3rd CS, Morbid obesity, HTN, DM, Allergic to Morphine, 2nd IV if any significant bleeding)        Anesthesia Quick Evaluation

## 2014-11-06 NOTE — Anesthesia Postprocedure Evaluation (Signed)
  Anesthesia Post-op Note  Patient: Robin Martin  Procedure(s) Performed: Procedure(s): REPEAT CESAREAN SECTION WITH BILATERAL TUBAL LIGATION REMOVAL OF SKIN TAG (Bilateral)  Patient Location: PACU  Anesthesia Type:Spinal  Level of Consciousness: awake, alert  and oriented  Airway and Oxygen Therapy: Patient Spontanous Breathing  Post-op Pain: mild  Post-op Assessment: Post-op Vital signs reviewed and Patient's Cardiovascular Status Stable              Post-op Vital Signs: Reviewed and stable  Last Vitals:  Filed Vitals:   11/06/14 1815  BP: 142/90  Pulse: 89  Temp:   Resp: 24    Complications: No apparent anesthesia complications

## 2014-11-06 NOTE — Progress Notes (Signed)
Robin Martin, [id number] 5 Hrs Post Op S/P Repeat C/S for H/O Previous C/S, GDM-A2, PIH  Subjective: Patient sitting up in high fowlers.  Report pain, but is tolerable.  Currently eating and denies issues with nausea/vomiting.  States JP drain is without bulb.  Objective: Filed Vitals:   11/06/14 1900 11/06/14 1915 11/06/14 1937 11/06/14 2142  BP: 144/81 127/63 126/75 132/77  Pulse: 85 82  90  Temp:   98.8 F (37.1 C) 98.9 F (37.2 C)  TempSrc:    Oral  Resp: 20 20 20 20   Weight:      SpO2: 99% 100% 98% 96%     Physical Exam: -Deferred -JP line in place, bulb noted in bed with 20cc of sanguineous fluid  Assessment 5 Hrs Post Op Hemodynamically Stable JP Drain  Plan: New bulb placed on JP line Discussed advancement of diet and movement Discussed pain management-Patient states she has taken percocet in past without issues Percocet 5/325 1-2 tablet Q 4 hrs prn ordered Nurse updated Continue current care as ordered  Rhema Boyett LYNN, MSN, CNM 11/06/2014, 10:10 PM

## 2014-11-06 NOTE — H&P (Signed)
Robin Martin is a 34 y.o. female, [id number] at 32 6/7 weeks, scheduled for scheduled repeat C/S with BTL on 11/13/14.  Denies leaking or bleeding, reports +FM.  Denies HA, visual sx, or epigastric pain.  Seen in office today for routine OB visit, with BP 134/90 and 160/100, 4+ edema, 1+ glycosuria, BPP 8/8, AFI 22.78, 90%ile, vtx.  Sent to MAU for PIH w/u, FHR monitoring, and CBG monitoring.  Last ate at 9am.  Took 34u N and 14u R around 8am.  Most recent insulin regimen:  34u N and 14u R in the morning, 28u NPH and 12u R at night. At today's ROB visit, patient was instructed to increase insulin: 36u NPH in am 32u NPH qhs 18u Regular with breakfast and dinner.  Patient Active Problem List   Diagnosis Date Noted  . Polyhydramnios in third trimester--90%ile, 22.78 11/06/14 11/06/2014  . BMI 50.0-59.9, adult ([place]) 11/04/2014  . Positive GBS test 11/04/2014  . H/O postpartum depression, currently pregnant 11/04/2014  . Request for sterilization--consent signed 08/26/14 11/04/2014  . Hypertension--sporadic, related to weight 11/04/2014  . Previous cesarean section x 2 09/13/2014  . Bipolar 1 disorder ([place]) 09/13/2014  . Latex allergy 09/13/2014  .  Hx LGA (large for gestational age) fetus x 2 (9+) 09/13/2014  . Allergy to morphine 09/13/2014  . Gestational diabetes--on insulin 09/13/2014  . 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 Hx of postpartum hemorrhage, currently pregnant--with 1st pregnancy, patient reports cardiac arrest 09/13/2014  . Chronic back pain 09/13/2014  . H/O LEEP--2006 09/13/2014  . Rh negative, maternal 04/06/2012    History of present pregnancy: Patient entered care at 9 4/7 weeks.   EDC of 11/21/14 was established by LMP and was in agreement with [place] at 9 4/7 weeks.   Anatomy scan:  19 4/7 weeks, with limited anatomy, EFW 23%ile, normla fluid, 2 VC, and an posterior fundal placenta.   Additional [place] evaluations:   23 4/7 weeks:  EFW 36.2%ile, normal fluid, cervix 5.01,  still limited anatomy of spine and heart due to body habitus. 27 4/7 weeks:  EFW 2+10, 61%ile, normal fluid, fetal spinal anatomy completed, cerebellum and all heart views not seen, cerivx 3.92. 32 4/7 weeks:  EFW 4+12, 61.2%ile, AFI 17.75, 65%ile, cardiac anatomy still poor views.   33 5/7 weeks--BPP WNL, vtx, AFI 18.4, 70%ile 35 weeks:  EFW 7 lbs, 97%ile, AFI 15.3, 55%ile, vtx, BPP 8/8, vtx. 35 6/7 weeks: BPP 8/8, AFI 14, 30%ile, vtx 37 weeks:  BPP 8/8, AFI 9.37, 20%ile, vtx. 36 6/7 weeks:  EFW 8+5, > 97%ile, AFI 22.62, 85%ile, vtx, BPP 8/8   Significant prenatal events:  Significant N/V in early pregnancy, with reflux.  Planned repeat C/S since early pregnancy, with BTL consent signed 08/26/14.  [place] noted on 19 4/7 week [place].  Declined MFM referral.  Fetal growth followed during pregnancy, with LGA noted on [place] at 36 6/7 weeks.  Failed early glucola, then had all abnormal values on 3 hour GTT at 24 weeks.  Started on Glyburide at 28 weeks, with increase to 10 mg BID at 30 4/7 weeks, then to insulin at 32 4/7 weeks.  Most current insulin regimen = 34 u N + 14 u R in am, 28 u NPH + 12 u R at hight.  Reported bilateral hearing loss x 2 months at 35 4/7 weeks--declined ENT referral.  Had HSV lesion on labia at 30 4/7 weeks, treated with Valtrex course then, then restarted Valtrex at 36 weeks.  Received  Rhophylac at 30 4/7 weeks.  Declined flu vaccine.  More edema starting at 35 weeks, BPs 110-130/70s-80s.  Received TDAP 10/23/14.  Measured S<D at 10/30/14 visit, BP 122/78, 1+ edema.  GBS positive at 36 6/7 weeks. Last evaluation:  11/06/14--BP elevated today, 134/90 and 160/100.  BPP 8/8, vtx, AFI 22.78, 90%ile.  4+ edema, 1+ glycosuria.  FBS values were 116-142, 2 hour pp 124-209.  Dr. Charlesetta Garibaldi recommended increasing insulin regimen to 36u NPH in am, 32u NPH qhs, and 18u Regular with breakfast and dinner.  Weight 316.   OB History    Gravida Para Term Preterm AB TAB SAB Ectopic Multiple Living   5 3 2 1 1  0 1  0 0 3    2002--SVB, 34 weeks, induced due to hypertension/swelling, female, 4th degree/episiotomy, PPH, with patient reporting maternal cardiac arrest due to pph, delivered at [facility], no records available. 2005--Primary LTCS, 39 weeks, ? Due to LGA, spinal, female, delivered at [facility], 9+4 2014--Repeat LTCS, 39 weeks, female, 9+15, spinal, delivered with FP at [facility] 11/2013--SAB at 6 weeks  Past Medical History  Diagnosis Date  . Hypertension   . Herpes   . Pregnancy induced hypertension   . Abnormal Pap smear   . Depression     post-partum depression  . Bipolar 1 disorder ([place])   . Heartburn in pregnancy   . GERD (gastroesophageal reflux disease)     h/o GERD- wt loss alleviated it  . Infection     urinary tract infection  . Chronic kidney disease     kidney stone at age 46  . Ovarian cyst     age 6  . Cancer ([place]) 2005    LEEP-cervical cancer  . Gestational diabetes mellitus, antepartum    Past Surgical History  Procedure Laterality Date  . Leep    . Cesarean section with bilateral tubal ligation Bilateral 04/09/2012    BTL not done  . Cesarean section      x2--2005 and 2014   Family History: Father MI, HTN, DM; Mother DM, HTN, kidney infection, bipolar; Sister DM, HTN, asthma, kidney infection, depression; MGM Alzheimer's dx, CVA, dementia  Social History:  reports that she quit smoking about 2 years ago. Her smoking use included Cigarettes. She smoked 0.50 packs per day. She has never used smokeless tobacco. She reports that she does not drink alcohol or use illicit drugs.  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   Prenatal Transfer Tool  Maternal Diabetes: Yes:  Diabetes Type:  Insulin/Medication controlled, GDM, dx at 24 weeks Genetic Screening: Normal Quad screen Maternal Ultrasounds/Referrals: Single umbilical artery, LGA fetus, with EFW > 97%ile at 37  weeks, 8+5, polyhdramnios Fetal Ultrasounds or other Referrals:  None Maternal Substance Abuse:  No Significant Maternal Medications:  Meds include: Other: Insulin Significant Maternal Lab Results: Lab values include: Group B Strep positive, Rh negative  TDAP 10/23/14 Flu Declined  ROS:  +FM, occasional UCs, pedal edema, slight HA  Allergies  Allergen Reactions  . Chocolate Other (See Comments)    Thins blood  . Latex Rash  . Morphine And Related Itching, Nausea And Vomiting and Rash     Blood pressure 157/93, pulse 102, temperature 98.3 F (36.8 C), temperature source Oral, resp. rate 148, weight 143.337 kg (316 lb), last menstrual period 02/14/2014.   Filed Vitals:   11/06/14 1257 11/06/14 1307 11/06/14 1316  BP: 166/97  145/93 157/93  Pulse: 101 114 102  Temp: 98.3 F (36.8 C)    TempSrc: Oral    Resp: 148    Weight: 143.337 kg (316 lb)      Weight today 316 Chest clear Heart RRR without murmur Abd gravid, NT, FH 42 cm, pannus noted. Pelvic: Cervix posterior, closed, long, vtx, -3. Ext: 2+ edema, DTR 1+, no clonus  FHR: Category 1 UCs:  Occasional, mild  Prenatal labs: ABO, Rh:  O neg Antibody:  Neg Rubella:   Immune RPR:   NR HBsAg:   Neg HIV:   NR GBS:  Negative 09/13/14 Sickle cell/Hgb electrophoresis:  NA Pap:  04/22/14 WNL GC:  Negative 04/07/14 Chlamydia:  Positive 10/30/14 Genetic screenings:  Normal Quad screen Glucola:  Abnormal early glucola and all values abnormal on 3 hour GTT at 24 weeks. Other:   Hgb 13.5 at NOB, 11.9 at 28 weeks Urine culture >100K Ecoli at NOB, negative on repeat 06/17/14.   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 NPO  Per consult with Dr. Charlesetta Garibaldi, plan C/S today. Reviewed R&B of C/S and BTL, including bleeding, infection, damage to other organs, and failure of sterilization method.  Patient seems to understand these issues and is agreeable with proceeding with C/S and BTL. Routine [facility] pre-op orders. Anticipate SW consult due to hx bipolar dx, no meds, and hx of pp depression.  Clorissa Gruenberg, [place], [place] 11/06/2014, 1:33 PM  Addendum: Initial CBG 89--will initiate glucose stabilizer order set.  Results for orders placed or performed during the hospital encounter of 11/06/14 (from the past 24 hour(s))  CBC     Status: Abnormal   Collection Time: 11/06/14  1:22 PM  Result Value Ref Range   WBC 10.0 4.0 - 10.5 K/uL   RBC 3.90 3.87 - 5.11 MIL/uL   Hemoglobin 10.8 (L) 12.0 - 15.0 g/dL   HCT 33.1 (L) 36.0 - 46.0 %   MCV 84.9 78.0 - 100.0 fL   MCH 27.7 26.0 - 34.0 pg   MCHC 32.6 30.0 - 36.0 g/dL   RDW 13.3 11.5 - 15.5 %   Platelets 167 150 - 400 K/uL  Glucose, capillary     Status: None   Collection Time: 11/06/14  1:36 PM  Result Value Ref Range   Glucose-Capillary 89 65 - 99 mg/dL   Scheduled for C/S with BTL at [place], CNM 11/06/14 2p

## 2014-11-06 NOTE — Transfer of Care (Signed)
Immediate Anesthesia Transfer of Care Note  Patient: Lichelle Vickie Epley  Procedure(s) Performed: Procedure(s): REPEAT CESAREAN SECTION WITH BILATERAL TUBAL LIGATION REMOVAL OF SKIN TAG (Bilateral)  Patient Location: PACU  Anesthesia Type:Spinal  Level of Consciousness: awake, alert  and oriented  Airway & Oxygen Therapy: Patient Spontanous Breathing  Post-op Assessment: Report given to RN and Post -op Vital signs reviewed and stable  Post vital signs: Reviewed and stable  Last Vitals:  Filed Vitals:   11/06/14 1431  BP: 137/63  Pulse: 95  Temp:   Resp:     Complications: No apparent anesthesia complications

## 2014-11-06 NOTE — H&P (Signed)
Robin Martin is a 34 y.o. female, [id number] at 1 6/7 weeks, scheduled for scheduled repeat C/S with BTL on 11/13/14.  Denies leaking or bleeding, reports +FM.  Denies HA, visual sx, or epigastric pain.  Seen in office today for routine OB visit, with BP 134/90 and 160/100, 4+ edema, 1+ glycosuria, BPP 8/8, AFI 22.78, 90%ile, vtx.  Sent to MAU for PIH w/u, FHR monitoring, and CBG monitoring.  Last ate at 9am.  Took 34u N and 14u R around 8am.  Most recent insulin regimen:  34u N and 14u R in the morning, 28u NPH and 12u R at night. At today's ROB visit, patient was instructed to increase insulin: 36u NPH in am 32u NPH qhs 18u Regular with breakfast and dinner.  Patient Active Problem List   Diagnosis Date Noted  . Polyhydramnios in third trimester--90%ile, 22.78 11/06/14 11/06/2014  . BMI 50.0-59.9, adult ([place]) 11/04/2014  . Positive GBS test 11/04/2014  . H/O postpartum depression, currently pregnant 11/04/2014  . Request for sterilization--consent signed 08/26/14 11/04/2014  . Hypertension--sporadic, related to weight 11/04/2014  . Previous cesarean section x 2 09/13/2014  . Bipolar 1 disorder ([place]) 09/13/2014  . Latex allergy 09/13/2014  .  Hx LGA (large for gestational age) fetus x 2 (9+) 09/13/2014  . Allergy to morphine 09/13/2014  . Gestational diabetes--on insulin 09/13/2014  . 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 Hx of postpartum hemorrhage, currently pregnant--with 1st pregnancy, patient reports cardiac arrest 09/13/2014  . Chronic back pain 09/13/2014  . H/O LEEP--2006 09/13/2014  . Rh negative, maternal 04/06/2012    History of present pregnancy: Patient entered care at 9 4/7 weeks.   EDC of 11/21/14 was established by LMP and was in agreement with [place] at 9 4/7 weeks.   Anatomy scan:  19 4/7 weeks, with limited anatomy, EFW 23%ile, normla fluid, 2 VC, and an posterior fundal placenta.   Additional [place] evaluations:   23 4/7 weeks:  EFW 36.2%ile, normal fluid, cervix 5.01,  still limited anatomy of spine and heart due to body habitus. 27 4/7 weeks:  EFW 2+10, 61%ile, normal fluid, fetal spinal anatomy completed, cerebellum and all heart views not seen, cerivx 3.92. 32 4/7 weeks:  EFW 4+12, 61.2%ile, AFI 17.75, 65%ile, cardiac anatomy still poor views.   33 5/7 weeks--BPP WNL, vtx, AFI 18.4, 70%ile 35 weeks:  EFW 7 lbs, 97%ile, AFI 15.3, 55%ile, vtx, BPP 8/8, vtx. 35 6/7 weeks: BPP 8/8, AFI 14, 30%ile, vtx 37 weeks:  BPP 8/8, AFI 9.37, 20%ile, vtx. 36 6/7 weeks:  EFW 8+5, > 97%ile, AFI 22.62, 85%ile, vtx, BPP 8/8   Significant prenatal events:  Significant N/V in early pregnancy, with reflux.  Planned repeat C/S since early pregnancy, with BTL consent signed 08/26/14.  [place] noted on 19 4/7 week [place].  Declined MFM referral.  Fetal growth followed during pregnancy, with LGA noted on [place] at 36 6/7 weeks.  Failed early glucola, then had all abnormal values on 3 hour GTT at 24 weeks.  Started on Glyburide at 28 weeks, with increase to 10 mg BID at 30 4/7 weeks, then to insulin at 32 4/7 weeks.  Most current insulin regimen = 34 u N + 14 u R in am, 28 u NPH + 12 u R at hight.  Reported bilateral hearing loss x 2 months at 35 4/7 weeks--declined ENT referral.  Had HSV lesion on labia at 30 4/7 weeks, treated with Valtrex course then, then restarted Valtrex at 36 weeks.  Received  Rhophylac at 30 4/7 weeks.  Declined flu vaccine.  More edema starting at 35 weeks, BPs 110-130/70s-80s.  Received TDAP 10/23/14.  Measured S<D at 10/30/14 visit, BP 122/78, 1+ edema.  GBS positive at 36 6/7 weeks. Last evaluation:  11/06/14--BP elevated today, 134/90 and 160/100.  BPP 8/8, vtx, AFI 22.78, 90%ile.  4+ edema, 1+ glycosuria.  FBS values were 116-142, 2 hour pp 124-209.  Dr. Charlesetta Garibaldi recommended increasing insulin regimen to 36u NPH in am, 32u NPH qhs, and 18u Regular with breakfast and dinner.  Weight 316.   OB History    Gravida Para Term Preterm AB TAB SAB Ectopic Multiple Living   5 3 2 1 1  0 1  0 0 3    2002--SVB, 34 weeks, induced due to hypertension/swelling, female, 4th degree/episiotomy, PPH, with patient reporting maternal cardiac arrest due to pph, delivered at [facility], no records available. 2005--Primary LTCS, 39 weeks, ? Due to LGA, spinal, female, delivered at [facility], 9+4 2014--Repeat LTCS, 39 weeks, female, 9+15, spinal, delivered with FP at [facility] 11/2013--SAB at 6 weeks  Past Medical History  Diagnosis Date  . Hypertension   . Herpes   . Pregnancy induced hypertension   . Abnormal Pap smear   . Depression     post-partum depression  . Bipolar 1 disorder ([place])   . Heartburn in pregnancy   . GERD (gastroesophageal reflux disease)     h/o GERD- wt loss alleviated it  . Infection     urinary tract infection  . Chronic kidney disease     kidney stone at age 37  . Ovarian cyst     age 56  . Cancer ([place]) 2005    LEEP-cervical cancer  . Gestational diabetes mellitus, antepartum    Past Surgical History  Procedure Laterality Date  . Leep    . Cesarean section with bilateral tubal ligation Bilateral 04/09/2012    BTL not done  . Cesarean section      x2--2005 and 2014   Family History: Father MI, HTN, DM; Mother DM, HTN, kidney infection, bipolar; Sister DM, HTN, asthma, kidney infection, depression; MGM Alzheimer's dx, CVA, dementia  Social History:  reports that she quit smoking about 2 years ago. Her smoking use included Cigarettes. She smoked 0.50 packs per day. She has never used smokeless tobacco. She reports that she does not drink alcohol or use illicit drugs.  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   Prenatal Transfer Tool  Maternal Diabetes: Yes:  Diabetes Type:  Insulin/Medication controlled, GDM, dx at 24 weeks Genetic Screening: Normal Quad screen Maternal Ultrasounds/Referrals: Single umbilical artery, LGA fetus, with EFW > 97%ile at 37  weeks, 8+5, polyhdramnios Fetal Ultrasounds or other Referrals:  None Maternal Substance Abuse:  No Significant Maternal Medications:  Meds include: Other: Insulin Significant Maternal Lab Results: Lab values include: Group B Strep positive, Rh negative  TDAP 10/23/14 Flu Declined  ROS:  +FM, occasional UCs, pedal edema, slight HA  Allergies  Allergen Reactions  . Chocolate Other (See Comments)    Thins blood  . Latex Rash  . Morphine And Related Itching, Nausea And Vomiting and Rash    Exam by:: Ginger Morris RN Blood pressure 157/93, pulse 102, temperature 98.3 F (36.8 C), temperature source Oral, resp. rate 148, weight 143.337 kg (316 lb), last menstrual period 02/14/2014.   Weight today 316 Chest clear Heart RRR without murmur  Abd gravid, NT, FH 42 cm, pannus noted. Pelvic: Cervix posterior, closed, long, vtx, -3. Ext: 2+ edema, DTR 1+, no clonus  FHR: Category 1 UCs:  Occasional, mild  Prenatal labs: ABO, Rh:  O neg Antibody:  Neg Rubella:   Immune RPR:   NR HBsAg:   Neg HIV:   NR GBS:  Negative 09/13/14 Sickle cell/Hgb electrophoresis:  NA Pap:  04/22/14 WNL GC:  Negative 04/07/14 Chlamydia:  Positive 10/30/14 Genetic screenings:  Normal Quad screen Glucola:  Abnormal early glucola and all values abnormal on 3 hour GTT at 24 weeks. Other:   Hgb 13.5 at NOB, 11.9 at 28 weeks Urine culture >100K Ecoli at NOB, negative on repeat 06/17/14.   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 NPO  Per  consult with Dr. Charlesetta Garibaldi, plan C/S today. Reviewed R&B of C/S and BTL, including bleeding, infection, damage to other organs, and failure of sterilization method.  Patient seems to understand these issues and is agreeable with proceeding with C/S and BTL. Routine [facility] pre-op orders. Anticipate SW consult due to hx bipolar dx, no meds, and hx of pp depression.  Olamae Ferrara, [place], [place] 11/06/2014, 1:33 PM

## 2014-11-06 NOTE — Op Note (Signed)
Cesarean Section Procedure Note   Robin Martin  11/06/2014  Indications: 1. HO PREVIOUS CS.2. GDMA2  3. PIH   Pre-operative Diagnosis: REPEAT CESAREAN SECTION WITH BILATERAL TUBAL LIGATION REMOVAL OF SKIN TAG.   Post-operative Diagnosis: SAME WITH SKIN TAG   Surgeon Kendall West  Assistants: Windy Kalata  Anesthesia: spinal   Procedure Details:  The patient was seen in the Holding Room. The risks, benefits, complications, treatment options, and expected outcomes were discussed with the patient. The patient concurred with the proposed plan, giving informed consent. identified as Robin Martin and the procedure verified as C-Section Delivery. A Time Out was held and the above information confirmed.  After induction of anesthesia, the patient was draped and prepped in the usual sterile manner. A transverse incision was made and carried down through the subcutaneous tissue to the fascia. Fascial incision was made and extended transversely. The fascia was separated from the underlying rectus tissue superiorly and inferiorly. The peritoneum was identified and entered. Peritoneal incision was extended longitudinally.  THERE was an adhesion from the bladder to the uterus which was lysed with the bovie.  alexsis retractor was placed.  The utero-vesical peritoneal reflection was incised transversely and the bladder flap was bluntly freed from the lower uterine segment. A low transverse uterine incision was made. Delivered from cephalic presentation was a  pound Female with Apgar scores of 8 at one minute and 9 at five minutes. Cord ph was not sent the umbilical cord was clamped and cut cord blood was obtained for evaluation. The placenta was removed Intact and appeared normal. The uterine outline, tubes and ovaries appeared normal}. The uterine incision was closed with running locked sutures of 0Vicryl. A second layer of 0 vicryl was used to imbricate the uterus.  The patients left fallopian tube was  grasped at the mid ishtmic portion with babcock clamp, ligated with 2-0 plain and excised.  The patients right fallopian tube was followed out to the fimbriated end.  The mid isthmic portion of the tube was ligated with 2-0 plain and excised.  Both portions of tubes was sent to pathology.     Hemostasis was observed. Lavage was carried out until clear. The fascia was then reapproximated with running sutures of 0Vicryl. The subcuticular closure was performed using 420PLAIN GUT. The skin was closed with 4-0 MONOCRYL. The skin tag was wiped with betadine.  2cc lidocaine was infiltrated at the base. The skin tag was removed with the knife.  Skin was reaproximated with 4-0 vicryl in interrupted suture.  Hemostasis was assured.    Instrument, sponge, and needle counts were correct prior the abdominal closure and were correct at the conclusion of the case.    Findings: FEMALE INFANT VTX. WITH CLEAR FLUID. NUCHAL TIMES ONE.  2 CM RIGHT BUTTOCK SKIN TAG   Estimated Blood Loss: 950CC  Total IV Fluids: 2046ml   Urine Output: 150CC OF clear urine  Specimens: @ORSPECIMEN @   Complications: no complications  Disposition: PACU - hemodynamically stable.   Maternal Condition: stable   Baby condition / location:  Couplet care / Skin to Skin  Attending Attestation: I was present and scrubbed for the entire procedure.   Signed: Surgeon(s): Crawford Givens, MD

## 2014-11-06 NOTE — MAU Note (Signed)
Dr sent her over. BP was elevated, new problem.  Having ? Contractions. Slight HA, denies epigastric pain or visual changes

## 2014-11-07 ENCOUNTER — Encounter (HOSPITAL_COMMUNITY): Payer: Self-pay | Admitting: Obstetrics and Gynecology

## 2014-11-07 LAB — CBC
HCT: 28.7 % — ABNORMAL LOW (ref 36.0–46.0)
Hemoglobin: 9.3 g/dL — ABNORMAL LOW (ref 12.0–15.0)
MCH: 27.7 pg (ref 26.0–34.0)
MCHC: 32.4 g/dL (ref 30.0–36.0)
MCV: 85.4 fL (ref 78.0–100.0)
Platelets: 139 10*3/uL — ABNORMAL LOW (ref 150–400)
RBC: 3.36 MIL/uL — ABNORMAL LOW (ref 3.87–5.11)
RDW: 13.5 % (ref 11.5–15.5)
WBC: 9.3 10*3/uL (ref 4.0–10.5)

## 2014-11-07 LAB — RPR: RPR Ser Ql: NONREACTIVE

## 2014-11-07 LAB — GLUCOSE, CAPILLARY
Glucose-Capillary: 110 mg/dL — ABNORMAL HIGH (ref 65–99)
Glucose-Capillary: 115 mg/dL — ABNORMAL HIGH (ref 65–99)
Glucose-Capillary: 149 mg/dL — ABNORMAL HIGH (ref 65–99)
Glucose-Capillary: 165 mg/dL — ABNORMAL HIGH (ref 65–99)
Glucose-Capillary: 94 mg/dL (ref 65–99)

## 2014-11-07 LAB — HIV ANTIBODY (ROUTINE TESTING W REFLEX): HIV Screen 4th Generation wRfx: NONREACTIVE

## 2014-11-07 MED ORDER — GLYBURIDE 5 MG PO TABS
5.0000 mg | ORAL_TABLET | Freq: Every day | ORAL | Status: DC
  Administered 2014-11-08 (×2): 5 mg via ORAL
  Filled 2014-11-07 (×2): qty 1

## 2014-11-07 NOTE — Clinical Social Work Maternal (Signed)
CLINICAL SOCIAL WORK MATERNAL/CHILD NOTE  Patient Details  Name: Robin Martin MRN: [id number] Date of Birth: 10/01/1980  Date:  11/07/2014  Clinical Social Worker Initiating Note:  Jerah Esty E. Xana Bradt, LCSW Date/ Time Initiated:  11/07/14/1030     Child's Name:  Robin Martin   Legal Guardian:   (Parents: Robin Martin and Robin Martin)   Need for Interpreter:  None   Date of Referral:  11/07/14     Reason for Referral:  Other (Comment) (Mental Health concerns: hx of PPD and Bipolar)   Referral Source:  Physician   Address:  [address] [place], [place] [address]  Phone number:  [phone]   Household Members:  Minor Children, Spouse (MOB has two older sons from a previous marriage: Devon/15 and Conner/11.  Couple has one other child together: Vincent/2.)   Natural Supports (not living in the home):  Immediate Family, Extended Family   Professional Supports:     Employment:     Type of Work:  (MOB works for [employer].  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    Strengths:  Ability to meet basic needs , Compliance with medical plan , Home prepared for child , Understanding of illness, Pediatrician chosen  (Pediatric follow up will be with Dr. Bucy.)   Risk Factors/Current Problems:  Mental Health Concerns  (MOB reports hx of PPD after first two children.  She states dx of Bipolar at age 16 due to "anger issues.")   Cognitive State:  Alert , Linear Thinking , Goal Oriented , Insightful    Mood/Affect:  Interested , Calm , Relaxed    CSW Assessment: CSW met with MOB in her first floor room/122 to introduce services, offer support and complete assessment due to baby's admission to NICU as well as hx of PPD and Bipolar diagnosis.  MOB was pleasant and welcoming of CSW's visit.  She states she is hurting more after this  c-section than she did with her others.  CSW asked if MOB had a BTL and suggests this might contribute to the increased pain.  MOB seems to have a good understanding of baby's need for intensive care, but states she "doesn't like" baby being away from her.  CSW validated and normalized these feelings. CSW discussed common emotions often related to the NICU experience and encouraged MOB to take things one day at a time.  CSW encouraged MOB to focus on her baby and try not to place any expectations on his discharge date, as this often causes disappointment.  CSW inquired about MOB's other postpartum times and MOB states she had very bad PPD after her first baby and mild after her second.  She distinguishes that in both instances, she had no ill feelings towards the baby.  She thinks that her emotions were heightened during this time in her life due to a negative relationship with her husband at the time (now ex-husband).  She reports being prescribed Lexapro and Abilify during this time and states she went off all these medications in 2009, when she left her husband.  She reports feeling great emotionally since that separation.  She states she felt well emotionally during this pregnancy and does not feel the need for any medication or counseling at this point in her life.  CSW reviewed symptoms and the need to talk   with a medical professional if she has concerns about her emotions at any time.  MOB agreed.  CSW asked about the Bipolar diagnosis and MOB replied that she was diagnosed with Bipolar at age 34, when she had "anger issues."  She states she still struggles with anger at times, but knows how to "control it."  She states she knows when she needs space and that walking away and taking a breath allows her to calm down.   CSW provided SIDS education and MOB commits to adhering to SIDS precautions.  She states they have everything they need for baby at home.   MOB states no questions, concerns or needs and  seemed appreciative of CSW's visit.  CSW has no social concerns at this time and identifies no barriers to discharge when infant is medically ready.    CSW Plan/Description:  Patient/Family Education , Psychosocial Support and Ongoing Assessment of Needs    Alphonzo Cruise, [place] 08-05-14, 11:49 AM

## 2014-11-07 NOTE — Progress Notes (Signed)
Subjective: Postpartum Day 1: Cesarean Delivery due to repeat w/BTL Patient up ad lib, reports no syncope or dizziness. Feeding:  Breastpumping, infant in NICU Contraceptive plan:  BTL  Objective: Vital signs in last 24 hours: Temp:  [97.9 F (36.6 C)-98.9 F (37.2 C)] 98.3 F (36.8 C) (10/14 0346) Pulse Rate:  [79-118] 79 (10/14 0346) Resp:  [16-148] 16 (10/14 0346) BP: (119-197)/(63-116) 134/71 mmHg (10/14 0346) SpO2:  [95 %-100 %] 95 % (10/14 0346) Weight:  [316 lb (143.337 kg)] 316 lb (143.337 kg) (10/13 1257)   Filed Vitals:   11/07/14 0015 11/07/14 0018 11/07/14 0020 11/07/14 0346  BP: 128/76 133/84 119/76 134/71  Pulse: 97 101 104 79  Temp: 98.3 F (36.8 C)   98.3 F (36.8 C)  TempSrc: Oral   Oral  Resp: 20   16  Weight:      SpO2: 97%   95%   Physical Exam:  General: alert, appears stated age and morbidly obese Lochia: appropriate Uterine Fundus: firm Abdomen:  + bowel sounds, non distended Incision: no significant drainage  Pressure dressing CDI DVT Evaluation: No evidence of DVT seen on physical exam. Homan's sign: Negative   Recent Labs  11/06/14 1322 11/07/14 0535  HGB 10.8* 9.3*  HCT 33.1* 28.7*  WBC 10.0 9.3  CBG fasting 110 Orthostatics stable.  Assessment: Status post Cesarean section day 1. Doing well postoperatively.  Pressure dressing in place, no significant drainage JP drain in place, no significant drainage Anemia - hemodynamicly stable.   Circumcision: out patient   Plan: Continue current care. Breastfeeding and Lactation consult Dr. Cletis Media updated on patient status Iron supplements   Robin Martin, CNM, MSN 11/07/2014. 10:28 AM

## 2014-11-07 NOTE — Progress Notes (Signed)
Pt's infant being transferred to NICU to stabilize blood sugars. Set up pt with double electric pump and educated on proper use and care. Pt verbalized understanding.

## 2014-11-07 NOTE — Anesthesia Postprocedure Evaluation (Signed)
  Anesthesia Post-op Note  Patient: Robin Martin  Procedure(s) Performed: Procedure(s): REPEAT CESAREAN SECTION WITH BILATERAL TUBAL LIGATION REMOVAL OF SKIN TAG (Bilateral)  Patient Location: Mother/Baby  Anesthesia Type:spinal  Level of Consciousness: awake  Airway and Oxygen Therapy: Patient Spontanous Breathing  Post-op Pain: mild  Post-op Assessment: Patient's Cardiovascular Status Stable and Respiratory Function Stable              Post-op Vital Signs: stable  Last Vitals:  Filed Vitals:   11/07/14 0346  BP: 134/71  Pulse: 79  Temp: 36.8 C  Resp: 16    Complications: No apparent anesthesia complications

## 2014-11-07 NOTE — Addendum Note (Signed)
Addendum  created 11/07/14 0953 by Ignacia Bayley, CRNA   Modules edited: Notes Section   Notes Section:  File: [id number]

## 2014-11-07 NOTE — Progress Notes (Signed)
UR chart review completed.  

## 2014-11-08 LAB — GLUCOSE, CAPILLARY
Glucose-Capillary: 124 mg/dL — ABNORMAL HIGH (ref 65–99)
Glucose-Capillary: 138 mg/dL — ABNORMAL HIGH (ref 65–99)
Glucose-Capillary: 142 mg/dL — ABNORMAL HIGH (ref 65–99)
Glucose-Capillary: 95 mg/dL (ref 65–99)

## 2014-11-08 NOTE — Lactation Note (Signed)
This note was copied from the chart of [name]. Lactation Consultation Note  Patient Name: Robin Martin Today's Date: 11/08/2014 Reason for consult: Initial assessment;NICU baby Baby in NICU for low blood sugars. Mom GDM - insulin. RN had set up DEBP for Mom and she reports pumping every 3 hours for 15 minutes except last night. She is getting few drops. Baby is going to the breast with some feedings but not consistently. Mom doing STS when able. Stressed to [employer] importance of consistent pumping to encourage milk production, protect milk supply. Encouraged to continue STS as much as possible, offer breast when possible. Mom has DEBP for home use at d/c. NICU booklet given to Mom, breast milk storage guidelines reviewed. Mom denies other questions/concerns.   Maternal Data Has patient been taught Hand Expression?: Yes Does the patient have breastfeeding experience prior to this delivery?: Yes  Feeding Feeding Type: Formula Nipple Type: Slow - flow Length of feed: 20 min  LATCH Score/Interventions                      Lactation Tools Discussed/Used Tools: Pump Breast pump type: Double-Electric Breast Pump   Consult Status Consult Status: Follow-up Date: 11/09/14 Follow-up type: In-patient    Katrine Coho 11/08/2014, 2:34 PM

## 2014-11-08 NOTE — Progress Notes (Signed)
Subjective: Postpartum Day 2: Cesarean Delivery due to repeat w/BTL Patient up ad lib, reports no syncope or dizziness. Feeding:  breast Contraceptive plan:  BTL Baby in NICU, doing well  Objective: Vital signs in last 24 hours: Temp:  [97.9 F (36.6 C)-98.3 F (36.8 C)] 97.9 F (36.6 C) (10/15 0500) Pulse Rate:  [72-85] 76 (10/15 0500) Resp:  [17-20] 17 (10/15 0500) BP: (119-128)/(67-78) 126/68 mmHg (10/15 0500) SpO2:  [96 %-98 %] 96 % (10/14 1745) Results for orders placed or performed during the hospital encounter of 11/06/14 (from the past 24 hour(s))  Glucose, capillary     Status: Abnormal   Collection Time: 11/07/14  1:55 PM  Result Value Ref Range   Glucose-Capillary 165 (H) 65 - 99 mg/dL   Comment 1 Notify RN   Glucose, capillary     Status: Abnormal   Collection Time: 11/07/14  6:07 PM  Result Value Ref Range   Glucose-Capillary 115 (H) 65 - 99 mg/dL  Glucose, capillary     Status: Abnormal   Collection Time: 11/08/14 12:28 AM  Result Value Ref Range   Glucose-Capillary 142 (H) 65 - 99 mg/dL    Physical Exam:  General: alert and cooperative Lochia: appropriate Uterine Fundus: firm Abdomen:  + bowel sounds, non distended Incision: no significant drainage  Honeycomb dressing CDI DVT Evaluation: No evidence of DVT seen on physical exam. Homan's sign: Negative   Recent Labs  11/06/14 1322 11/07/14 0535  HGB 10.8* 9.3*  HCT 33.1* 28.7*  WBC 10.0 9.3   Orthostatics stable.  Assessment: Status post Cesarean section day 2. Doing well postoperatively.  Honeycomb dressing in place, no significant drainage JP drain in place, no significant drainage  Anemia - hemodynamicly stable.  Circumcision: out patient   Plan: Continue current care. Plan for discharge tomorrow and Breastfeeding     Breckin Zafar, CNM, MSN 11/08/2014. 7:23 AM

## 2014-11-08 NOTE — Progress Notes (Signed)
Patient was in NICU during shift change rounds. When I entered room to introduce myself and discuss blood sugars and needing a fasting blood sugar, patient stated she had already eaten breakfast. Patient stated the night nurse got a fasting blood sugar last night and gave her glyburide as ordered. Encouraged patient to let us know when she eats so we can get her 2 hour post prandial blood sugars. Patient agrees and states she has been reporting her meal time to allow for blood sugars. Will continue to monitor. Maxwell Caul, Leretha Dykes [place]

## 2014-11-09 ENCOUNTER — Ambulatory Visit: Payer: Self-pay

## 2014-11-09 LAB — GLUCOSE, CAPILLARY: Glucose-Capillary: 74 mg/dL (ref 65–99)

## 2014-11-09 MED ORDER — LABETALOL HCL 200 MG PO TABS
200.0000 mg | ORAL_TABLET | Freq: Two times a day (BID) | ORAL | Status: DC
Start: 2014-11-09 — End: 2014-11-09

## 2014-11-09 MED ORDER — OXYCODONE-ACETAMINOPHEN 5-325 MG PO TABS: 1.0000 | ORAL_TABLET | ORAL | Status: DC | PRN

## 2014-11-09 MED ORDER — GLYBURIDE 5 MG PO TABS: 5.0000 mg | ORAL_TABLET | Freq: Every day | ORAL | Status: DC

## 2014-11-09 MED ORDER — FERROUS SULFATE 325 (65 FE) MG PO TABS
325.0000 mg | ORAL_TABLET | Freq: Every day | ORAL | Status: DC
Start: 2014-11-09 — End: 2015-04-17

## 2014-11-09 MED ORDER — LABETALOL HCL 200 MG PO TABS: 200.0000 mg | ORAL_TABLET | Freq: Two times a day (BID) | ORAL | Status: DC

## 2014-11-09 MED ORDER — IBUPROFEN 600 MG PO TABS: 600.0000 mg | ORAL_TABLET | Freq: Four times a day (QID) | ORAL | Status: DC | PRN

## 2014-11-09 NOTE — Discharge Summary (Signed)
OB Discharge Summary     Patient Name: Robin Martin DOB: 18-Mar-1980 MRN: [id number]  Date of admission: 11/06/2014 Delivering MD: Crawford Givens   Date of discharge: 11/10/2014  Admitting diagnosis: 37WKS, INCREASED BP Prior Cesarean Section Intrauterine pregnancy: [redacted]w[redacted]d     Secondary diagnosis: Gestational Diabetes medication controlled (A2), morbid obesity, polyhydramnios, bipolar, 2 vessel cord, gestational hypertension, mild     Discharge diagnosis: Term Pregnancy Delivered and Repeat LTCS, BTL                                                                                                Post partum procedures:No need for Rhophylac--baby O neg  Augmentation: None  Complications: None  Hospital course:  Sceduled C/S   34 y.o. yo [id number] at [redacted]w[redacted]d was admitted to the hospital 11/06/2014 for scheduled cesarean section with the following indication:Elective Repeat., mild gestational hypertension Membrane Rupture Time/Date: 4:54 PM ,11/06/2014   Patient delivered a Viable infant.11/06/2014  Details of operation can be found in separate operative note.  Pateint had an uncomplicated postpartum course.  She is ambulating, tolerating a regular diet, passing flatus, and urinating well. Patient is discharged home in stable condition on 11/09/14.  JP drain removed on day 3, minimal drainage.  Patient started on Glyburide 5 mg po q hs on day 1 pp.  She will continue to monitor CBGs at home, and continue Glyburide.  She was begun on Labetalol 200 mg po BID on the day of d/c. [facility] RN will see patient this coming week for BP check and incision check.  She will RTO in 2 weeks for recheck of incision and CBG review.  CBG (last 3)   Recent Labs  11/08/14 1638 11/08/14 2112 11/09/14 0553  GLUCAP 124* 138* 74      Physical exam  Filed Vitals:   11/08/14 1842 11/08/14 2123 11/08/14 2153 11/09/14 0606  BP: 144/91 146/69 141/84 141/74  Pulse: 85 84  79  Temp: 98.7 F (37.1 C)  98.4 F (36.9 C)  98.2 F (36.8 C)  TempSrc: Oral Oral  Oral  Resp: 18 20  20   Weight:      SpO2: 100%      General: alert Lochia: appropriate Uterine Fundus: firm Incision: Honeycomb dressing with old drainage--removed and new one applied on DOD.  Area of skin rxn/erythema at location of surgical drape.   DVT Evaluation: No evidence of DVT seen on physical exam. Negative Homan's sign. Labs: Lab Results  Component Value Date   WBC 9.3 11/07/2014   HGB 9.3* 11/07/2014   HCT 28.7* 11/07/2014   MCV 85.4 11/07/2014   PLT 139* 11/07/2014   CMP Latest Ref Rng 11/06/2014  Glucose 65 - 99 mg/dL 104(H)  BUN 6 - 20 mg/dL 8  Creatinine 0.44 - 1.00 mg/dL 0.52  Sodium 135 - 145 mmol/L 135  Potassium 3.5 - 5.1 mmol/L 3.9  Chloride 101 - 111 mmol/L 106  CO2 22 - 32 mmol/L 23  Calcium 8.9 - 10.3 mg/dL 9.0  Total Protein 6.5 - 8.1 g/dL 7.0  Total Bilirubin 0.3 - 1.2 mg/dL  0.7  Alkaline Phos 38 - 126 U/L 156(H)  AST 15 - 41 U/L 16  ALT 14 - 54 U/L 11(L)    Discharge instruction: per After Visit Summary and "Baby and Me Booklet".  Medications: No current facility-administered medications for this encounter.  Current outpatient prescriptions:  .  insulin NPH Human (HUMULIN N,NOVOLIN N) 100 UNIT/ML injection, Inject 28-32 Units into the skin 2 (two) times daily before a meal. Take 32 units in the morning and 28 units at night., Disp: , Rfl:  .  insulin regular (NOVOLIN R,HUMULIN R) 100 units/mL injection, Inject 12-14 Units into the skin 2 (two) times daily. Take 14 units in the morning and then take 12 units at bedtime., Disp: , Rfl:  .  pantoprazole (PROTONIX) 40 MG tablet, Take 40 mg by mouth daily., Disp: , Rfl:  .  cetirizine (ZYRTEC) 10 MG tablet, Take 1 tablet (10 mg total) by mouth daily. (Patient not taking: Reported on 10/31/2014), Disp: 30 tablet, Rfl: 4 .  cyclobenzaprine (FLEXERIL) 10 MG tablet, Take 1 tablet (10 mg total) by mouth 2 (two) times daily as needed for muscle  spasms. (Patient not taking: Reported on 09/13/2014), Disp: 20 tablet, Rfl: 0 .  ferrous sulfate 325 (65 FE) MG tablet, Take 1 tablet (325 mg total) by mouth daily with breakfast., Disp: 30 tablet, Rfl: 3 .  glyBURIDE (DIABETA) 5 MG tablet, Take 1 tablet (5 mg total) by mouth at bedtime., Disp: 30 tablet, Rfl: 2 .  ibuprofen (ADVIL,MOTRIN) 600 MG tablet, Take 1 tablet (600 mg total) by mouth every 6 (six) hours as needed., Disp: 30 tablet, Rfl: 2 .  labetalol (NORMODYNE) 200 MG tablet, Take 1 tablet (200 mg total) by mouth 2 (two) times daily., Disp: 60 tablet, Rfl: 2 .  oxyCODONE-acetaminophen (PERCOCET/ROXICET) 5-325 MG tablet, Take 1-2 tablets by mouth every 4 (four) hours as needed for moderate pain., Disp: 30 tablet, Rfl: 0  Diet: routine diet/carb modified  Activity: Advance as tolerated. Pelvic rest for 6 weeks.   Outpatient follow up:2 weeks for incision check and review of CBGs  Postpartum contraception: Tubal Ligation  Newborn Data: Live born female  Birth Weight: 9 lb 12.8 oz (4445 g) APGAR: 8, 9  Baby Feeding: Breast Disposition:NICU due to blood sugar instability--stable   11/10/2014 Donnel Saxon, CNM

## 2014-11-09 NOTE — Discharge Instructions (Signed)

## 2014-11-09 NOTE — Lactation Note (Signed)
This note was copied from the chart of [place]. Lactation Consultation Note: Mother in good spirits about infants condition as well as milk volume increasing. Mother states she pumped 3 bullets this morning and took to infant in NICU. Reviewed collection , storage and transporting milk according to guidelines. Advised mother to follow up with [facility] in am or staff nurse for assistance with latching infant. Advised to continue to do frequent STS. Mother to continue to pump every 2-3 hours and do good breast massage. Mother advised in treatment to prevent engorgement. Encouraged mother to schedule a follow up with [facility] after infant is discharged. Parents receptive to all teaching.   Patient Name: Robin Martin Today's Date: 11/09/2014     Maternal Data    Feeding Feeding Type: Formula Nipple Type: Slow - flow Length of feed: 10 min  LATCH Score/Interventions                      Lactation Tools Discussed/Used     Consult Status      Darla Lesches 11/09/2014, 6:46 PM

## 2014-11-10 LAB — TYPE AND SCREEN
ABO/RH(D): O NEG
Antibody Screen: POSITIVE
DAT, IgG: NEGATIVE
Unit division: 0
Unit division: 0

## 2014-11-11 ENCOUNTER — Other Ambulatory Visit (HOSPITAL_COMMUNITY): Payer: Self-pay

## 2014-11-18 ENCOUNTER — Inpatient Hospital Stay (HOSPITAL_COMMUNITY)
Admission: AD | Admit: 2014-11-18 | Discharge: 2014-11-18 | Disposition: A | Discharge status: Home / Self Care | Payer: Medicaid Other | Source: Ambulatory Visit | Attending: Obstetrics and Gynecology | Admitting: Obstetrics and Gynecology

## 2014-11-18 DIAGNOSIS — R600 Localized edema: Secondary | ICD-10-CM | POA: Diagnosis not present

## 2014-11-18 DIAGNOSIS — O1205 Gestational edema, complicating the puerperium: Secondary | ICD-10-CM | POA: Diagnosis present

## 2014-11-18 DIAGNOSIS — E119 Type 2 diabetes mellitus without complications: Secondary | ICD-10-CM | POA: Diagnosis not present

## 2014-11-18 DIAGNOSIS — O1495 Unspecified pre-eclampsia, complicating the puerperium: Secondary | ICD-10-CM | POA: Insufficient documentation

## 2014-11-18 DIAGNOSIS — I10 Essential (primary) hypertension: Secondary | ICD-10-CM

## 2014-11-18 DIAGNOSIS — Z98891 History of uterine scar from previous surgery: Secondary | ICD-10-CM

## 2014-11-18 LAB — COMPREHENSIVE METABOLIC PANEL
ALT: 22 U/L (ref 14–54)
AST: 23 U/L (ref 15–41)
Albumin: 3.4 g/dL — ABNORMAL LOW (ref 3.5–5.0)
Alkaline Phosphatase: 102 U/L (ref 38–126)
Anion gap: 7 (ref 5–15)
BUN: 9 mg/dL (ref 6–20)
CO2: 26 mmol/L (ref 22–32)
Calcium: 8.7 mg/dL — ABNORMAL LOW (ref 8.9–10.3)
Chloride: 108 mmol/L (ref 101–111)
Creatinine, Ser: 0.66 mg/dL (ref 0.44–1.00)
GFR calc Af Amer: >60 mL/min (ref >60)
GFR calc non Af Amer: >60 mL/min (ref >60)
Glucose, Bld: 98 mg/dL (ref 65–99)
Potassium: 4 mmol/L (ref 3.5–5.1)
Sodium: 141 mmol/L (ref 135–145)
Total Bilirubin: 0.9 mg/dL (ref 0.3–1.2)
Total Protein: 7.4 g/dL (ref 6.5–8.1)

## 2014-11-18 LAB — CBC
HCT: 32 % — ABNORMAL LOW (ref 36.0–46.0)
Hemoglobin: 10 g/dL — ABNORMAL LOW (ref 12.0–15.0)
MCH: 27.2 pg (ref 26.0–34.0)
MCHC: 31.3 g/dL (ref 30.0–36.0)
MCV: 87.2 fL (ref 78.0–100.0)
Platelets: 269 10*3/uL (ref 150–400)
RBC: 3.67 MIL/uL — ABNORMAL LOW (ref 3.87–5.11)
RDW: 14.3 % (ref 11.5–15.5)
WBC: 6 10*3/uL (ref 4.0–10.5)

## 2014-11-18 LAB — URIC ACID: Uric Acid, Serum: 4.6 mg/dL (ref 2.3–6.6)

## 2014-11-18 LAB — PROTEIN / CREATININE RATIO, URINE
Creatinine, Urine: 98 mg/dL
Protein Creatinine Ratio: 0.12 mg/mg{Cre} (ref 0.00–0.15)
Total Protein, Urine: 12 mg/dL

## 2014-11-18 LAB — LACTATE DEHYDROGENASE: LDH: 173 U/L (ref 98–192)

## 2014-11-18 MED ORDER — HYDROCHLOROTHIAZIDE 25 MG PO TABS: 25.0000 mg | ORAL_TABLET | Freq: Every day | ORAL | Status: DC

## 2014-11-18 MED ORDER — LABETALOL HCL 100 MG PO TABS: 200.0000 mg | ORAL_TABLET | Freq: Once | ORAL | Status: DC

## 2014-11-18 MED ORDER — LABETALOL HCL 100 MG PO TABS
300.0000 mg | ORAL_TABLET | Freq: Once | ORAL | Status: AC
  Administered 2014-11-18: 300 mg via ORAL
  Filled 2014-11-18: qty 3

## 2014-11-18 MED ORDER — LIDOCAINE HCL 2 % EX GEL
1.0000 "application " | Freq: Once | CUTANEOUS | Status: DC
  Filled 2014-11-18: qty 5

## 2014-11-18 MED ORDER — LABETALOL HCL 300 MG PO TABS: 300.0000 mg | ORAL_TABLET | Freq: Two times a day (BID) | ORAL | Status: DC

## 2014-11-18 NOTE — Discharge Instructions (Signed)
Hypertension Hypertension, commonly called high blood pressure, is when the force of blood pumping through your arteries is too strong. Your arteries are the blood vessels that carry blood from your heart throughout your body. A blood pressure reading consists of a higher number over a lower number, such as 110/72. The higher number (systolic) is the pressure inside your arteries when your heart pumps. The lower number (diastolic) is the pressure inside your arteries when your heart relaxes. Ideally you want your blood pressure below 120/80. Hypertension forces your heart to work harder to pump blood. Your arteries may become narrow or stiff. Having untreated or uncontrolled hypertension can cause heart attack, stroke, kidney disease, and other problems. RISK FACTORS Some risk factors for high blood pressure are controllable. Others are not.  Risk factors you cannot control include:   Race. You may be at higher risk if you are African American.  Age. Risk increases with age.  Gender. Men are at higher risk than women before age 52 years. After age 51, women are at higher risk than men. Risk factors you can control include:  Not getting enough exercise or physical activity.  Being overweight.  Getting too much fat, sugar, calories, or salt in your diet.  Drinking too much alcohol. SIGNS AND SYMPTOMS Hypertension does not usually cause signs or symptoms. Extremely high blood pressure (hypertensive crisis) may cause headache, anxiety, shortness of breath, and nosebleed. DIAGNOSIS To check if you have hypertension, your health care provider will measure your blood pressure while you are seated, with your arm held at the level of your heart. It should be measured at least twice using the same arm. Certain conditions can cause a difference in blood pressure between your right and left arms. A blood pressure reading that is higher than normal on one occasion does not mean that you need treatment.  If it is not clear whether you have high blood pressure, you may be asked to return on a different day to have your blood pressure checked again. Or, you may be asked to monitor your blood pressure at home for 1 or more weeks. TREATMENT Treating high blood pressure includes making lifestyle changes and possibly taking medicine. Living a healthy lifestyle can help lower high blood pressure. You may need to change some of your habits. Lifestyle changes may include:  Following the DASH diet. This diet is high in fruits, vegetables, and whole grains. It is low in salt, red meat, and added sugars.  Keep your sodium intake below 2,300 mg per day.  Getting at least 30-45 minutes of aerobic exercise at least 4 times per week.  Losing weight if necessary.  Not smoking.  Limiting alcoholic beverages.  Learning ways to reduce stress. Your health care provider may prescribe medicine if lifestyle changes are not enough to get your blood pressure under control, and if one of the following is true:  You are 34-43 years of age and your systolic blood pressure is above 140.  You are 20 years of age or older, and your systolic blood pressure is above 150.  Your diastolic blood pressure is above 90.  You have diabetes, and your systolic blood pressure is over [id number] or your diastolic blood pressure is over 90.  You have kidney disease and your blood pressure is above 140/90.  You have heart disease and your blood pressure is above 140/90. Your personal target blood pressure may vary depending on your medical conditions, your age, and other factors. HOME CARE INSTRUCTIONS  Have your blood pressure rechecked as directed by your health care provider.   Take medicines only as directed by your health care provider. Follow the directions carefully. Blood pressure medicines must be taken as prescribed. The medicine does not work as well when you skip doses. Skipping doses also puts you at risk for  problems.  Do not smoke.   Monitor your blood pressure at home as directed by your health care provider. SEEK MEDICAL CARE IF:   You think you are having a reaction to medicines taken.  You have recurrent headaches or feel dizzy.  You have swelling in your ankles.  You have trouble with your vision. SEEK IMMEDIATE MEDICAL CARE IF:  You develop a severe headache or confusion.  You have unusual weakness, numbness, or feel faint.  You have severe chest or abdominal pain.  You vomit repeatedly.  You have trouble breathing. MAKE SURE YOU:   Understand these instructions.  Will watch your condition.  Will get help right away if you are not doing well or get worse.   This information is not intended to replace advice given to you by your health care provider. Make sure you discuss any questions you have with your health care provider.   Document Released: 01/10/2005 Document Revised: 05/27/2014 Document Reviewed: 11/02/2012 Elsevier Interactive Patient Education 2016 [place] A disposable sitz bath is a plastic basin that fits over the toilet. A bag is hung above the toilet and is connected to a tube that opens into the disposable sitz bath. The bag is filled with warm water that can flow into the basin through the tube.  HOW TO USE A DISPOSABLE SITZ BATH  Close the clamp on the tubing before filling the bag with water. This is to prevent leakage.  Fill the sitz bath basin and the plastic bag with warm water.  Place the filled basin on the toilet with the seat raised. Make sure the overflow opening is facing toward the back of the toilet.  Hang the filled plastic bag overhead on a hook or towel rack close to the toilet. When the bag is unclamped, a steady stream of water will flow from the bag, through the tubing, and into the basin.  Attach the tubing to the opening on the basin.  Sit on the basin positioned on the toilet seat and release the  clamp. This will allow warm water to flush the area around your genitals and anus (perineum).  Remain sitting on the basin for approximately 15 to 20 minutes.  Stand up and pat the perineum area dry. If needed, apply clean bandages (dressings) to the affected area.  Tip the basin into the toilet to remove any remaining water and flush the toilet.  Wash the basin with warm water and soap. Let it dry in the sink.  Store the basin and tubing in a clean, dry area.  Wash your hands with soap and water. SEEK MEDICAL CARE IF: You get worse instead of better. Stop the sitz baths if you get worse. MAKE SURE YOU:  Understand these instructions.  Will watch your condition.  Will get help right away if you are not doing well or get worse.   This information is not intended to replace advice given to you by your health care provider. Make sure you discuss any questions you have with your health care provider.   Document Released: 07/12/2011 Document Revised: 10/05/2011 Document Reviewed: 07/12/2011 Elsevier Interactive Patient Education [employer].

## 2014-11-18 NOTE — MAU Note (Signed)
C/S 10/13. Today C/O swelling and pain in feet and legs. Possible infection where skin tag was removed from perineum.

## 2014-11-18 NOTE — MAU Provider Note (Signed)
[place]  [id number]   S: Continuation and completion of visit started by V. [place] assumed.  Patient had sutures removed by V.[place] and [facility] drawn.  Patient reports slight headache, but no other complaints.  Reports currently breastfeeding and that she has visit, at office, in AM with Ival Bible, FNP.  Patient Active Problem List   Diagnosis Date Noted  . Polyhydramnios in third trimester--90%ile, 22.78 11/06/14 11/06/2014  . S/P cesarean section 11/06/2014  . BMI 50.0-59.9, adult ([place]) 11/04/2014  . Positive GBS test 11/04/2014  . H/O postpartum depression, currently pregnant 11/04/2014  . Request for sterilization--consent signed 08/26/14 11/04/2014  . Hypertension--sporadic, related to weight 11/04/2014  . Previous cesarean section x 2 09/13/2014  . Bipolar 1 disorder ([place]) 09/13/2014  . Latex allergy 09/13/2014  .  Hx LGA (large for gestational age) fetus x 2 (9+) 09/13/2014  . Allergy to morphine 09/13/2014  . Gestational diabetes--on insulin 09/13/2014  . 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 Hx of postpartum hemorrhage, currently pregnant--with 1st pregnancy, patient reports cardiac arrest 09/13/2014  . Chronic back pain 09/13/2014  . H/O LEEP--2006 09/13/2014  . Rh negative, maternal 04/06/2012    O:  Filed Vitals:   11/18/14 1930 11/18/14 2000 11/18/14 2030 11/18/14 2031  BP: 167/86 158/87 160/89 160/89  Pulse: 66 64 65 65  Temp:      TempSrc:      Resp:       Results for orders placed or performed during the hospital encounter of 11/18/14 (from the past 24 hour(s))  Protein / creatinine ratio, urine     Status: None   Collection Time: 11/18/14  6:42 PM  Result Value Ref Range   Creatinine, Urine 98.00 mg/dL   Total Protein, Urine 12 mg/dL   Protein Creatinine Ratio 0.12 0.00 - 0.15 mg/mg[Cre]  CBC     Status: Abnormal   Collection Time: 11/18/14  7:20 PM  Result Value Ref Range   WBC 6.0 4.0 - 10.5 K/uL   RBC 3.67 (L) 3.87 - 5.11 MIL/uL   Hemoglobin  10.0 (L) 12.0 - 15.0 g/dL   HCT 32.0 (L) 36.0 - 46.0 %   MCV 87.2 78.0 - 100.0 fL   MCH 27.2 26.0 - 34.0 pg   MCHC 31.3 30.0 - 36.0 g/dL   RDW 14.3 11.5 - 15.5 %   Platelets 269 150 - 400 K/uL  Comprehensive metabolic panel     Status: Abnormal   Collection Time: 11/18/14  7:20 PM  Result Value Ref Range   Sodium 141 135 - 145 mmol/L   Potassium 4.0 3.5 - 5.1 mmol/L   Chloride 108 101 - 111 mmol/L   CO2 26 22 - 32 mmol/L   Glucose, Bld 98 65 - 99 mg/dL   BUN 9 6 - 20 mg/dL   Creatinine, Ser 0.66 0.44 - 1.00 mg/dL   Calcium 8.7 (L) 8.9 - 10.3 mg/dL   Total Protein 7.4 6.5 - 8.1 g/dL   Albumin 3.4 (L) 3.5 - 5.0 g/dL   AST 23 15 - 41 U/L   ALT 22 14 - 54 U/L   Alkaline Phosphatase 102 38 - 126 U/L   Total Bilirubin 0.9 0.3 - 1.2 mg/dL   GFR calc non Af Amer >60 >60 mL/min   GFR calc Af Amer >60 >60 mL/min   Anion gap 7 5 - 15  Lactate dehydrogenase     Status: None   Collection Time: 11/18/14  7:20 PM  Result  Value Ref Range   LDH 173 98 - 192 U/L  Uric acid     Status: None   Collection Time: 11/18/14  7:20 PM  Result Value Ref Range   Uric Acid, Serum 4.6 2.3 - 6.6 mg/dL   A: S/P C/S Postpartum Period CHTN Edema DM  P: Dr. Sophronia Simas consulted regarding BP Advised to increase labetalol from 200mg  BID to 300mg  BID Start HCTZ 25 mg for edema Patient informed of POC and agrees Continue glyburide as ordered Keep appt as scheduled: 11/19/2014 Encouraged to call if any questions or concerns arise prior to next scheduled office visit.  Discharged to home in improved condition  Sedalia Muta MSN, CNM 11/18/2014 10:29 PM

## 2014-11-18 NOTE — MAU Note (Signed)
Urine in lab 

## 2014-11-18 NOTE — MAU Provider Note (Signed)
History   34 yo [id number] s/p repeat C/S on 10/13 presented after calling office with increased edema of legs, mild HA, and sensitivity of skin tag removal site on right inner thigh.  Patient denies visual sx or epigastric pain--on Labetalol 200 mg po BID from day of d/c due to mild elevation of BP.  Saw Earnstine Regal, PA-C, for C/S incision check, with evidence of mild skin rxn to steri-strips--using Desitin for comfort.  Patient is breastfeeding.  Patient Active Problem List   Diagnosis Date Noted  . Polyhydramnios in third trimester--90%ile, 22.78 11/06/14 11/06/2014  . S/P cesarean section 11/06/2014  . BMI 50.0-59.9, adult ([place]) 11/04/2014  . Positive GBS test 11/04/2014  . H/O postpartum depression, currently pregnant 11/04/2014  . Request for sterilization--consent signed 08/26/14 11/04/2014  . Hypertension--sporadic, related to weight 11/04/2014  . Previous cesarean section x 2 09/13/2014  . Bipolar 1 disorder ([place]) 09/13/2014  . Latex allergy 09/13/2014  .  Hx LGA (large for gestational age) fetus x 2 (9+) 09/13/2014  . Allergy to morphine 09/13/2014  . Gestational diabetes--on insulin 09/13/2014  . 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 Hx of postpartum hemorrhage, currently pregnant--with 1st pregnancy, patient reports cardiac arrest 09/13/2014  . Chronic back pain 09/13/2014  . H/O LEEP--2006 09/13/2014  . Rh negative, maternal 04/06/2012    No chief complaint on file.  HPI:  As above  OB History    Gravida Para Term Preterm AB TAB SAB Ectopic Multiple Living   5 4 3 1 1  0 1 0 0 4      Past Medical History  Diagnosis Date  . Hypertension   . Herpes   . Pregnancy induced hypertension   . Abnormal Pap smear   . Depression     post-partum depression  . Bipolar 1 disorder ([place])   . Heartburn in pregnancy   . GERD (gastroesophageal reflux disease)     h/o GERD- wt loss alleviated it  . Infection     urinary tract infection  . Chronic kidney disease     kidney stone at  age 31  . Ovarian cyst     age 78  . Cancer ([place]) 2005    LEEP-cervical cancer  . Gestational diabetes mellitus, antepartum     Past Surgical History  Procedure Laterality Date  . [place]    . Cesarean section with bilateral tubal ligation Bilateral 04/09/2012    BTL not done  . Cesarean section      x2--2005 and 2014  . Cesarean section with bilateral tubal ligation Bilateral 11/06/2014    Procedure: REPEAT CESAREAN SECTION WITH BILATERAL TUBAL LIGATION REMOVAL OF SKIN TAG;  Surgeon: Crawford Givens, MD;  Location: [facility];  Service: Obstetrics;  Laterality: Bilateral;    Family History  Problem Relation Age of Onset  . Diabetes Mother   . Hypertension Mother   . Mental illness Mother   . Diabetes Father   . Hypertension Father   . Asthma Sister   . Diabetes Sister   . Diabetes Maternal Grandmother     Social History  Substance Use Topics  . Smoking status: Former Smoker -- 0.50 packs/day    Types: Cigarettes    Quit date: 03/22/2012  . Smokeless tobacco: Never Used  . Alcohol Use: No    Allergies:  Allergies  Allergen Reactions  . Chocolate Other (See Comments)    Thins blood  . Latex Rash  . Morphine And Related Itching, Nausea And Vomiting and Rash  Prescriptions prior to admission  Medication Sig Dispense Refill Last Dose  . cetirizine (ZYRTEC) 10 MG tablet Take 1 tablet (10 mg total) by mouth daily. (Patient not taking: Reported on 10/31/2014) 30 tablet 4   . cyclobenzaprine (FLEXERIL) 10 MG tablet Take 1 tablet (10 mg total) by mouth 2 (two) times daily as needed for muscle spasms. (Patient not taking: Reported on 09/13/2014) 20 tablet 0 Not Taking at Unknown time  . ferrous sulfate 325 (65 FE) MG tablet Take 1 tablet (325 mg total) by mouth daily with breakfast. 30 tablet 3   . glyBURIDE (DIABETA) 5 MG tablet Take 1 tablet (5 mg total) by mouth at bedtime. 30 tablet 2   . ibuprofen (ADVIL,MOTRIN) 600 MG tablet Take 1 tablet (600 mg total) by mouth every 6 (six)  hours as needed. 30 tablet 2   . insulin NPH Human (HUMULIN N,NOVOLIN N) 100 UNIT/ML injection Inject 28-32 Units into the skin 2 (two) times daily before a meal. Take 32 units in the morning and 28 units at night.   11/06/2014 at Unknown time  . insulin regular (NOVOLIN R,HUMULIN R) 100 units/mL injection Inject 12-14 Units into the skin 2 (two) times daily. Take 14 units in the morning and then take 12 units at bedtime.   11/06/2014 at Unknown time  . labetalol (NORMODYNE) 200 MG tablet Take 1 tablet (200 mg total) by mouth 2 (two) times daily. 60 tablet 2   . oxyCODONE-acetaminophen (PERCOCET/ROXICET) 5-325 MG tablet Take 1-2 tablets by mouth every 4 (four) hours as needed for moderate pain. 30 tablet 0   . pantoprazole (PROTONIX) 40 MG tablet Take 40 mg by mouth daily.   11/06/2014 at Unknown time    ROS:  Pedal edema, mild HA, sensitivity at skin tag removal site Physical Exam   Blood pressure 167/97, pulse 70, temperature 98.5 F (36.9 C), temperature source Oral, resp. rate 20, unknown if currently breastfeeding.    Physical Exam  In NAD Chest clear Heart RRR without murmur Abd soft, NT C/S incision--healing well, resolving surrounding contact irritation from steristrips Skin tag site--sutures intact, with local inflammation around sutures--no surrounding cellulitis, no drainage. Uterus--fundus firm, approx 14 week size, NT Lochia scant Ext--DTR 2+, no clonus, 2+ pitting edema  ED Course  Assessment: S/p repeat C/S 11/06/14 Edema of LE Gestational hypertension--on Labetalol 200 mg po BID Healing skin tag removal site--likely rxn to suture material  Plan: PIH labs, UA, and PCR--patient understands may need cath specimen for more accurate PCR evaluation Removal of sutures from skin tag site--patient more comfortable right away. Sitz bath use prn Lidocaine 2% gel for prn use. Gavin Pound, CNM, will consult with Dr. Charlesetta Garibaldi regarding plan of care   Donnel Saxon CNM,  MSN 11/18/2014 7:03 PM

## 2015-03-09 ENCOUNTER — Emergency Department (HOSPITAL_COMMUNITY): Payer: BLUE CROSS/BLUE SHIELD

## 2015-03-09 ENCOUNTER — Emergency Department (HOSPITAL_COMMUNITY)
Admission: EM | Admit: 2015-03-09 | Discharge: 2015-03-09 | Disposition: A | Discharge status: Home / Self Care | Payer: BLUE CROSS/BLUE SHIELD | Attending: Emergency Medicine | Admitting: Emergency Medicine

## 2015-03-09 ENCOUNTER — Encounter (HOSPITAL_COMMUNITY): Payer: Self-pay | Admitting: *Deleted

## 2015-03-09 DIAGNOSIS — Z79899 Other long term (current) drug therapy: Secondary | ICD-10-CM | POA: Diagnosis not present

## 2015-03-09 DIAGNOSIS — Z8541 Personal history of malignant neoplasm of cervix uteri: Secondary | ICD-10-CM | POA: Insufficient documentation

## 2015-03-09 DIAGNOSIS — Z8659 Personal history of other mental and behavioral disorders: Secondary | ICD-10-CM | POA: Diagnosis not present

## 2015-03-09 DIAGNOSIS — I129 Hypertensive chronic kidney disease with stage 1 through stage 4 chronic kidney disease, or unspecified chronic kidney disease: Secondary | ICD-10-CM | POA: Insufficient documentation

## 2015-03-09 DIAGNOSIS — M419 Scoliosis, unspecified: Secondary | ICD-10-CM

## 2015-03-09 DIAGNOSIS — M5432 Sciatica, left side: Secondary | ICD-10-CM | POA: Diagnosis not present

## 2015-03-09 DIAGNOSIS — Z8742 Personal history of other diseases of the female genital tract: Secondary | ICD-10-CM | POA: Diagnosis not present

## 2015-03-09 DIAGNOSIS — Z8632 Personal history of gestational diabetes: Secondary | ICD-10-CM | POA: Insufficient documentation

## 2015-03-09 DIAGNOSIS — N189 Chronic kidney disease, unspecified: Secondary | ICD-10-CM | POA: Insufficient documentation

## 2015-03-09 DIAGNOSIS — Z8619 Personal history of other infectious and parasitic diseases: Secondary | ICD-10-CM | POA: Diagnosis not present

## 2015-03-09 DIAGNOSIS — K219 Gastro-esophageal reflux disease without esophagitis: Secondary | ICD-10-CM | POA: Insufficient documentation

## 2015-03-09 DIAGNOSIS — Z91040 Latex allergy status: Secondary | ICD-10-CM | POA: Insufficient documentation

## 2015-03-09 DIAGNOSIS — Z87891 Personal history of nicotine dependence: Secondary | ICD-10-CM | POA: Diagnosis not present

## 2015-03-09 DIAGNOSIS — M79605 Pain in left leg: Secondary | ICD-10-CM | POA: Diagnosis present

## 2015-03-09 LAB — POC URINE PREG, ED: Preg Test, Ur: NEGATIVE

## 2015-03-09 MED ORDER — OXYCODONE-ACETAMINOPHEN 5-325 MG PO TABS: 1.0000 | ORAL_TABLET | ORAL | Status: DC | PRN

## 2015-03-09 MED ORDER — IBUPROFEN 600 MG PO TABS: 600.0000 mg | ORAL_TABLET | Freq: Four times a day (QID) | ORAL | Status: DC | PRN

## 2015-03-09 MED ORDER — CYCLOBENZAPRINE HCL 5 MG PO TABS: 5.0000 mg | ORAL_TABLET | Freq: Three times a day (TID) | ORAL | Status: DC | PRN

## 2015-03-09 MED ORDER — HYDROMORPHONE HCL 1 MG/ML IJ SOLN
1.0000 mg | Freq: Once | INTRAMUSCULAR | Status: AC
  Administered 2015-03-09: 1 mg via INTRAMUSCULAR
  Filled 2015-03-09: qty 1

## 2015-03-09 MED ORDER — ONDANSETRON 8 MG PO TBDP
8.0000 mg | ORAL_TABLET | Freq: Once | ORAL | Status: AC
  Administered 2015-03-09: 8 mg via ORAL
  Filled 2015-03-09: qty 1

## 2015-03-09 MED ORDER — HYDROCODONE-ACETAMINOPHEN 5-325 MG PO TABS
1.0000 | ORAL_TABLET | Freq: Once | ORAL | Status: AC
  Administered 2015-03-09: 1 via ORAL
  Filled 2015-03-09: qty 1

## 2015-03-09 NOTE — Discharge Instructions (Signed)
Sciatica °Sciatica is pain, weakness, numbness, or tingling along the path of the sciatic nerve. The nerve starts in the lower back and runs down the back of each leg. The nerve controls the muscles in the lower leg and in the back of the knee, while also providing sensation to the back of the thigh, lower leg, and the sole of your foot. Sciatica is a symptom of another medical condition. For instance, nerve damage or certain conditions, such as a herniated disk or bone spur on the spine, pinch or put pressure on the sciatic nerve. This causes the pain, weakness, or other sensations normally associated with sciatica. Generally, sciatica only affects one side of the body. °CAUSES  °· Herniated or slipped disc. °· Degenerative disk disease. °· A pain disorder involving the narrow muscle in the buttocks (piriformis syndrome). °· Pelvic injury or fracture. °· Pregnancy. °· Tumor (rare). °SYMPTOMS  °Symptoms can vary from mild to very severe. The symptoms usually travel from the low back to the buttocks and down the back of the leg. Symptoms can include: °· Mild tingling or dull aches in the lower back, leg, or hip. °· Numbness in the back of the calf or sole of the foot. °· Burning sensations in the lower back, leg, or hip. °· Sharp pains in the lower back, leg, or hip. °· Leg weakness. °· Severe back pain inhibiting movement. °These symptoms may get worse with coughing, sneezing, laughing, or prolonged sitting or standing. Also, being overweight may worsen symptoms. °DIAGNOSIS  °Your caregiver will perform a physical exam to look for common symptoms of sciatica. He or she may ask you to do certain movements or activities that would trigger sciatic nerve pain. Other tests may be performed to find the cause of the sciatica. These may include: °· Blood tests. °· X-rays. °· Imaging tests, such as an MRI or CT scan. °TREATMENT  °Treatment is directed at the cause of the sciatic pain. Sometimes, treatment is not necessary  and the pain and discomfort goes away on its own. If treatment is needed, your caregiver may suggest: °· Over-the-counter medicines to relieve pain. °· Prescription medicines, such as anti-inflammatory medicine, muscle relaxants, or narcotics. °· Applying heat or ice to the painful area. °· Steroid injections to lessen pain, irritation, and inflammation around the nerve. °· Reducing activity during periods of pain. °· Exercising and stretching to strengthen your abdomen and improve flexibility of your spine. Your caregiver may suggest losing weight if the extra weight makes the back pain worse. °· Physical therapy. °· Surgery to eliminate what is pressing or pinching the nerve, such as a bone spur or part of a herniated disk. °HOME CARE INSTRUCTIONS  °· Only take over-the-counter or prescription medicines for pain or discomfort as directed by your caregiver. °· Apply ice to the affected area for 20 minutes, 3-4 times a day for the first 48-72 hours. Then try heat in the same way. °· Exercise, stretch, or perform your usual activities if these do not aggravate your pain. °· Attend physical therapy sessions as directed by your caregiver. °· Keep all follow-up appointments as directed by your caregiver. °· Do not wear high heels or shoes that do not provide proper support. °· Check your mattress to see if it is too soft. A firm mattress may lessen your pain and discomfort. °SEEK IMMEDIATE MEDICAL CARE IF:  °· You lose control of your bowel or bladder (incontinence). °· You have increasing weakness in the lower back, pelvis, buttocks,   or legs.  You have redness or swelling of your back.  You have a burning sensation when you urinate.  You have pain that gets worse when you lie down or awakens you at night.  Your pain is worse than you have experienced in the past.  Your pain is lasting longer than 4 weeks.  You are suddenly losing weight without reason. MAKE SURE YOU:  Understand these  instructions.  Will watch your condition.  Will get help right away if you are not doing well or get worse.   This information is not intended to replace advice given to you by your health care provider. Make sure you discuss any questions you have with your health care provider.   Document Released: 01/04/2001 Document Revised: 10/01/2014 Document Reviewed: 05/22/2011 Elsevier Interactive Patient Education [employer].   Use the the other medicines as directed.  Do not drive within 4 hours of taking oxycodone as this will make you drowsy.  Avoid lifting,  Bending,  Twisting or any other activity that worsens your pain over the next week.  Apply a heating pad to your back 20 minutes 3-4 times daily.  You should get rechecked if your symptoms are not better over the next 5 days,  Or you develop increased pain,  Weakness in your leg(s) or loss of bladder or bowel function - these are symptoms of a worsening condition.

## 2015-03-09 NOTE — ED Notes (Signed)
Pt comes in with left leg pain that starts in her back and shoot down her left leg. Pt denies any injury, states it is difficult for her to walk. NAD noted. Pt has equal feeling in both legs.

## 2015-03-09 NOTE — ED Notes (Signed)
Pt denies injury, had difficulty moving from wheelchair to stretcher, due to pain

## 2015-03-09 NOTE — ED Notes (Signed)
Pt more comfortable sitting in wheelchair.  

## 2015-03-09 NOTE — ED Notes (Signed)
Patient given discharge instruction, verbalized understand. Patient wheelchair out of the department.  

## 2015-03-09 NOTE — ED Provider Notes (Signed)
CSN: [id number]     Arrival date & time 03/09/15  1701 History  By signing my name below, I, Robin Martin, attest that this documentation has been prepared under the direction and in the presence of Robin Jefferson, PA-C.  Electronically Signed: Hilda Martin, ED Scribe. 03/09/2015. 7:52 PM.    Chief Complaint  Patient presents with  . Leg Pain     Patient is a 35 y.o. female presenting with leg pain. The history is provided by the patient. No language interpreter was used.  Leg Pain Location:  Leg Injury: no   Leg location:  L leg, L upper leg and L lower leg Pain details:    Quality:  Sharp   Radiates to:  Back and L leg   Severity:  Moderate   Onset quality:  Gradual   Duration:  1 day   Timing:  Constant   Progression:  Worsening Chronicity:  Recurrent Dislocation: no   Foreign body present:  No foreign bodies Relieved by:  Immobilization Worsened by:  Exercise and activity Associated symptoms: back pain, muscle weakness and stiffness   Associated symptoms: no fever   Risk factors: obesity    HPI Comments: Robin Martin is a 35 y.o. female with a hx of sciatica who presents to the Emergency Department complaining of constant, worsening left leg pain that starts in her lower back and radiates down past her left knee. Pt states she woke up yesterday and began having pain, and states she got up this morning and nearly collapsed secondary to pain. Pt denies any known injury to her back or leg, and reports it is very difficult for her to walk. She denies weakness or numbness in her extremities and has no urinary or fecal incontinence or retention.  She has found no alleviators for her pain.    Past Medical History  Diagnosis Date  . Hypertension   . Herpes   . Pregnancy induced hypertension   . Abnormal Pap smear   . Depression     post-partum depression  . Bipolar 1 disorder ([place])   . Heartburn in pregnancy   . GERD (gastroesophageal reflux disease)     h/o GERD- wt loss  alleviated it  . Infection     urinary tract infection  . Chronic kidney disease     kidney stone at age 20  . Ovarian cyst     age 89  . Cancer ([place]) 2005    LEEP-cervical cancer  . Gestational diabetes mellitus, antepartum    Past Surgical History  Procedure Laterality Date  . Leep    . Cesarean section with bilateral tubal ligation Bilateral 04/09/2012    BTL not done  . Cesarean section      x2--2005 and 2014  . Cesarean section with bilateral tubal ligation Bilateral 11/06/2014    Procedure: REPEAT CESAREAN SECTION WITH BILATERAL TUBAL LIGATION REMOVAL OF SKIN TAG;  Surgeon: Crawford Givens, MD;  Location: [facility];  Service: Obstetrics;  Laterality: Bilateral;   Family History  Problem Relation Age of Onset  . Diabetes Mother   . Hypertension Mother   . Mental illness Mother   . Diabetes Father   . Hypertension Father   . Asthma Sister   . Diabetes Sister   . Diabetes Maternal Grandmother    Social History  Substance Use Topics  . Smoking status: Former Smoker -- 0.50 packs/day    Types: Cigarettes    Quit date: 03/22/2012  . Smokeless tobacco: Never Used  .  Alcohol Use: No   OB History    Gravida Para Term Preterm AB TAB SAB Ectopic Multiple Living   5 4 3 1 1  0 1 0 0 4     Review of Systems  Constitutional: Negative for fever.  Respiratory: Negative for shortness of breath.   Cardiovascular: Negative for chest pain and leg swelling.  Gastrointestinal: Negative for abdominal pain, constipation and abdominal distention.  Genitourinary: Negative for dysuria, urgency, frequency, flank pain and difficulty urinating.  Musculoskeletal: Positive for back pain and stiffness. Negative for joint swelling and gait problem.  Skin: Negative for rash.  Neurological: Negative for weakness and numbness.      Allergies  Chocolate; Latex; and Morphine and related  Home Medications   Prior to Admission medications   Medication Sig Start Date End Date Taking? Authorizing  Provider  cyclobenzaprine (FLEXERIL) 5 MG tablet Take 1 tablet (5 mg total) by mouth 3 (three) times daily as needed for muscle spasms. 03/09/15   Robin Jefferson, PA-C  ferrous sulfate 325 (65 FE) MG tablet Take 1 tablet (325 mg total) by mouth daily with breakfast. 11/09/14   Donnel Saxon, CNM  glyBURIDE (DIABETA) 5 MG tablet Take 1 tablet (5 mg total) by mouth at bedtime. 11/09/14   Donnel Saxon, CNM  hydrochlorothiazide (HYDRODIURIL) 25 MG tablet Take 1 tablet (25 mg total) by mouth daily. Take once a day for 7 days for edema. 11/18/14   Gavin Pound, CNM  ibuprofen (ADVIL,MOTRIN) 600 MG tablet Take 1 tablet (600 mg total) by mouth every 6 (six) hours as needed. 03/09/15   Robin Jefferson, PA-C  labetalol (NORMODYNE) 300 MG tablet Take 1 tablet (300 mg total) by mouth 2 (two) times daily. 11/18/14   Gavin Pound, CNM  oxyCODONE-acetaminophen (PERCOCET/ROXICET) 5-325 MG tablet Take 1 tablet by mouth every 4 (four) hours as needed. 03/09/15   Robin Jefferson, PA-C  pantoprazole (PROTONIX) 40 MG tablet Take 40 mg by mouth daily.    Historical Provider, MD   BP 159/92 mmHg  Pulse 77  Temp(Src) 98.2 F (36.8 C) (Oral)  Resp 14  Ht 5\' 7"  (1.702 m)  Wt 131.543 kg  BMI 45.41 kg/m2  SpO2 99%  LMP  Physical Exam  Constitutional: She appears well-developed and well-nourished.  HENT:  Head: Normocephalic.  Eyes: Conjunctivae are normal.  Neck: Normal range of motion. Neck supple.  Cardiovascular: Normal rate and intact distal pulses.   Pedal pulses normal.  Pulmonary/Chest: Effort normal.  Abdominal: Soft. Bowel sounds are normal. She exhibits no distension and no mass.  Musculoskeletal: Normal range of motion. She exhibits no edema.       Lumbar back: She exhibits tenderness. She exhibits no swelling, no edema and no spasm.  Neurological: She is alert. She has normal strength. She displays no atrophy and no tremor. No sensory deficit. Gait normal.  Reflex Scores:      Patellar reflexes are 2+ on the right  side and 2+ on the left side.      Achilles reflexes are 2+ on the right side and 2+ on the left side. No strength deficit noted in hip and knee flexor and extensor muscle groups.  Ankle flexion and extension intact.  Skin: Skin is warm and dry.  Psychiatric: She has a normal mood and affect.  Nursing note and vitals reviewed.   ED Course  Procedures (including critical care time)  DIAGNOSTIC STUDIES: Oxygen Saturation is 99% on room air, normal by my interpretation.    COORDINATION OF  CARE: 7:49 PM Discussed treatment plan with pt at bedside and pt agreed to plan.   Labs Review Labs Reviewed  POC URINE PREG, ED    Imaging Review Dg Lumbar Spine Complete  03/09/2015  CLINICAL DATA:  Left lumbar spine pain radiating to left hip and leg for 2 days with no known injury EXAM: LUMBAR SPINE - COMPLETE 4+ VIEW COMPARISON:  None. FINDINGS: Mild to moderate levoscoliosis lumbar spine. Normal anterior-posterior alignment. Mild degenerative disc disease at L1-2, L2-3, and L3-4. No fracture. IMPRESSION: Scoliosis with degenerative changes. Electronically Signed   By: Skipper Cliche M.D.   On: 03/09/2015 20:29   I have personally reviewed and evaluated these images and lab results as part of my medical decision-making.   EKG Interpretation None      MDM   Final diagnoses:  Sciatica of left side  Scoliosis of lumbar spine    Imaging reviewed and discussed with pt who was not aware of scoliosis despite prior imaging including mri.  She has been evaluated by neurosurgery over 1 year ago (she can't remember MD name, but [facility]).  Was told not a surgical candidate but was diagnosed with ddd.  Pt was given dilaudid IM while here with improvement in pain.  No neuro deficit on exam or by history to suggest emergent or surgical presentation.  Discussed worsened sx that should prompt immediate re-evaluation including distal weakness, bowel/bladder retention/incontinence. Advised  activity as tolerated/ice/heat tx.  Prescribed oxycodone, flexeril, ibuprofen. Prn f/u if not improved over the next week.    I personally performed the services described in this documentation, which was scribed in my presence. The recorded information has been reviewed and is accurate.       Robin Jefferson, PA-C 03/11/15 1423  Milton Ferguson, MD 03/11/15 [phone]

## 2015-03-18 ENCOUNTER — Other Ambulatory Visit: Payer: Self-pay | Admitting: Obstetrics and Gynecology

## 2015-04-07 ENCOUNTER — Other Ambulatory Visit: Payer: Self-pay | Admitting: Obstetrics and Gynecology

## 2015-04-22 ENCOUNTER — Inpatient Hospital Stay (HOSPITAL_COMMUNITY): Admission: RE | Admit: 2015-04-22 | Payer: Self-pay | Source: Ambulatory Visit

## 2015-04-22 ENCOUNTER — Other Ambulatory Visit (HOSPITAL_COMMUNITY): Payer: Self-pay | Admitting: Obstetrics and Gynecology

## 2015-04-22 ENCOUNTER — Encounter (HOSPITAL_COMMUNITY): Payer: Self-pay

## 2015-04-22 ENCOUNTER — Encounter (HOSPITAL_COMMUNITY)
Admission: RE | Admit: 2015-04-22 | Discharge: 2015-04-22 | Disposition: A | Discharge status: Home / Self Care | Payer: Medicaid Other | Source: Ambulatory Visit | Attending: Obstetrics and Gynecology | Admitting: Obstetrics and Gynecology

## 2015-04-22 DIAGNOSIS — Z01812 Encounter for preprocedural laboratory examination: Secondary | ICD-10-CM | POA: Diagnosis present

## 2015-04-22 HISTORY — DX: Sciatica, unspecified side: M54.30

## 2015-04-22 HISTORY — DX: Other intervertebral disc degeneration, lumbar region without mention of lumbar back pain or lower extremity pain: M51.369

## 2015-04-22 HISTORY — DX: Other intervertebral disc degeneration, lumbar region: M51.36

## 2015-04-22 HISTORY — DX: Scoliosis, unspecified: M41.9

## 2015-04-22 HISTORY — DX: Enthesopathy, unspecified: M77.9

## 2015-04-22 LAB — CBC
HCT: 38.6 % (ref 36.0–46.0)
Hemoglobin: 13.2 g/dL (ref 12.0–15.0)
MCH: 28.6 pg (ref 26.0–34.0)
MCHC: 34.2 g/dL (ref 30.0–36.0)
MCV: 83.5 fL (ref 78.0–100.0)
Platelets: 229 10*3/uL (ref 150–400)
RBC: 4.62 MIL/uL (ref 3.87–5.11)
RDW: 13.7 % (ref 11.5–15.5)
WBC: 12 10*3/uL — ABNORMAL HIGH (ref 4.0–10.5)

## 2015-04-22 LAB — BASIC METABOLIC PANEL
Anion gap: 8 (ref 5–15)
BUN: 11 mg/dL (ref 6–20)
CO2: 28 mmol/L (ref 22–32)
Calcium: 8.6 mg/dL — ABNORMAL LOW (ref 8.9–10.3)
Chloride: 104 mmol/L (ref 101–111)
Creatinine, Ser: 0.62 mg/dL (ref 0.44–1.00)
GFR calc Af Amer: >60 mL/min (ref >60)
GFR calc non Af Amer: >60 mL/min (ref >60)
Glucose, Bld: 179 mg/dL — ABNORMAL HIGH (ref 65–99)
Potassium: 3.9 mmol/L (ref 3.5–5.1)
Sodium: 140 mmol/L (ref 135–145)

## 2015-04-22 NOTE — H&P (Signed)
Robin Martin is a 35 y.o.  female,  P: 3-1-1-4  who presents for hysterectomy because of dysfunctional uterine bleeding.  The patient delivered by C-section in October 2016.  Following that delivery she has bled daily,  with the exception of the past 2 weeks when she began norethindrone 5 mg daily.  Her flow would range for heavy to light requiring the change of a super plus tampon every 2 hours to only a single pad  that would last all day.  She has had a negative gonorrhea and chlamydia culture during this prolong bleeding work up  and her TSH has been normal.    A pelvic ultrasound, done December 2016 showed a uterus:  11.8 x 5.4 x 6.1 cm, endometrium-0.84 cm with no visualization of either ovary.  She admits to deep dyspareunia and post coital achiness that will last for 2 days.  She has also noticed that if her bleeding is merely spotting and she has intercourse,  she will have gushes of blood to follow.  She has occasional diarrhea (diet related) and cramping that is relieved with Ibuprofen 800 mg,   but no problems with bladder function.  An endometrial biospy done in February 2017 returned-benign proliferative phase endometrium with no hyperplasia or malignancy.     The patient was tried on combination oral contraceptives for management of her bleeding but had no positive results.  Fortunately,  she has gradually had some relief from her bleeding with norethindrone.   In spite of this,  the patient has decided to proceed with definitive therapy, in the form of hysterectomy,  after a review of both  medical and surgical management options.   Past Medical History  OB History: G: 5;  P: 3-1-1-4:   SVB 2002  (Infant 8lbs.)   C-section: 2005, 2014 and 2016  GYN History: menarche: 35 YO    LMP: see HPI    Contracepton bilateral tubal ligation  The patient reports a past history of: herpes and HPV.  Has a  history of CIN III in 2006, treated with LEEP and have had normal PAP smears since;   Last PAP  smear: 2016-normal  Medical History: Bipolar Disorder,  Hypertension, GERD, Degenerative Disc Disease, Gestational Diabetes, Post Partum Depression, Renal Stone, Bilateral Sciatica, Bilateral Ankle Fractures and Bilateral Forearm Fractures   Surgical History:   2016 Tubal Sterilzation  and 2006  Loop Electrosurgical Excision Procedure Denies problems with anesthesia or history of blood transfusions  Family History:  Diabetes Mellitus, Hypertension, Cardiovascular Disease, Asthma, Depression, Alzheimers, Stroke, Bipolar Disorder and Renal Disease   Social History:   Single with a Soil scientist;  Ship broker;  Denies tobacco or alcohol use   Medications:  Flexeril 10 mg  tid prn Motrin 200 mg  4 po with food every 8 hours prn  Allergies  Allergen Reactions  . Chocolate Other (See Comments)    Thins blood  . Latex Rash  . Morphine And Related Itching, Nausea And Vomiting and Rash     Denies sensitivity to peanuts, shellfish, soy, or adhesives.   ROS: Admits to glasses/contact lenses, occasional diarrhea  and random lightheadedness;  but denies headache, vision changes, nasal congestion, dysphagia, tinnitus,  hoarseness, cough,  chest pain, shortness of breath, nausea, vomiting, constipation,  urinary frequency, urgency  dysuria, hematuria, vaginitis symptoms, pelvic pain, swelling of joints,easy bruising,  myalgias, arthralgias, skin rashes, unexplained weight loss and except as is mentioned in the history of present illness, patient's review of systems is  otherwise negative.  Physical Exam  Bp: 126/78       P: 92       R 20  Temperature: 98.8 degrees F orally    Weight: 04 lbs.  Height: 5\' 7"    BMI: 47.6  Neck: supple without masses or thyromegaly Lungs: clear to auscultation Heart: regular rate and rhythm Abdomen: soft, non-tender and no organomegaly Pelvic:EGBUS- wnl; vagina-normal rugae; uterus-normal size, cervix without lesions or motion tenderness; adnexae-no tenderness or  masses Extremities:  no clubbing, cyanosis or edema   Assesment:  Dysfunctional Uterine Bleeding            History of Severe Cervical Dysplasia            S/P Tubal Sterilization  Disposition:  A discussion was held with patient regarding the indication for her procedure(s) along with the risks, to include, but not limited to: reaction to anesthesia, damage to adjacent organs (including bladder/.bowel) , infection and excessive bleeding. The patient verbalized understanding of these risks and has consented to proceed with a Total Laparoscopic Hysterctomy Bilateral Salpingectomy, Cystoscopy and Possible Laparoscopically Assisted Vaginal Hysterectomy and possible Total Abdominal Hysterectomy at [place] on April 24, 2015.    CSN# [id number]   Aysha Livecchi J. Florene Glen, PA-C  for Dr. Harvie Bridge. Mancel Bale

## 2015-04-22 NOTE — Patient Instructions (Signed)
Your procedure is scheduled on:  Friday, April 24, 2015  Enter through the Main Entrance of [facility] at:  9:00 AM  Pick up the phone at the desk and dial [phone].  Call this number if you have problems the morning of surgery: [phone].  Remember:  Do NOT eat food or drink after:  Midnight Thursday  Take these medicines the morning of surgery with a SIP OF WATER:  Zantac  Do NOT wear jewelry (body piercing), metal hair clips/bobby pins, make-up, or nail polish. Do NOT wear lotions, powders, or perfumes.  You may wear deodorant. Do NOT shave for 48 hours prior to surgery. Do NOT bring valuables to the hospital. Contacts, dentures, or bridgework may not be worn into surgery.  Leave suitcase in car.  After surgery it may be brought to your room.  For patients admitted to the hospital, checkout time is 11:00 AM the day of discharge.

## 2015-04-23 MED ORDER — DEXTROSE 5 % IV SOLN
3.0000 g | INTRAVENOUS | Status: AC
  Administered 2015-04-24: 3 g via INTRAVENOUS
  Filled 2015-04-23: qty 30

## 2015-04-24 ENCOUNTER — Ambulatory Visit (HOSPITAL_COMMUNITY)
Admission: RE | Admit: 2015-04-24 | Payer: BLUE CROSS/BLUE SHIELD | Source: Ambulatory Visit | Admitting: Obstetrics and Gynecology

## 2015-04-24 ENCOUNTER — Encounter (HOSPITAL_COMMUNITY)
Admission: RE | Disposition: A | Discharge status: Home / Self Care | Payer: Self-pay | Source: Ambulatory Visit | Attending: Obstetrics and Gynecology

## 2015-04-24 ENCOUNTER — Encounter (HOSPITAL_COMMUNITY): Admission: RE | Payer: Self-pay | Source: Ambulatory Visit

## 2015-04-24 ENCOUNTER — Encounter (HOSPITAL_COMMUNITY): Payer: Self-pay | Admitting: Anesthesiology

## 2015-04-24 ENCOUNTER — Ambulatory Visit (HOSPITAL_COMMUNITY): Payer: Medicaid Other | Admitting: Anesthesiology

## 2015-04-24 ENCOUNTER — Observation Stay (HOSPITAL_COMMUNITY)
Admission: RE | Admit: 2015-04-24 | Discharge: 2015-04-25 | Disposition: A | Discharge status: Home / Self Care | Payer: Medicaid Other | Source: Ambulatory Visit | Attending: Obstetrics and Gynecology | Admitting: Obstetrics and Gynecology

## 2015-04-24 DIAGNOSIS — Z87891 Personal history of nicotine dependence: Secondary | ICD-10-CM | POA: Insufficient documentation

## 2015-04-24 DIAGNOSIS — Z87442 Personal history of urinary calculi: Secondary | ICD-10-CM | POA: Insufficient documentation

## 2015-04-24 DIAGNOSIS — Z91040 Latex allergy status: Secondary | ICD-10-CM | POA: Diagnosis not present

## 2015-04-24 DIAGNOSIS — F319 Bipolar disorder, unspecified: Secondary | ICD-10-CM | POA: Insufficient documentation

## 2015-04-24 DIAGNOSIS — N72 Inflammatory disease of cervix uteri: Secondary | ICD-10-CM | POA: Insufficient documentation

## 2015-04-24 DIAGNOSIS — Z9851 Tubal ligation status: Secondary | ICD-10-CM | POA: Diagnosis not present

## 2015-04-24 DIAGNOSIS — N938 Other specified abnormal uterine and vaginal bleeding: Secondary | ICD-10-CM | POA: Diagnosis present

## 2015-04-24 DIAGNOSIS — Z91018 Allergy to other foods: Secondary | ICD-10-CM | POA: Diagnosis not present

## 2015-04-24 DIAGNOSIS — Z79899 Other long term (current) drug therapy: Secondary | ICD-10-CM | POA: Diagnosis not present

## 2015-04-24 DIAGNOSIS — K219 Gastro-esophageal reflux disease without esophagitis: Secondary | ICD-10-CM | POA: Diagnosis not present

## 2015-04-24 DIAGNOSIS — I10 Essential (primary) hypertension: Secondary | ICD-10-CM | POA: Insufficient documentation

## 2015-04-24 DIAGNOSIS — Z885 Allergy status to narcotic agent status: Secondary | ICD-10-CM | POA: Diagnosis not present

## 2015-04-24 HISTORY — PX: LAPAROSCOPIC HYSTERECTOMY: SHX1926

## 2015-04-24 HISTORY — PX: CYSTOSCOPY: SHX5120

## 2015-04-24 LAB — PREGNANCY, URINE: Preg Test, Ur: NEGATIVE

## 2015-04-24 SURGERY — HYSTERECTOMY, TOTAL, LAPAROSCOPIC
Anesthesia: General

## 2015-04-24 MED ORDER — NEOSTIGMINE METHYLSULFATE 10 MG/10ML IV SOLN
INTRAVENOUS | Status: AC
  Filled 2015-04-24: qty 1

## 2015-04-24 MED ORDER — STERILE WATER FOR IRRIGATION IR SOLN
Status: DC | PRN
Start: 2015-04-24 — End: 2015-04-24
  Administered 2015-04-24 (×2): 1000 mL

## 2015-04-24 MED ORDER — ROCURONIUM BROMIDE 100 MG/10ML IV SOLN
INTRAVENOUS | Status: DC | PRN
  Administered 2015-04-24 (×2): 10 mg via INTRAVENOUS
  Administered 2015-04-24: 40 mg via INTRAVENOUS
  Administered 2015-04-24: 10 mg via INTRAVENOUS
  Administered 2015-04-24: 20 mg via INTRAVENOUS
  Administered 2015-04-24 (×2): 10 mg via INTRAVENOUS
  Administered 2015-04-24 (×2): 20 mg via INTRAVENOUS
  Administered 2015-04-24: 10 mg via INTRAVENOUS

## 2015-04-24 MED ORDER — SCOPOLAMINE 1 MG/3DAYS TD PT72
1.0000 | MEDICATED_PATCH | Freq: Once | TRANSDERMAL | Status: DC
  Administered 2015-04-24: 1.5 mg via TRANSDERMAL

## 2015-04-24 MED ORDER — ROCURONIUM BROMIDE 100 MG/10ML IV SOLN
INTRAVENOUS | Status: AC
  Filled 2015-04-24: qty 1

## 2015-04-24 MED ORDER — HYDROMORPHONE HCL 1 MG/ML IJ SOLN
INTRAMUSCULAR | Status: AC
  Filled 2015-04-24: qty 1

## 2015-04-24 MED ORDER — NEOSTIGMINE METHYLSULFATE 10 MG/10ML IV SOLN
INTRAVENOUS | Status: DC | PRN
  Administered 2015-04-24: 5 mg via INTRAVENOUS

## 2015-04-24 MED ORDER — HYDROMORPHONE HCL 1 MG/ML IJ SOLN
INTRAMUSCULAR | Status: DC | PRN
  Administered 2015-04-24: 1 mg via INTRAVENOUS

## 2015-04-24 MED ORDER — KETOROLAC TROMETHAMINE 30 MG/ML IJ SOLN
INTRAMUSCULAR | Status: DC | PRN
  Administered 2015-04-24: 30 mg via INTRAVENOUS

## 2015-04-24 MED ORDER — DIPHENHYDRAMINE HCL 12.5 MG/5ML PO ELIX: 12.5000 mg | ORAL_SOLUTION | Freq: Four times a day (QID) | ORAL | Status: DC | PRN

## 2015-04-24 MED ORDER — MIDAZOLAM HCL 2 MG/2ML IJ SOLN
INTRAMUSCULAR | Status: AC
  Filled 2015-04-24: qty 2

## 2015-04-24 MED ORDER — PHENYLEPHRINE HCL 10 MG/ML IJ SOLN
INTRAMUSCULAR | Status: DC | PRN
  Administered 2015-04-24: 40 ug via INTRAVENOUS
  Administered 2015-04-24 (×2): 80 ug via INTRAVENOUS
  Administered 2015-04-24: 40 ug via INTRAVENOUS
  Administered 2015-04-24 (×3): 80 ug via INTRAVENOUS

## 2015-04-24 MED ORDER — LACTATED RINGERS IR SOLN
Status: DC | PRN
  Administered 2015-04-24 (×2): 3000 mL

## 2015-04-24 MED ORDER — MIDAZOLAM HCL 2 MG/2ML IJ SOLN
INTRAMUSCULAR | Status: DC | PRN
  Administered 2015-04-24: 2 mg via INTRAVENOUS

## 2015-04-24 MED ORDER — PROPOFOL 10 MG/ML IV BOLUS
INTRAVENOUS | Status: AC
  Filled 2015-04-24: qty 20

## 2015-04-24 MED ORDER — OXYCODONE-ACETAMINOPHEN 5-325 MG PO TABS
1.0000 | ORAL_TABLET | ORAL | Status: DC | PRN
  Administered 2015-04-25: 2 via ORAL
  Filled 2015-04-24: qty 2

## 2015-04-24 MED ORDER — FENTANYL CITRATE (PF) 250 MCG/5ML IJ SOLN
INTRAMUSCULAR | Status: AC
  Filled 2015-04-24: qty 5

## 2015-04-24 MED ORDER — METHYLENE BLUE 0.5 % INJ SOLN
INTRAVENOUS | Status: DC | PRN
  Administered 2015-04-24: 1 mL

## 2015-04-24 MED ORDER — LACTATED RINGERS IV SOLN
INTRAVENOUS | Status: DC
  Administered 2015-04-24: 125 mL/h via INTRAVENOUS
  Administered 2015-04-24 (×3): via INTRAVENOUS

## 2015-04-24 MED ORDER — DEXAMETHASONE SODIUM PHOSPHATE 10 MG/ML IJ SOLN
INTRAMUSCULAR | Status: AC
  Filled 2015-04-24: qty 1

## 2015-04-24 MED ORDER — MENTHOL 3 MG MT LOZG: 1.0000 | LOZENGE | OROMUCOSAL | Status: DC | PRN

## 2015-04-24 MED ORDER — KETOROLAC TROMETHAMINE 30 MG/ML IJ SOLN
30.0000 mg | Freq: Four times a day (QID) | INTRAMUSCULAR | Status: DC
  Administered 2015-04-24 – 2015-04-25 (×2): 30 mg via INTRAVENOUS
  Filled 2015-04-24 (×2): qty 1

## 2015-04-24 MED ORDER — HYDROMORPHONE HCL 1 MG/ML IJ SOLN: 0.2500 mg | INTRAMUSCULAR | Status: DC | PRN

## 2015-04-24 MED ORDER — ONDANSETRON HCL 4 MG PO TABS: 4.0000 mg | ORAL_TABLET | Freq: Three times a day (TID) | ORAL | Status: DC | PRN

## 2015-04-24 MED ORDER — PHENYLEPHRINE 40 MCG/ML (10ML) SYRINGE FOR IV PUSH (FOR BLOOD PRESSURE SUPPORT)
PREFILLED_SYRINGE | INTRAVENOUS | Status: AC
  Filled 2015-04-24: qty 10

## 2015-04-24 MED ORDER — LIDOCAINE HCL (CARDIAC) 20 MG/ML IV SOLN
INTRAVENOUS | Status: DC | PRN
  Administered 2015-04-24: 80 mg via INTRAVENOUS

## 2015-04-24 MED ORDER — ONDANSETRON HCL 4 MG/2ML IJ SOLN: 4.0000 mg | Freq: Once | INTRAMUSCULAR | Status: DC | PRN

## 2015-04-24 MED ORDER — ONDANSETRON HCL 4 MG/2ML IJ SOLN
INTRAMUSCULAR | Status: AC
  Filled 2015-04-24: qty 2

## 2015-04-24 MED ORDER — BUPIVACAINE HCL (PF) 0.25 % IJ SOLN
INTRAMUSCULAR | Status: DC | PRN
  Administered 2015-04-24: 24 mL

## 2015-04-24 MED ORDER — MEPERIDINE HCL 25 MG/ML IJ SOLN: 6.2500 mg | INTRAMUSCULAR | Status: DC | PRN

## 2015-04-24 MED ORDER — LIDOCAINE HCL (CARDIAC) 20 MG/ML IV SOLN
INTRAVENOUS | Status: AC
  Filled 2015-04-24: qty 5

## 2015-04-24 MED ORDER — DIPHENHYDRAMINE HCL 50 MG/ML IJ SOLN: 12.5000 mg | Freq: Four times a day (QID) | INTRAMUSCULAR | Status: DC | PRN

## 2015-04-24 MED ORDER — LACTATED RINGERS IV SOLN
INTRAVENOUS | Status: DC
  Administered 2015-04-24 – 2015-04-25 (×2): via INTRAVENOUS

## 2015-04-24 MED ORDER — FENTANYL CITRATE (PF) 250 MCG/5ML IJ SOLN
INTRAMUSCULAR | Status: DC | PRN
  Administered 2015-04-24: 100 ug via INTRAVENOUS
  Administered 2015-04-24: 50 ug via INTRAVENOUS
  Administered 2015-04-24 (×3): 100 ug via INTRAVENOUS
  Administered 2015-04-24 (×4): 50 ug via INTRAVENOUS

## 2015-04-24 MED ORDER — METHYLENE BLUE 1 % INJ SOLN
INTRAMUSCULAR | Status: AC
Start: 2015-04-24 — End: 2015-04-24
  Filled 2015-04-24: qty 1

## 2015-04-24 MED ORDER — PROPOFOL 10 MG/ML IV BOLUS
INTRAVENOUS | Status: DC | PRN
  Administered 2015-04-24: 200 mg via INTRAVENOUS

## 2015-04-24 MED ORDER — DOCUSATE SODIUM 100 MG PO CAPS
100.0000 mg | ORAL_CAPSULE | Freq: Two times a day (BID) | ORAL | Status: DC
Start: 2015-04-25 — End: 2015-04-25
  Administered 2015-04-25: 100 mg via ORAL
  Filled 2015-04-24: qty 1

## 2015-04-24 MED ORDER — SCOPOLAMINE 1 MG/3DAYS TD PT72
MEDICATED_PATCH | TRANSDERMAL | Status: AC
  Administered 2015-04-24: 1.5 mg via TRANSDERMAL
  Filled 2015-04-24: qty 1

## 2015-04-24 MED ORDER — NALOXONE HCL 0.4 MG/ML IJ SOLN: 0.4000 mg | INTRAMUSCULAR | Status: DC | PRN

## 2015-04-24 MED ORDER — SODIUM CHLORIDE 0.9% FLUSH: 9.0000 mL | INTRAVENOUS | Status: DC | PRN

## 2015-04-24 MED ORDER — IBUPROFEN 600 MG PO TABS: 600.0000 mg | ORAL_TABLET | Freq: Four times a day (QID) | ORAL | Status: DC | PRN

## 2015-04-24 MED ORDER — HYDROMORPHONE 1 MG/ML IV SOLN
INTRAVENOUS | Status: DC
  Administered 2015-04-24: 0.9 mg via INTRAVENOUS
  Administered 2015-04-24: 19:00:00 via INTRAVENOUS
  Administered 2015-04-25: 1.8 mg via INTRAVENOUS
  Administered 2015-04-25: 1.2 mg via INTRAVENOUS
  Filled 2015-04-24: qty 25

## 2015-04-24 MED ORDER — GLYCOPYRROLATE 0.2 MG/ML IJ SOLN
INTRAMUSCULAR | Status: DC | PRN
  Administered 2015-04-24: 0.1 mg via INTRAVENOUS
  Administered 2015-04-24: .8 mg via INTRAVENOUS

## 2015-04-24 MED ORDER — ONDANSETRON HCL 4 MG/2ML IJ SOLN: 4.0000 mg | Freq: Four times a day (QID) | INTRAMUSCULAR | Status: DC | PRN

## 2015-04-24 MED ORDER — ONDANSETRON HCL 4 MG/2ML IJ SOLN
INTRAMUSCULAR | Status: DC | PRN
  Administered 2015-04-24: 4 mg via INTRAVENOUS

## 2015-04-24 SURGICAL SUPPLY — 60 items
BAG URINE DRAINAGE (UROLOGICAL SUPPLIES) ×3 IMPLANT
BARRIER ADHS 3X4 INTERCEED (GAUZE/BANDAGES/DRESSINGS) IMPLANT
CLOTH BEACON ORANGE TIMEOUT ST (SAFETY) ×3 IMPLANT
COVER LIGHT HANDLE  1/PK (MISCELLANEOUS) ×1
COVER LIGHT HANDLE 1/PK (MISCELLANEOUS) ×2 IMPLANT
COVER MAYO STAND STRL (DRAPES) ×3 IMPLANT
DECANTER SPIKE VIAL GLASS SM (MISCELLANEOUS) ×3 IMPLANT
DEFOGGER SCOPE WARMER CLEARIFY (MISCELLANEOUS) ×3 IMPLANT
DISSECTOR BLUNT TIP ENDO 5MM (MISCELLANEOUS) IMPLANT
DRSG OPSITE POSTOP 3X4 (GAUZE/BANDAGES/DRESSINGS) ×3 IMPLANT
DURAPREP 26ML APPLICATOR (WOUND CARE) ×3 IMPLANT
EVACUATOR SMOKE 8.L (FILTER) ×6 IMPLANT
GLOVE BIO SURGEON STRL SZ7.5 (GLOVE) ×3 IMPLANT
GLOVE BIOGEL PI IND STRL 6 (GLOVE) ×6 IMPLANT
GLOVE BIOGEL PI IND STRL 6.5 (GLOVE) ×8 IMPLANT
GLOVE BIOGEL PI IND STRL 7.0 (GLOVE) ×6 IMPLANT
GLOVE BIOGEL PI IND STRL 7.5 (GLOVE) ×2 IMPLANT
GLOVE BIOGEL PI INDICATOR 6 (GLOVE) ×3
GLOVE BIOGEL PI INDICATOR 6.5 (GLOVE) ×4
GLOVE BIOGEL PI INDICATOR 7.0 (GLOVE) ×3
GLOVE BIOGEL PI INDICATOR 7.5 (GLOVE) ×1
GOWN STRL REUS W/TWL LRG LVL3 (GOWN DISPOSABLE) ×21 IMPLANT
HEMOSTAT SURGICEL 2X14 (HEMOSTASIS) ×3 IMPLANT
LIQUID BAND (GAUZE/BANDAGES/DRESSINGS) ×3 IMPLANT
NEEDLE INSUFFLATION 120MM (ENDOMECHANICALS) ×3 IMPLANT
NEEDLE INSUFFLATION 150MM (ENDOMECHANICALS) ×3 IMPLANT
NS IRRIG 1000ML POUR BTL (IV SOLUTION) ×3 IMPLANT
OCCLUDER COLPOPNEUMO (BALLOONS) ×3 IMPLANT
PACK LAPAROSCOPY BASIN (CUSTOM PROCEDURE TRAY) ×3 IMPLANT
PAD TRENDELENBURG POSITION (MISCELLANEOUS) ×3 IMPLANT
POUCH LAPAROSCOPIC INSTRUMENT (MISCELLANEOUS) ×3 IMPLANT
SCISSORS LAP 5X35 DISP (ENDOMECHANICALS) IMPLANT
SET CYSTO W/LG BORE CLAMP LF (SET/KITS/TRAYS/PACK) ×9 IMPLANT
SET IRRIG TUBING LAPAROSCOPIC (IRRIGATION / IRRIGATOR) ×3 IMPLANT
SET TRI-LUMEN FLTR TB AIRSEAL (TUBING) ×6 IMPLANT
SHEARS HARMONIC ACE PLUS 36CM (ENDOMECHANICALS) ×6 IMPLANT
SLEEVE XCEL OPT CAN 5 100 (ENDOMECHANICALS) ×3 IMPLANT
SOLUTION ELECTROLUBE (MISCELLANEOUS) IMPLANT
SPONGE SURGIFOAM ABS GEL 12-7 (HEMOSTASIS) ×3 IMPLANT
SUT MNCRL AB 3-0 PS2 27 (SUTURE) ×6 IMPLANT
SUT PDS AB 1 CT1 36 (SUTURE) ×12 IMPLANT
SUT VICRYL 0 TIES 12 18 (SUTURE) IMPLANT
SUT VICRYL 0 UR6 27IN ABS (SUTURE) ×9 IMPLANT
SYR 50ML LL SCALE MARK (SYRINGE) ×3 IMPLANT
SYR TB 1ML 25GX5/8 (SYRINGE) ×3 IMPLANT
SYRINGE 10CC LL (SYRINGE) ×3 IMPLANT
TIP UTERINE 5.1X6CM LAV DISP (MISCELLANEOUS) IMPLANT
TIP UTERINE 6.7X10CM GRN DISP (MISCELLANEOUS) ×3 IMPLANT
TIP UTERINE 6.7X6CM WHT DISP (MISCELLANEOUS) IMPLANT
TIP UTERINE 6.7X8CM BLUE DISP (MISCELLANEOUS) IMPLANT
TOWEL OR 17X24 6PK STRL BLUE (TOWEL DISPOSABLE) ×6 IMPLANT
TRAY FOLEY CATH SILVER 14FR (SET/KITS/TRAYS/PACK) ×3 IMPLANT
TROCAR 5M 150ML BLDLS (TROCAR) ×3 IMPLANT
TROCAR BALLN 12MMX100 BLUNT (TROCAR) IMPLANT
TROCAR PORT AIRSEAL 5X120 (TROCAR) ×3 IMPLANT
TROCAR XCEL NON-BLD 11X100MML (ENDOMECHANICALS) ×9 IMPLANT
TROCAR XCEL NON-BLD 5MMX100MML (ENDOMECHANICALS) ×6 IMPLANT
TUBING FILTER THERMOFLATOR (ELECTROSURGICAL) ×3 IMPLANT
WARMER LAPAROSCOPE (MISCELLANEOUS) ×3 IMPLANT
WATER STERILE IRR 1000ML POUR (IV SOLUTION) ×3 IMPLANT

## 2015-04-24 NOTE — Anesthesia Preprocedure Evaluation (Addendum)
Anesthesia Evaluation  Patient identified by MRN, date of birth, ID band Patient awake    Reviewed: Allergy & Precautions, NPO status , Patient's Chart, lab work & pertinent test results  Airway Mallampati: II  TM Distance: >3 FB Neck ROM: Full    Dental   Pulmonary former smoker,    Pulmonary exam normal        Cardiovascular hypertension, Pt. on medications Normal cardiovascular exam     Neuro/Psych    GI/Hepatic GERD  Controlled and Medicated,  Endo/Other    Renal/GU      Musculoskeletal   Abdominal   Peds  Hematology   Anesthesia Other Findings   Reproductive/Obstetrics                            Anesthesia Physical Anesthesia Plan  ASA: II  Anesthesia Plan: General   Post-op Pain Management:    Induction: Intravenous  Airway Management Planned: Oral ETT  Additional Equipment:   Intra-op Plan:   Post-operative Plan: Extubation in OR  Informed Consent: I have reviewed the patients History and Physical, chart, labs and discussed the procedure including the risks, benefits and alternatives for the proposed anesthesia with the patient or authorized representative who has indicated his/her understanding and acceptance.     Plan Discussed with: CRNA and Surgeon  Anesthesia Plan Comments:         Anesthesia Quick Evaluation

## 2015-04-24 NOTE — Addendum Note (Signed)
Addendum  created 04/24/15 1931 by Raenette Rover, CRNA   Modules edited: Clinical Notes   Clinical Notes:  File: [id number]

## 2015-04-24 NOTE — Transfer of Care (Signed)
Immediate Anesthesia Transfer of Care Note  Patient: Robin Martin  Procedure(s) Performed: Procedure(s): HYSTERECTOMY TOTAL LAPAROSCOPIC bilateral salpingectomy  (Bilateral) CYSTOSCOPY (N/A)  Patient Location: PACU  Anesthesia Type:General  Level of Consciousness: awake, alert  and oriented  Airway & Oxygen Therapy: Patient Spontanous Breathing and Patient connected to nasal cannula oxygen  Post-op Assessment: Report given to RN and Post -op Vital signs reviewed and stable  Post vital signs: Reviewed and stable  Last Vitals:  Filed Vitals:   04/24/15 0900  BP: 154/89  Pulse: 85  Temp: 36.8 C  Resp: 20    Complications: No apparent anesthesia complications

## 2015-04-24 NOTE — Interval H&P Note (Signed)
History and Physical Interval Note:  04/24/2015 10:03 AM  Robin Martin  has presented today for surgery, with the diagnosis of Dysfunctional Uterine Bleeding  The various methods of treatment have been discussed with the patient and family. After consideration of risks, benefits and other options for treatment, the patient has consented to  Procedure(s): HYSTERECTOMY TOTAL LAPAROSCOPIC bilateral salpingectomy  (Bilateral) Cystoscopy Possible LAVH and Possible TAH as a surgical intervention .  The patient's history has been reviewed, patient examined, no change in status, stable for surgery.  I have reviewed the patient's chart and labs.  Questions were answered to the patient's satisfaction.     Delice Lesch

## 2015-04-24 NOTE — Anesthesia Postprocedure Evaluation (Signed)
Anesthesia Post Note  Patient: Robin Martin  Procedure(s) Performed: Procedure(s) (LRB): HYSTERECTOMY TOTAL LAPAROSCOPIC bilateral salpingectomy  (Bilateral) CYSTOSCOPY (N/A)  Patient location during evaluation: [facility] Anesthesia Type: General Level of consciousness: awake, awake and alert, oriented and patient cooperative Pain management: pain level controlled Vital Signs Assessment: post-procedure vital signs reviewed and stable Respiratory status: spontaneous breathing, nonlabored ventilation, respiratory function stable and patient connected to nasal cannula oxygen Cardiovascular status: stable Postop Assessment: no headache, no backache, patient able to bend at knees and no signs of nausea or vomiting Anesthetic complications: no    Last Vitals:  Filed Vitals:   04/24/15 1815 04/24/15 1835  BP: 114/53 113/54  Pulse: 98 98  Temp: 37.6 C 37.5 C  Resp: 21 20    Last Pain:  Filed Vitals:   04/24/15 1847  PainSc: 6                  Kaylin Schellenberg L

## 2015-04-24 NOTE — Op Note (Addendum)
Preop Diagnosis: 1.Dysfunctional Uterine Bleeding 2.h/o Cervical Dysplasia  Postop Diagnosis: Dysfunctional Uterine Bleeding 2.h/o Cervical Dysplasia  Procedure: 1.HYSTERECTOMY TOTAL LAPAROSCOPIC 2.BILATERAL SALPINGECTOMY 3.CYSTOSCOPY  Anesthesia: General   Anesthesiologist: Josephine Igo, MD   Attending: Everett Graff, MD   Assistant: Earnstine Regal, PA-C  Findings: Nl appearing fallopian tubes.  There were some adhesions of the left ovary to the bowel.  Pathology: Uterus, Cervix and Bilateral Fallopian Tubes (wt 122g)  Fluids: 2600 cc  UOP: 775 cc  EBL: A999333 cc  Complications: None  Procedure: The patient was taken to the operating room, placed under general anesthesia and prepped and draped in the normal sterile fashion. A Foley catheter was placed in the bladder. The uterus sounded to 11.  A weighted speculum and vaginal retractors were placed in the vagina.  Tenaculum was placed on the anterior lip of the cervix.  A size 10 cm tip was used and the rumi was placed, tip balloon and occluder insufflated. Attention was then turned to the abdomen. A 10 mm infraumbilical incision was made with the scalpel after 5 cc of 0.25% percent Marcaine was used for local anesthesia. The Veress needle was placed in the intra-abdominal cavity and insufflation obtained.  A 10 mm trochar was advanced into the intra-abdominal cavity and the laparoscope was introduced.  Two 5 mm trochars were placed in the right and left lower quadrants under direct visualization with the laparoscope and a 10 mm trochar was placed in the suprapubic area under direct visualization as well.  The right fallopian tube was excised with the harmonic and the same was done on the contralateral side.  The harmonic scalpel was then used to cauterize and cut the uterine ovarian ligament on the right as well as the round ligament.  The same was done on the contralateral side. The harmonic was then used to identify the [facility] ring  anteriorly after insufflating the bladder with saline with methylene blue added in order to see the bladder edge due to multiple bladder adhesions to the lower uterine segment.  The uterine artery on the right was cauterized and cut with harmonic scalpel and the same done on the contralateral side down to the level of the koh ring and the koh ring circumscribed.  The uterus was then pulled into the vagina.  Both angles were sutured with 1 PDS.  The  remainder of the cuff was sutured with 1 PDS using interrupted stitches until the vaginal cuff was closed.  A total of approximately 5 sutures were placed.   Irrigation was performed.  Surgicel was placed on the cuff and pedicles and the gas was allowed to leave the abdomen to check for bleeders and all pedicles were seen to be hemostatic following cystoscopy.  The vagina was inspected and the cuff was noted to be intact.  Cystoscopy was performed and both ureters were seen to efflux without difficulty. The bladder had full integrity with no suture or laceration visualized.  Attention was then turned back to the abdomen after re-gowning and gloving. The abdomen was reinsufflated with CO2 gas.   The abdomen and pelvis noted to be hemostatic.  The 10 mm suprapubic trochar was removed under direct visualization and fascia repaired with 0 vicryl.  All trochars were removed under direct visualization using the laparoscope.  The [id number] umbilical fascial incision was repaired with 0 vicryl.  The two 10 mm incisions were closed with 3-0 Monocryl via a subcuticular stitch.  All remaining skin incisions were closed with Liquaband and  the 10 mm skin incisions were reinforced using Liquaband as well.  Sponge lap and needle counts were correct.  The patient tolerated the procedure well and was returned to the PACU in stable condition.

## 2015-04-24 NOTE — Anesthesia Postprocedure Evaluation (Signed)
Anesthesia Post Note  Patient: Robin Martin  Procedure(s) Performed: Procedure(s) (LRB): HYSTERECTOMY TOTAL LAPAROSCOPIC bilateral salpingectomy  (Bilateral) CYSTOSCOPY (N/A)  Patient location during evaluation: PACU Anesthesia Type: General Level of consciousness: awake and alert and oriented Pain management: pain level controlled Vital Signs Assessment: post-procedure vital signs reviewed and stable Respiratory status: spontaneous breathing, nonlabored ventilation, respiratory function stable and patient connected to nasal cannula oxygen Cardiovascular status: blood pressure returned to baseline and stable Postop Assessment: no signs of nausea or vomiting Anesthetic complications: no    Last Vitals:  Filed Vitals:   04/24/15 1715 04/24/15 1730  BP: 115/72 115/57  Pulse: 84 80  Temp:    Resp: 15 15    Last Pain: There were no vitals filed for this visit.               Graycie Halley A.

## 2015-04-24 NOTE — H&P (View-Only) (Signed)
Robin Martin is a 35 y.o.  female,  P: 3-1-1-4  who presents for hysterectomy because of dysfunctional uterine bleeding.  The patient delivered by C-section in October 2016.  Following that delivery she has bled daily,  with the exception of the past 2 weeks when she began norethindrone 5 mg daily.  Her flow would range for heavy to light requiring the change of a super plus tampon every 2 hours to only a single pad  that would last all day.  She has had a negative gonorrhea and chlamydia culture during this prolong bleeding work up  and her TSH has been normal.    A pelvic ultrasound, done December 2016 showed a uterus:  11.8 x 5.4 x 6.1 cm, endometrium-0.84 cm with no visualization of either ovary.  She admits to deep dyspareunia and post coital achiness that will last for 2 days.  She has also noticed that if her bleeding is merely spotting and she has intercourse,  she will have gushes of blood to follow.  She has occasional diarrhea (diet related) and cramping that is relieved with Ibuprofen 800 mg,   but no problems with bladder function.  An endometrial biospy done in February 2017 returned-benign proliferative phase endometrium with no hyperplasia or malignancy.     The patient was tried on combination oral contraceptives for management of her bleeding but had no positive results.  Fortunately,  she has gradually had some relief from her bleeding with norethindrone.   In spite of this,  the patient has decided to proceed with definitive therapy, in the form of hysterectomy,  after a review of both  medical and surgical management options.   Past Medical History  OB History: G: 5;  P: 3-1-1-4:   SVB 2002  (Infant 8lbs.)   C-section: 2005, 2014 and 2016  GYN History: menarche: 35 YO    LMP: see HPI    Contracepton bilateral tubal ligation  The patient reports a past history of: herpes and HPV.  Has a  history of CIN III in 2006, treated with LEEP and have had normal PAP smears since;   Last PAP  smear: 2016-normal  Medical History: Bipolar Disorder,  Hypertension, GERD, Degenerative Disc Disease, Gestational Diabetes, Post Partum Depression, Renal Stone, Bilateral Sciatica, Bilateral Ankle Fractures and Bilateral Forearm Fractures   Surgical History:   2016 Tubal Sterilzation  and 2006  Loop Electrosurgical Excision Procedure Denies problems with anesthesia or history of blood transfusions  Family History:  Diabetes Mellitus, Hypertension, Cardiovascular Disease, Asthma, Depression, Alzheimers, Stroke, Bipolar Disorder and Renal Disease   Social History:   Single with a Soil scientist;  Ship broker;  Denies tobacco or alcohol use   Medications:  Flexeril 10 mg  tid prn Motrin 200 mg  4 po with food every 8 hours prn  Allergies  Allergen Reactions  . Chocolate Other (See Comments)    Thins blood  . Latex Rash  . Morphine And Related Itching, Nausea And Vomiting and Rash     Denies sensitivity to peanuts, shellfish, soy, or adhesives.   ROS: Admits to glasses/contact lenses, occasional diarrhea  and random lightheadedness;  but denies headache, vision changes, nasal congestion, dysphagia, tinnitus,  hoarseness, cough,  chest pain, shortness of breath, nausea, vomiting, constipation,  urinary frequency, urgency  dysuria, hematuria, vaginitis symptoms, pelvic pain, swelling of joints,easy bruising,  myalgias, arthralgias, skin rashes, unexplained weight loss and except as is mentioned in the history of present illness, patient's review of systems is  otherwise negative.  Physical Exam  Bp: 126/78       P: 92       R 20  Temperature: 98.8 degrees F orally    Weight: 04 lbs.  Height: 5\' 7"    BMI: 47.6  Neck: supple without masses or thyromegaly Lungs: clear to auscultation Heart: regular rate and rhythm Abdomen: soft, non-tender and no organomegaly Pelvic:EGBUS- wnl; vagina-normal rugae; uterus-normal size, cervix without lesions or motion tenderness; adnexae-no tenderness or  masses Extremities:  no clubbing, cyanosis or edema   Assesment:  Dysfunctional Uterine Bleeding            History of Severe Cervical Dysplasia            S/P Tubal Sterilization  Disposition:  A discussion was held with patient regarding the indication for her procedure(s) along with the risks, to include, but not limited to: reaction to anesthesia, damage to adjacent organs (including bladder/.bowel) , infection and excessive bleeding. The patient verbalized understanding of these risks and has consented to proceed with a Total Laparoscopic Hysterctomy Bilateral Salpingectomy, Cystoscopy and Possible Laparoscopically Assisted Vaginal Hysterectomy and possible Total Abdominal Hysterectomy at [place] on April 24, 2015.    CSN# [id number]   Jabril Pursell J. Florene Glen, PA-C  for Dr. Harvie Bridge. Mancel Bale

## 2015-04-24 NOTE — Anesthesia Procedure Notes (Signed)
Procedure Name: Intubation Date/Time: 04/24/2015 11:03 AM Performed by: Flossie Dibble Pre-anesthesia Checklist: Patient being monitored, Suction available, Emergency Drugs available, Patient identified and Timeout performed Patient Re-evaluated:Patient Re-evaluated prior to inductionOxygen Delivery Method: Circle system utilized Preoxygenation: Pre-oxygenation with 100% oxygen Intubation Type: IV induction Ventilation: Mask ventilation without difficulty Laryngoscope Size: Mac and 3 Grade View: Grade I Tube type: Oral Tube size: 7.0 mm Number of attempts: 1 (By Jerilynn Mages. Foster MD) Airway Equipment and Method: Patient positioned with wedge pillow and Stylet Placement Confirmation: ETT inserted through vocal cords under direct vision,  breath sounds checked- equal and bilateral and positive ETCO2 Secured at: 21 cm Dental Injury: Teeth and Oropharynx as per pre-operative assessment

## 2015-04-25 DIAGNOSIS — N938 Other specified abnormal uterine and vaginal bleeding: Secondary | ICD-10-CM | POA: Diagnosis not present

## 2015-04-25 LAB — CBC
HCT: 28.8 % — ABNORMAL LOW (ref 36.0–46.0)
HCT: 29 % — ABNORMAL LOW (ref 36.0–46.0)
Hemoglobin: 9.8 g/dL — ABNORMAL LOW (ref 12.0–15.0)
Hemoglobin: 9.9 g/dL — ABNORMAL LOW (ref 12.0–15.0)
MCH: 28.1 pg (ref 26.0–34.0)
MCH: 28.8 pg (ref 26.0–34.0)
MCHC: 34 g/dL (ref 30.0–36.0)
MCHC: 34.1 g/dL (ref 30.0–36.0)
MCV: 82.5 fL (ref 78.0–100.0)
MCV: 84.3 fL (ref 78.0–100.0)
Platelets: 187 10*3/uL (ref 150–400)
Platelets: 176 10*3/uL (ref 150–400)
RBC: 3.49 MIL/uL — ABNORMAL LOW (ref 3.87–5.11)
RBC: 3.44 MIL/uL — ABNORMAL LOW (ref 3.87–5.11)
RDW: 13.7 % (ref 11.5–15.5)
RDW: 13.8 % (ref 11.5–15.5)
WBC: 12.4 10*3/uL — ABNORMAL HIGH (ref 4.0–10.5)
WBC: 12.7 10*3/uL — ABNORMAL HIGH (ref 4.0–10.5)

## 2015-04-25 MED ORDER — DOCUSATE SODIUM 100 MG PO CAPS: 100.0000 mg | ORAL_CAPSULE | Freq: Two times a day (BID) | ORAL | Status: DC

## 2015-04-25 MED ORDER — OXYCODONE-ACETAMINOPHEN 5-325 MG PO TABS: 1.0000 | ORAL_TABLET | Freq: Four times a day (QID) | ORAL | Status: DC | PRN

## 2015-04-25 MED ORDER — IBUPROFEN 600 MG PO TABS
600.0000 mg | ORAL_TABLET | Freq: Four times a day (QID) | ORAL | Status: DC | PRN
Start: 2015-04-25 — End: 2015-06-27

## 2015-04-25 MED ORDER — DIPHENHYDRAMINE HCL 25 MG PO CAPS
25.0000 mg | ORAL_CAPSULE | Freq: Once | ORAL | Status: AC
  Administered 2015-04-25: 25 mg via ORAL
  Filled 2015-04-25: qty 1

## 2015-04-25 NOTE — Discharge Summary (Signed)
Physician Discharge Summary  Patient ID: KA GAYDON MRN: [id number] DOB/AGE: 07-23-1980 35 y.o.  Admit date: 04/24/2015 Discharge date: 04/25/2015  Admission Diagnoses: Admit for surgery DUB H/o cervical dysplasia  Discharge Diagnoses:  Active Problems:   Dysfunctional uterine bleeding h/o cervical dysplasia S/pTLH/BS/Cystoscopy  Discharged Condition: good  Hospital Course: Pt is s/p TLH/BS/Cystoscopy yesterday and doing well.  She c/o some shoulder pain intermittently but is tolerating po, voiding spontaneously, has passed flatus, ambulating well without light headedness or dizziness.  Hgb stable at 9.9.  Discharge instructions reviewed.  Pt is ready to go home on POD 1.  Consults: None  Significant Diagnostic Studies: n/a  Treatments: surgery: TLH/BS/Cystoscopy  Discharge Exam: Blood pressure 114/49, pulse 104, temperature 98.9 F (37.2 C), temperature source Oral, resp. rate 18, height 5' 7.5" (1.715 m), weight 303 lb (137.44 kg), SpO2 95 %, unknown if currently breastfeeding. General appearance: alert and no distress Resp: clear to auscultation bilaterally Cardio: regular rate and rhythm GI: soft, appropriately tender; bowel sounds normal; no masses,  no organomegaly Extremities: no calf tenderness Incision/Wound:dressing c/d/i as well as liquaband  Disposition: 01-Home or Self Care     Medication List    STOP taking these medications        acetaminophen 500 MG tablet  Commonly known as:  TYLENOL     hydrochlorothiazide 25 MG tablet  Commonly known as:  HYDRODIURIL     labetalol 300 MG tablet  Commonly known as:  NORMODYNE     NYQUIL PO      GLYBURIDE 5mg    ranitidine 150 MG tablet  Commonly known as:  ZANTAC      TAKE these medications        calcium carbonate 500 MG chewable tablet  Commonly known as:  TUMS - dosed in mg elemental calcium  Chew 2 tablets by mouth 2 (two) times daily as needed for indigestion or heartburn.     cyclobenzaprine 5 MG tablet  Commonly known as:  FLEXERIL  Take 1 tablet (5 mg total) by mouth 3 (three) times daily as needed for muscle spasms.     docusate sodium 100 MG capsule  Commonly known as:  COLACE  Take 1 capsule (100 mg total) by mouth 2 (two) times daily.     ibuprofen 600 MG tablet  Commonly known as:  ADVIL,MOTRIN  Take 1 tablet (600 mg total) by mouth every 6 (six) hours as needed.     oxyCODONE-acetaminophen 5-325 MG tablet  Commonly known as:  PERCOCET/ROXICET  Take 1-2 tablets by mouth every 6 (six) hours as needed for severe pain (moderate to severe pain (when tolerating fluids)).           Follow-up Information    Follow up with Delice Lesch, MD In 6 weeks.   Specialty:  Obstetrics and Gynecology   Why:  post op appt should already be scheduled   Contact information:   [address] [place] [address] [phone]       Signed: Delice Lesch 04/25/2015, 12:30 PM

## 2015-04-25 NOTE — Progress Notes (Signed)
Discharge instructions reviewed with patient.  Patient states understanding of home care, medications, activity, signs/symptoms to report to MD and return MD office visit.  Patients significant other and family will assist with her care @ home.  No home  equipment needed, patient has prescriptions and all personal belongings.  Patient  discharged in stable condition via wheelchair with staff without incident. 

## 2015-04-27 ENCOUNTER — Encounter (HOSPITAL_COMMUNITY): Payer: Self-pay | Admitting: Obstetrics and Gynecology

## 2015-05-16 ENCOUNTER — Encounter (HOSPITAL_COMMUNITY): Payer: Self-pay | Admitting: *Deleted

## 2015-05-16 ENCOUNTER — Inpatient Hospital Stay (HOSPITAL_COMMUNITY)
Admission: AD | Admit: 2015-05-16 | Discharge: 2015-05-16 | Disposition: A | Discharge status: Home / Self Care | Payer: Medicaid Other | Source: Ambulatory Visit | Attending: Obstetrics and Gynecology | Admitting: Obstetrics and Gynecology

## 2015-05-16 DIAGNOSIS — N939 Abnormal uterine and vaginal bleeding, unspecified: Secondary | ICD-10-CM

## 2015-05-16 DIAGNOSIS — N938 Other specified abnormal uterine and vaginal bleeding: Secondary | ICD-10-CM | POA: Insufficient documentation

## 2015-05-16 DIAGNOSIS — Z90710 Acquired absence of both cervix and uterus: Secondary | ICD-10-CM

## 2015-05-16 MED ORDER — FERRIC SUBSULFATE 259 MG/GM EX SOLN
1.0000 "application " | Freq: Once | CUTANEOUS | Status: AC
  Administered 2015-05-16: 1 via TOPICAL
  Filled 2015-05-16: qty 8

## 2015-05-16 NOTE — Progress Notes (Signed)
monsels applied to vag cuff

## 2015-05-16 NOTE — Progress Notes (Signed)
Written and verbal d/c instructions given and understanding voiced. HAs appt TU[facility] to see Dr Mancel Bale

## 2015-05-16 NOTE — Progress Notes (Signed)
Gavin Pound CNM discussing d/c instructions with pt. Written and verbal d/c instructions given and understanding voiced

## 2015-05-16 NOTE — MAU Provider Note (Signed)
History    Robin Martin is a 35 y.o. female who presents, unannounced, for vaginal bleeding s/p Hysterectomy Total Laparoscopic with Bilateral Salpingectomy, and Cystoscopy. Patient states about 2 1/2-3hrs ago she had some bright red bleeding with clots after using the restroom.  Patient reports cramping that started at the same time.  Patient denies problems with constipation or diarrhea and reports some urinary leakage.  Patient reports taking antibiotics for leakage with no resolve and has an appt on Tuesday to see Dr. Gillermo Murdoch.  Patient reports cleaning house, cooking dinner, laundry, and shopping.  Patient reports dark brown discharge prior to this incident, but no cramping.  Patient denies fever, nausea, and vomiting.  Patient denies recent sexual intercourse.   Patient Active Problem List   Diagnosis Date Noted  . Dysfunctional uterine bleeding 04/24/2015  . Polyhydramnios in third trimester--90%ile, 22.78 11/06/14 11/06/2014  . S/P cesarean section 11/06/2014  . BMI 50.0-59.9, adult ([place]) 11/04/2014  . Positive GBS test 11/04/2014  . H/O postpartum depression, currently pregnant 11/04/2014  . Request for sterilization--consent signed 08/26/14 11/04/2014  . Hypertension--sporadic, related to weight 11/04/2014  . Previous cesarean section x 2 09/13/2014  . Bipolar 1 disorder ([place]) 09/13/2014  . Latex allergy 09/13/2014  .  Hx LGA (large for gestational age) fetus x 2 (9+) 09/13/2014  . Allergy to morphine 09/13/2014  . Gestational diabetes--on insulin 09/13/2014  . 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 Hx of postpartum hemorrhage, currently pregnant--with 1st pregnancy, patient reports cardiac arrest 09/13/2014  . Chronic back pain 09/13/2014  . H/O LEEP--2006 09/13/2014  . Rh negative, maternal 04/06/2012    Chief Complaint  Patient presents with  . Vaginal Bleeding   HPI  OB History    Gravida Para Term Preterm AB TAB SAB Ectopic Multiple Living   5 4 3 1 1  0 1 0 0 4      Past  Medical History  Diagnosis Date  . Herpes   . Abnormal Pap smear   . Depression     post-partum depression  . Bipolar 1 disorder ([place])   . Heartburn in pregnancy   . GERD (gastroesophageal reflux disease)     h/o GERD- wt loss alleviated it  . Infection     urinary tract infection  . Chronic kidney disease     kidney stone at age 89  . Ovarian cyst     age 43  . Cancer ([place]) 2005    LEEP-cervical cancer  . Hypertension   . Pregnancy induced hypertension   . Gestational diabetes mellitus, antepartum     with pregnancy  . Tendonitis     both hands  . Sciatic pain   . Scoliosis   . DDD (degenerative disc disease), lumbar     Past Surgical History  Procedure Laterality Date  . Leep    . Cesarean section with bilateral tubal ligation Bilateral 04/09/2012    BTL not done  . Cesarean section      x2--2005 and 2014  . Cesarean section with bilateral tubal ligation Bilateral 11/06/2014    Procedure: REPEAT CESAREAN SECTION WITH BILATERAL TUBAL LIGATION REMOVAL OF SKIN TAG;  Surgeon: Crawford Givens, MD;  Location: [facility];  Service: Obstetrics;  Laterality: Bilateral;  . Laparoscopic hysterectomy Bilateral 04/24/2015    Procedure: HYSTERECTOMY TOTAL LAPAROSCOPIC bilateral salpingectomy ;  Surgeon: Everett Graff, MD;  Location: [facility];  Service: Gynecology;  Laterality: Bilateral;  . Cystoscopy N/A 04/24/2015    Procedure: CYSTOSCOPY;  Surgeon: Everett Graff,  MD;  Location: [facility];  Service: Gynecology;  Laterality: N/A;    Family History  Problem Relation Age of Onset  . Diabetes Mother   . Hypertension Mother   . Mental illness Mother   . Diabetes Father   . Hypertension Father   . Asthma Sister   . Diabetes Sister   . Diabetes Maternal Grandmother     Social History  Substance Use Topics  . Smoking status: Former Smoker -- 0.50 packs/day    Types: Cigarettes    Quit date: 03/22/2012  . Smokeless tobacco: Never Used  . Alcohol Use: No    Allergies:  Allergies   Allergen Reactions  . Chocolate Other (See Comments)    Thins blood  . Latex Rash  . Morphine And Related Itching, Nausea And Vomiting and Rash    Prescriptions prior to admission  Medication Sig Dispense Refill Last Dose  . calcium carbonate (TUMS - DOSED IN MG ELEMENTAL CALCIUM) 500 MG chewable tablet Chew 2 tablets by mouth 2 (two) times daily as needed for indigestion or heartburn.   05/15/2015 at Unknown time  . cyclobenzaprine (FLEXERIL) 5 MG tablet Take 1 tablet (5 mg total) by mouth 3 (three) times daily as needed for muscle spasms. 30 tablet 0 Past Month at Unknown time  . ibuprofen (ADVIL,MOTRIN) 600 MG tablet Take 1 tablet (600 mg total) by mouth every 6 (six) hours as needed. 40 tablet 1 Past Week at Unknown time  . oxyCODONE-acetaminophen (PERCOCET/ROXICET) 5-325 MG tablet Take 1-2 tablets by mouth every 6 (six) hours as needed for severe pain (moderate to severe pain (when tolerating fluids)). 30 tablet 0 Past Week at Unknown time  . docusate sodium (COLACE) 100 MG capsule Take 1 capsule (100 mg total) by mouth 2 (two) times daily. 10 capsule 0     ROS  See HPI Above Physical Exam   Blood pressure 125/65, pulse 96, temperature 97.8 F (36.6 C), resp. rate 18, height 5\' 7"  (1.702 m), weight 138.529 kg (305 lb 6.4 oz), last menstrual period 02/14/2014, unknown if currently breastfeeding.  No results found for this or any previous visit (from the past 24 hour(s)).  Physical Exam  Constitutional: She is oriented to person, place, and time. She appears well-developed and well-nourished.  Obese  HENT:  Head: Normocephalic and atraumatic.  Eyes: Conjunctivae are normal.  Neck: Normal range of motion.  Cardiovascular: Normal rate.   Respiratory: Effort normal.  GI: Soft.  Genitourinary: There is bleeding in the vagina.  Sterile Speculum Exam: -Vaginal Vault: Moderate amt blood noted -Cervix:Absent: Vaginal cuff with irritation noted at 12'O clock.  Minimal, but active  bleeding noted. Monsels applied with hemostatis noted.   Musculoskeletal: Normal range of motion. She exhibits no edema.  Neurological: She is alert and oriented to person, place, and time.  Skin: Skin is warm and dry.  Psychiatric: She has a normal mood and affect. Her behavior is normal.     ED Course  Assessment: 35y.o. Female Vaginal Bleeding S/P LAVH  Plan: -VE as above-Unable to fully visualize cuff.   -Dr. Gillermo Murdoch consulted and advised: *Obtain larger speculum for optimal visualization *Utilize monsels for hemostatis if minor bleeding and cuff intact *If cuff open, contact MD  Follow Up [id number]) -Extra large speculum obtained -Able to visualize cuff fully -Monsels applied -Education regarding what to expect after monsels application -Q/C addressed -Keep appt as scheduled: Tuesday with Dr. Gillermo Murdoch -Bleeding Precautions -Pain mgmt -Encouraged to call if any questions or concerns arise  prior to next scheduled office visit.  -Discharged to home in improved condition  Maryann Conners CNM, MSN 05/16/2015 2:04 AM

## 2015-05-16 NOTE — Discharge Instructions (Signed)

## 2015-05-16 NOTE — Progress Notes (Signed)
Jessica Emly CNM notified of pt's admission and status. Will see pt 

## 2015-05-16 NOTE — MAU Note (Signed)
Had lap assisted vag hyst on 04/24/15. Tonight had bright red bleeding with blood clots. Dime-size clots. Some cramping

## 2015-06-27 ENCOUNTER — Emergency Department (HOSPITAL_COMMUNITY)
Admission: EM | Admit: 2015-06-27 | Discharge: 2015-06-27 | Disposition: A | Discharge status: Home / Self Care | Payer: Medicaid Other | Attending: Emergency Medicine | Admitting: Emergency Medicine

## 2015-06-27 ENCOUNTER — Encounter (HOSPITAL_COMMUNITY): Payer: Self-pay

## 2015-06-27 DIAGNOSIS — I10 Essential (primary) hypertension: Secondary | ICD-10-CM | POA: Diagnosis not present

## 2015-06-27 DIAGNOSIS — Z87891 Personal history of nicotine dependence: Secondary | ICD-10-CM | POA: Insufficient documentation

## 2015-06-27 DIAGNOSIS — Z8541 Personal history of malignant neoplasm of cervix uteri: Secondary | ICD-10-CM | POA: Diagnosis not present

## 2015-06-27 DIAGNOSIS — Z791 Long term (current) use of non-steroidal anti-inflammatories (NSAID): Secondary | ICD-10-CM | POA: Insufficient documentation

## 2015-06-27 DIAGNOSIS — F329 Major depressive disorder, single episode, unspecified: Secondary | ICD-10-CM | POA: Diagnosis not present

## 2015-06-27 DIAGNOSIS — J069 Acute upper respiratory infection, unspecified: Secondary | ICD-10-CM | POA: Diagnosis not present

## 2015-06-27 DIAGNOSIS — J029 Acute pharyngitis, unspecified: Secondary | ICD-10-CM | POA: Diagnosis present

## 2015-06-27 MED ORDER — PSEUDOEPHEDRINE HCL ER 120 MG PO TB12: 120.0000 mg | ORAL_TABLET | Freq: Two times a day (BID) | ORAL | Status: DC

## 2015-06-27 MED ORDER — MAGIC MOUTHWASH W/LIDOCAINE: 5.0000 mL | Freq: Three times a day (TID) | ORAL | Status: DC | PRN

## 2015-06-27 MED ORDER — BENZONATATE 100 MG PO CAPS: 100.0000 mg | ORAL_CAPSULE | Freq: Three times a day (TID) | ORAL | Status: DC

## 2015-06-27 NOTE — ED Provider Notes (Signed)
CSN: [id number]     Arrival date & time 06/27/15  0906 History   First MD Initiated Contact with Patient 06/27/15 [phone]     Chief Complaint  Patient presents with  . Sore Throat     (Consider location/radiation/quality/duration/timing/severity/associated sxs/prior Treatment) HPI   Robin Martin is a 35 y.o. female who presents to the Emergency Department complaining of gradual onset of cough and sore throat yesterday.  Woke up with worsening sx's and loss of her voice.  Throat pain is worse with swallowing.  Cough is non productive.  She also complains of "fullness" of both ears and mild nasal congestion.  She has not tried any medications.  She denies fever, chest pain, shortness of breath    Past Medical History  Diagnosis Date  . Herpes   . Abnormal Pap smear   . Depression     post-partum depression  . Bipolar 1 disorder ([place])   . Heartburn in pregnancy   . GERD (gastroesophageal reflux disease)     h/o GERD- wt loss alleviated it  . Infection     urinary tract infection  . Chronic kidney disease     kidney stone at age 71  . Ovarian cyst     age 84  . Cancer ([place]) 2005    LEEP-cervical cancer  . Hypertension   . Pregnancy induced hypertension   . Gestational diabetes mellitus, antepartum     with pregnancy  . Tendonitis     both hands  . Sciatic pain   . Scoliosis   . DDD (degenerative disc disease), lumbar    Past Surgical History  Procedure Laterality Date  . Leep    . Cesarean section with bilateral tubal ligation Bilateral 04/09/2012    BTL not done  . Cesarean section      x2--2005 and 2014  . Cesarean section with bilateral tubal ligation Bilateral 11/06/2014    Procedure: REPEAT CESAREAN SECTION WITH BILATERAL TUBAL LIGATION REMOVAL OF SKIN TAG;  Surgeon: Crawford Givens, MD;  Location: [facility];  Service: Obstetrics;  Laterality: Bilateral;  . Laparoscopic hysterectomy Bilateral 04/24/2015    Procedure: HYSTERECTOMY TOTAL LAPAROSCOPIC bilateral  salpingectomy ;  Surgeon: Everett Graff, MD;  Location: [facility];  Service: Gynecology;  Laterality: Bilateral;  . Cystoscopy N/A 04/24/2015    Procedure: CYSTOSCOPY;  Surgeon: Everett Graff, MD;  Location: [facility];  Service: Gynecology;  Laterality: N/A;   Family History  Problem Relation Age of Onset  . Diabetes Mother   . Hypertension Mother   . Mental illness Mother   . Diabetes Father   . Hypertension Father   . Asthma Sister   . Diabetes Sister   . Diabetes Maternal Grandmother    Social History  Substance Use Topics  . Smoking status: Former Smoker -- 0.50 packs/day    Types: Cigarettes    Quit date: 03/22/2012  . Smokeless tobacco: Never Used  . Alcohol Use: No   OB History    Gravida Para Term Preterm AB TAB SAB Ectopic Multiple Living   5 4 3 1 1  0 1 0 0 4     Review of Systems  Constitutional: Negative for fever, chills, activity change and appetite change.  HENT: Positive for congestion, ear pain, sore throat and voice change. Negative for facial swelling, rhinorrhea and trouble swallowing.   Eyes: Negative for visual disturbance.  Respiratory: Positive for cough. Negative for chest tightness, shortness of breath, wheezing and stridor.   Cardiovascular: Negative for chest pain.  Gastrointestinal: Negative for nausea, vomiting and abdominal pain.  Musculoskeletal: Negative for arthralgias, neck pain and neck stiffness.  Skin: Negative.   Neurological: Negative for dizziness, weakness, numbness and headaches.  Hematological: Negative for adenopathy.  Psychiatric/Behavioral: Negative for confusion.  All other systems reviewed and are negative.     Allergies  Chocolate; Latex; and Morphine and related  Home Medications   Prior to Admission medications   Medication Sig Start Date End Date Taking? Authorizing Provider  calcium carbonate (TUMS - DOSED IN MG ELEMENTAL CALCIUM) 500 MG chewable tablet Chew 2 tablets by mouth 2 (two) times daily as needed for  indigestion or heartburn.    Historical Provider, MD  cyclobenzaprine (FLEXERIL) 5 MG tablet Take 1 tablet (5 mg total) by mouth 3 (three) times daily as needed for muscle spasms. 03/09/15   Evalee Jefferson, PA-C  ibuprofen (ADVIL,MOTRIN) 600 MG tablet Take 1 tablet (600 mg total) by mouth every 6 (six) hours as needed. 04/25/15   Everett Graff, MD  oxyCODONE-acetaminophen (PERCOCET/ROXICET) 5-325 MG tablet Take 1-2 tablets by mouth every 6 (six) hours as needed for severe pain (moderate to severe pain (when tolerating fluids)). 04/25/15   Everett Graff, MD   BP 141/92 mmHg  Pulse 86  Temp(Src) 97.9 F (36.6 C) (Oral)  Resp 18  Ht 5' 7.5" (1.715 m)  Wt 125.193 kg  BMI 42.56 kg/m2  SpO2 98%  LMP 02/14/2014 Physical Exam  Constitutional: She is oriented to person, place, and time. She appears well-developed and well-nourished. No distress.  HENT:  Head: Normocephalic and atraumatic.  Right Ear: Tympanic membrane and ear canal normal.  Left Ear: Tympanic membrane and ear canal normal.  Nose: Mucosal edema and rhinorrhea present.  Mouth/Throat: Uvula is midline and mucous membranes are normal. No trismus in the jaw. No uvula swelling. Posterior oropharyngeal erythema present. No oropharyngeal exudate, posterior oropharyngeal edema or tonsillar abscesses.  Mild right middle ear effusion.  Air fluid levels noted.  No erythema or bulging.   Eyes: Conjunctivae are normal.  Neck: Normal range of motion, full passive range of motion without pain and phonation normal. Neck supple. No Kernig's sign noted.  Cardiovascular: Normal rate, regular rhythm, normal heart sounds and intact distal pulses.   No murmur heard. Pulmonary/Chest: Effort normal and breath sounds normal. No respiratory distress. She has no wheezes. She has no rales.  Abdominal: Soft. She exhibits no distension. There is no tenderness. There is no rebound and no guarding.  Musculoskeletal: She exhibits no edema.  Lymphadenopathy:    She  has no cervical adenopathy.  Neurological: She is alert and oriented to person, place, and time. She exhibits normal muscle tone. Coordination normal.  Skin: Skin is warm and dry.  Nursing note and vitals reviewed.   ED Course  Procedures (including critical care time) Labs Review Labs Reviewed - No data to display  Imaging Review No results found. I have personally reviewed and evaluated these images and lab results as part of my medical decision-making.   EKG Interpretation None      MDM   Final diagnoses:  URI (upper respiratory infection)    Pt is well appearing.  Non-toxic.  Sx's likely viral.  Pt agrees to symptomatic tx and close PMD f/u    Kem Parkinson, PA-C 06/28/15 [place], MD 06/29/15 2147

## 2015-06-27 NOTE — Discharge Instructions (Signed)

## 2015-06-27 NOTE — ED Notes (Signed)
Come of sore throat and unable to talk

## 2015-07-09 ENCOUNTER — Inpatient Hospital Stay (HOSPITAL_COMMUNITY)
Admission: AD | Admit: 2015-07-09 | Discharge: 2015-07-09 | Disposition: A | Discharge status: Home / Self Care | Payer: Medicaid Other | Source: Ambulatory Visit | Attending: Obstetrics & Gynecology | Admitting: Obstetrics & Gynecology

## 2015-07-09 ENCOUNTER — Inpatient Hospital Stay (HOSPITAL_COMMUNITY): Payer: Medicaid Other

## 2015-07-09 ENCOUNTER — Encounter (HOSPITAL_COMMUNITY): Payer: Self-pay | Admitting: *Deleted

## 2015-07-09 DIAGNOSIS — N760 Acute vaginitis: Secondary | ICD-10-CM | POA: Diagnosis not present

## 2015-07-09 DIAGNOSIS — B9689 Other specified bacterial agents as the cause of diseases classified elsewhere: Secondary | ICD-10-CM

## 2015-07-09 DIAGNOSIS — R1032 Left lower quadrant pain: Secondary | ICD-10-CM | POA: Diagnosis not present

## 2015-07-09 DIAGNOSIS — R109 Unspecified abdominal pain: Secondary | ICD-10-CM | POA: Diagnosis present

## 2015-07-09 DIAGNOSIS — Z87891 Personal history of nicotine dependence: Secondary | ICD-10-CM | POA: Diagnosis not present

## 2015-07-09 DIAGNOSIS — A499 Bacterial infection, unspecified: Secondary | ICD-10-CM | POA: Diagnosis not present

## 2015-07-09 DIAGNOSIS — N3000 Acute cystitis without hematuria: Secondary | ICD-10-CM

## 2015-07-09 LAB — URINALYSIS, ROUTINE W REFLEX MICROSCOPIC
Bilirubin Urine: NEGATIVE
Glucose, UA: NEGATIVE mg/dL
Hgb urine dipstick: NEGATIVE
Ketones, ur: NEGATIVE mg/dL
Nitrite: NEGATIVE
Protein, ur: NEGATIVE mg/dL
Specific Gravity, Urine: 1.025 (ref 1.005–1.030)
pH: 5.5 (ref 5.0–8.0)

## 2015-07-09 LAB — CBC
HCT: 39.4 % (ref 36.0–46.0)
Hemoglobin: 13.4 g/dL (ref 12.0–15.0)
MCH: 27.3 pg (ref 26.0–34.0)
MCHC: 34 g/dL (ref 30.0–36.0)
MCV: 80.2 fL (ref 78.0–100.0)
Platelets: 222 10*3/uL (ref 150–400)
RBC: 4.91 MIL/uL (ref 3.87–5.11)
RDW: 13.1 % (ref 11.5–15.5)
WBC: 10.1 10*3/uL (ref 4.0–10.5)

## 2015-07-09 LAB — URINE MICROSCOPIC-ADD ON

## 2015-07-09 LAB — WET PREP, GENITAL
Sperm: NONE SEEN
Trich, Wet Prep: NONE SEEN
Yeast Wet Prep HPF POC: NONE SEEN

## 2015-07-09 MED ORDER — METRONIDAZOLE 500 MG PO TABS: 500.0000 mg | ORAL_TABLET | Freq: Two times a day (BID) | ORAL | Status: DC

## 2015-07-09 MED ORDER — SULFAMETHOXAZOLE-TRIMETHOPRIM 800-160 MG PO TABS: 1.0000 | ORAL_TABLET | Freq: Two times a day (BID) | ORAL | Status: DC

## 2015-07-09 NOTE — Discharge Instructions (Signed)
Bacterial Vaginosis Bacterial vaginosis is a vaginal infection that occurs when the normal balance of bacteria in the vagina is disrupted. It results from an overgrowth of certain bacteria. This is the most common vaginal infection in women of childbearing age. Treatment is important to prevent complications, especially in pregnant women, as it can cause a premature delivery. CAUSES  Bacterial vaginosis is caused by an increase in harmful bacteria that are normally present in smaller amounts in the vagina. Several different kinds of bacteria can cause bacterial vaginosis. However, the reason that the condition develops is not fully understood. RISK FACTORS Certain activities or behaviors can put you at an increased risk of developing bacterial vaginosis, including:  Having a new sex partner or multiple sex partners.  Douching.  Using an intrauterine device (IUD) for contraception. Women do not get bacterial vaginosis from toilet seats, bedding, swimming pools, or contact with objects around them. SIGNS AND SYMPTOMS  Some women with bacterial vaginosis have no signs or symptoms. Common symptoms include:  Grey vaginal discharge.  A fishlike odor with discharge, especially after sexual intercourse.  Itching or burning of the vagina and vulva.  Burning or pain with urination. DIAGNOSIS  Your health care provider will take a medical history and examine the vagina for signs of bacterial vaginosis. A sample of vaginal fluid may be taken. Your health care provider will look at this sample under a microscope to check for bacteria and abnormal cells. A vaginal pH test may also be done.  TREATMENT  Bacterial vaginosis may be treated with antibiotic medicines. These may be given in the form of a pill or a vaginal cream. A second round of antibiotics may be prescribed if the condition comes back after treatment. Because bacterial vaginosis increases your risk for sexually transmitted diseases, getting  treated can help reduce your risk for chlamydia, gonorrhea, HIV, and herpes. HOME CARE INSTRUCTIONS   Only take over-the-counter or prescription medicines as directed by your health care provider. If antibiotic medicine was prescribed, take it as directed. Make sure you finish it even if you start to feel better.Urinary Tract Infection Urinary tract infections (UTIs) can develop anywhere along your urinary tract. Your urinary tract is your body's drainage system for removing wastes and extra water. Your urinary tract includes two kidneys, two ureters, a bladder, and a urethra. Your kidneys are a pair of bean-shaped organs. Each kidney is about the size of your fist. They are located below your ribs, one on each side of your spine. CAUSES Infections are caused by microbes, which are microscopic organisms, including fungi, viruses, and bacteria. These organisms are so small that they can only be seen through a microscope. Bacteria are the microbes that most commonly cause UTIs. SYMPTOMS  Symptoms of UTIs may vary by age and gender of the patient and by the location of the infection. Symptoms in young women typically include a frequent and intense urge to urinate and a painful, burning feeling in the bladder or urethra during urination. Older women and men are more likely to be tired, shaky, and weak and have muscle aches and abdominal pain. A fever may mean the infection is in your kidneys. Other symptoms of a kidney infection include pain in your back or sides below the ribs, nausea, and vomiting. DIAGNOSIS To diagnose a UTI, your caregiver will ask you about your symptoms. Your caregiver will also ask you to provide a urine sample. The urine sample will be tested for bacteria and white blood cells. White  blood cells are made by your body to help fight infection. TREATMENT  Typically, UTIs can be treated with medication. Because most UTIs are caused by a bacterial infection, they usually can be treated  with the use of antibiotics. The choice of antibiotic and length of treatment depend on your symptoms and the type of bacteria causing your infection. HOME CARE INSTRUCTIONS If you were prescribed antibiotics, take them exactly as your caregiver instructs you. Finish the medication even if you feel better after you have only taken some of the medication. Drink enough water and fluids to keep your urine clear or pale yellow. Avoid caffeine, tea, and carbonated beverages. They tend to irritate your bladder. Empty your bladder often. Avoid holding urine for long periods of time. Empty your bladder before and after sexual intercourse. After a bowel movement, women should cleanse from front to back. Use each tissue only once. SEEK MEDICAL CARE IF:  You have back pain. You develop a fever. Your symptoms do not begin to resolve within 3 days. SEEK IMMEDIATE MEDICAL CARE IF:  You have severe back pain or lower abdominal pain. You develop chills. You have nausea or vomiting. You have continued burning or discomfort with urination. MAKE SURE YOU:  Understand these instructions. Will watch your condition. Will get help right away if you are not doing well or get worse.   This information is not intended to replace advice given to you by your health care provider. Make sure you discuss any questions you have with your health care provider.   Document Released: 10/20/2004 Document Revised: 10/01/2014 Document Reviewed: 02/18/2011 Elsevier Interactive Patient Education [employer].    During treatment, it is important that you follow these instructions:  Avoid sexual activity or use condoms correctly.  Do not douche.  Avoid alcohol as directed by your health care provider.  Avoid breastfeeding as directed by your health care provider. SEEK MEDICAL CARE IF:   Your symptoms are not improving after 3 days of treatment.  You have increased discharge or pain.  You have a fever. MAKE  SURE YOU:   Understand these instructions.  Will watch your condition.  Will get help right away if you are not doing well or get worse. FOR MORE INFORMATION  Centers for Disease Control and Prevention, Division of STD Prevention: [employer] American Sexual Health Association (ASHA): www.ashastd.org    This information is not intended to replace advice given to you by your health care provider. Make sure you discuss any questions you have with your health care provider.   Document Released: 01/10/2005 Document Revised: 01/31/2014 Document Reviewed: 08/22/2012 Elsevier Interactive Patient Education [employer].

## 2015-07-09 NOTE — MAU Note (Addendum)
Pt C/O lower abd pain & R lower back pain for the last week.  Also has foul smelling vaginal discharge - "smells sour" for 2 days.  LAVH in March.  Pt states she is still having bleeding with intercourse since her surgery.  Denies dysuria, has some urine incontinence x 1 week.

## 2015-07-09 NOTE — MAU Provider Note (Signed)
History     CSN: [id number]  Arrival date and time: 07/09/15 1038   First Provider Initiated Contact with Patient 07/09/15 1125      Chief Complaint  Patient presents with  . Abdominal Pain  . Back Pain  . Vaginal Discharge   HPI  Robin Martin is a 35 y.o. female who presents for abdominal pain, back pain, & vaginal discharge.  Had total hysterectomy in April by [place]. Went to office yesterday for concerns d/t pain & vaginal discharge but was turned away d/t Medicaid and told to come to MAU.  Reports pain at incision site in LLQ. Constant pulling/burning pain. Rates pain 6/10. Took ibuprofen last night with improvement in symptoms.  Increased urinary frequency & left flank pain.  Vaginal discharge x 1 week. Foul smelling discharge is clear & "creamy". Denies irritation or itching. Some vaginal bleeding with intercourse since surgery.  Denies n/v/d, constipation, dysuria, hematuria, or fever.   OB History    Gravida Para Term Preterm AB TAB SAB Ectopic Multiple Living   5 4 3 1 1  0 1 0 0 4      Past Medical History  Diagnosis Date  . Herpes   . Abnormal Pap smear   . Depression     post-partum depression  . Bipolar 1 disorder ([place])   . Heartburn in pregnancy   . GERD (gastroesophageal reflux disease)     h/o GERD- wt loss alleviated it  . Infection     urinary tract infection  . Chronic kidney disease     kidney stone at age 21  . Ovarian cyst     age 48  . Cancer ([place]) 2005    LEEP-cervical cancer  . Hypertension   . Pregnancy induced hypertension   . Gestational diabetes mellitus, antepartum     with pregnancy  . Tendonitis     both hands  . Sciatic pain   . Scoliosis   . DDD (degenerative disc disease), lumbar     Past Surgical History  Procedure Laterality Date  . [place]    . Cesarean section with bilateral tubal ligation Bilateral 04/09/2012    BTL not done  . Cesarean section      x2--2005 and 2014  . Cesarean section with bilateral tubal ligation  Bilateral 11/06/2014    Procedure: REPEAT CESAREAN SECTION WITH BILATERAL TUBAL LIGATION REMOVAL OF SKIN TAG;  Surgeon: Crawford Givens, MD;  Location: [facility];  Service: Obstetrics;  Laterality: Bilateral;  . Laparoscopic hysterectomy Bilateral 04/24/2015    Procedure: HYSTERECTOMY TOTAL LAPAROSCOPIC bilateral salpingectomy ;  Surgeon: Everett Graff, MD;  Location: [facility];  Service: Gynecology;  Laterality: Bilateral;  . Cystoscopy N/A 04/24/2015    Procedure: CYSTOSCOPY;  Surgeon: Everett Graff, MD;  Location: [facility];  Service: Gynecology;  Laterality: N/A;    Family History  Problem Relation Age of Onset  . Diabetes Mother   . Hypertension Mother   . Mental illness Mother   . Diabetes Father   . Hypertension Father   . Asthma Sister   . Diabetes Sister   . Diabetes Maternal Grandmother     Social History  Substance Use Topics  . Smoking status: Former Smoker -- 0.50 packs/day    Types: Cigarettes    Quit date: 03/22/2012  . Smokeless tobacco: Never Used  . Alcohol Use: Yes     Comment: occasional    Allergies:  Allergies  Allergen Reactions  . Chocolate Other (See Comments)    Thins  blood  . Latex Rash  . Morphine And Related Itching, Nausea And Vomiting and Rash    Prescriptions prior to admission  Medication Sig Dispense Refill Last Dose  . benzonatate (TESSALON) 100 MG capsule Take 1 capsule (100 mg total) by mouth 3 (three) times daily. Swallow whole, do not chew 21 capsule 0   . calcium carbonate (TUMS - DOSED IN MG ELEMENTAL CALCIUM) 500 MG chewable tablet Chew 2 tablets by mouth 2 (two) times daily as needed for indigestion or heartburn.   Past Week at Unknown time  . magic mouthwash w/lidocaine SOLN Take 5 mLs by mouth 3 (three) times daily as needed for mouth pain. Swish and spit, do not swallow 100 mL 0   . pseudoephedrine (SUDAFED 12 HOUR) 120 MG 12 hr tablet Take 1 tablet (120 mg total) by mouth 2 (two) times daily. 20 tablet 0   . ranitidine (ZANTAC) 75 MG  tablet Take 75 mg by mouth 2 (two) times daily as needed for heartburn.   Past Week at Unknown time    Review of Systems  Constitutional: Negative for fever and chills.  Gastrointestinal: Positive for abdominal pain. Negative for nausea, vomiting, diarrhea and constipation.  Genitourinary: Positive for urgency, frequency and flank pain. Negative for dysuria and hematuria.       + vaginal discharge  Musculoskeletal: Positive for back pain.   Physical Exam   Blood pressure 140/70, pulse 80, temperature 98.4 F (36.9 C), temperature source Oral, resp. rate 16, last menstrual period 02/14/2014, unknown if currently breastfeeding.  Physical Exam  Nursing note and vitals reviewed. Constitutional: She is oriented to person, place, and time. Vital signs are normal. She appears well-developed and well-nourished. No distress.  HENT:  Head: Normocephalic and atraumatic.  Eyes: Conjunctivae are normal. Right eye exhibits no discharge. Left eye exhibits no discharge. No scleral icterus.  Neck: Normal range of motion.  Cardiovascular: Normal rate, regular rhythm and normal heart sounds.   No murmur heard. Respiratory: Effort normal and breath sounds normal. No respiratory distress. She has no wheezes.  GI: Soft. Bowel sounds are normal. There is tenderness in the left lower quadrant. There is CVA tenderness (right). There is no rebound and no guarding.  Genitourinary: Left adnexum displays mass and tenderness. No bleeding in the vagina. Vaginal discharge (small amount of thick yellow discharge) found.  Vaginal cuff intact -- no evidence of inflammation  Neurological: She is alert and oriented to person, place, and time.  Skin: Skin is warm and dry. She is not diaphoretic.  Psychiatric: She has a normal mood and affect. Her behavior is normal. Judgment and thought content normal.    MAU Course  Procedures GC/Chlamydia probe amp ([place])not at [facility]     Status: None   Collection Time:  07/09/15 12:00 AM  Result Value Ref Range   Chlamydia Negative     Comment: Normal Reference Range - Negative   Neisseria gonorrhea Negative     Comment: Normal Reference Range - Negative  Urinalysis, Routine w reflex microscopic (not at [facility])     Status: Abnormal   Collection Time: 07/09/15 10:52 AM  Result Value Ref Range   Color, Urine YELLOW YELLOW   APPearance CLEAR CLEAR   Specific Gravity, Urine 1.025 1.005 - 1.030   pH 5.5 5.0 - 8.0   Glucose, UA NEGATIVE NEGATIVE mg/dL   Hgb urine dipstick NEGATIVE NEGATIVE   Bilirubin Urine NEGATIVE NEGATIVE   Ketones, ur NEGATIVE NEGATIVE mg/dL   Protein, ur  NEGATIVE NEGATIVE mg/dL   Nitrite NEGATIVE NEGATIVE   Leukocytes, UA SMALL (A) NEGATIVE  Urine microscopic-add on     Status: Abnormal   Collection Time: 07/09/15 10:52 AM  Result Value Ref Range   Squamous Epithelial / LPF 0-5 (A) NONE SEEN   WBC, UA 0-5 0 - 5 WBC/hpf   RBC / HPF 0-5 0 - 5 RBC/hpf   Bacteria, UA MANY (A) NONE SEEN  Urine culture     Status: Abnormal   Collection Time: 07/09/15 10:52 AM  Result Value Ref Range   Specimen Description URINE, CLEAN CATCH    Special Requests NONE    Culture (A)     <10,000 COLONIES/mL INSIGNIFICANT GROWTH Performed at [facility]    Report Status 07/10/2015 FINAL   CBC     Status: None   Collection Time: 07/09/15 11:51 AM  Result Value Ref Range   WBC 10.1 4.0 - 10.5 K/uL   RBC 4.91 3.87 - 5.11 MIL/uL   Hemoglobin 13.4 12.0 - 15.0 g/dL   HCT 39.4 36.0 - 46.0 %   MCV 80.2 78.0 - 100.0 fL   MCH 27.3 26.0 - 34.0 pg   MCHC 34.0 30.0 - 36.0 g/dL   RDW 13.1 11.5 - 15.5 %   Platelets 222 150 - 400 K/uL  Wet prep, genital     Status: Abnormal   Collection Time: 07/09/15 12:30 PM  Result Value Ref Range   Yeast Wet Prep HPF POC NONE SEEN NONE SEEN   Trich, Wet Prep NONE SEEN NONE SEEN   Clue Cells Wet Prep HPF POC PRESENT (A) NONE SEEN   WBC, Wet Prep HPF POC MANY (A) NONE SEEN    Comment: BACTERIA- TOO NUMEROUS TO  COUNT   Sperm NONE SEEN     MDM CBC, wet prep, GC/CT Urine culture sent d/t symptoms Will tx for UTI based on symptoms & exam; no microscopic hematuria or evidence of pyelo/stones at this time.  Assessment and Plan  A: 1. BV (bacterial vaginosis)   2. LLQ pain   3. Acute cystitis without hematuria     P: Discharge home Rx flagyl & bactrim Urine culture pending GC/CT pending Gyn provider list given for f/u as needed   Jorje Guild 07/09/2015, 11:25 AM

## 2015-07-10 LAB — URINE CULTURE: Culture: <10000 — AB

## 2015-07-10 LAB — GC/CHLAMYDIA PROBE AMP (~~LOC~~) NOT AT ARMC
Chlamydia: NEGATIVE
Neisseria Gonorrhea: NEGATIVE

## 2015-09-05 ENCOUNTER — Emergency Department (HOSPITAL_COMMUNITY)
Admission: EM | Admit: 2015-09-05 | Discharge: 2015-09-05 | Disposition: A | Discharge status: Home / Self Care | Payer: Medicaid Other | Attending: Emergency Medicine | Admitting: Emergency Medicine

## 2015-09-05 ENCOUNTER — Encounter (HOSPITAL_COMMUNITY): Payer: Self-pay | Admitting: Emergency Medicine

## 2015-09-05 DIAGNOSIS — N390 Urinary tract infection, site not specified: Secondary | ICD-10-CM | POA: Diagnosis not present

## 2015-09-05 DIAGNOSIS — R319 Hematuria, unspecified: Secondary | ICD-10-CM | POA: Diagnosis present

## 2015-09-05 DIAGNOSIS — Z87891 Personal history of nicotine dependence: Secondary | ICD-10-CM | POA: Insufficient documentation

## 2015-09-05 DIAGNOSIS — I10 Essential (primary) hypertension: Secondary | ICD-10-CM | POA: Diagnosis not present

## 2015-09-05 DIAGNOSIS — Z79899 Other long term (current) drug therapy: Secondary | ICD-10-CM | POA: Insufficient documentation

## 2015-09-05 DIAGNOSIS — Z791 Long term (current) use of non-steroidal anti-inflammatories (NSAID): Secondary | ICD-10-CM | POA: Insufficient documentation

## 2015-09-05 DIAGNOSIS — R109 Unspecified abdominal pain: Secondary | ICD-10-CM | POA: Insufficient documentation

## 2015-09-05 LAB — URINE MICROSCOPIC-ADD ON

## 2015-09-05 LAB — URINALYSIS, ROUTINE W REFLEX MICROSCOPIC
Bilirubin Urine: NEGATIVE
Glucose, UA: NEGATIVE mg/dL
Ketones, ur: NEGATIVE mg/dL
Nitrite: POSITIVE — AB
Specific Gravity, Urine: >1.03 — ABNORMAL HIGH (ref 1.005–1.030)
pH: 6 (ref 5.0–8.0)

## 2015-09-05 MED ORDER — CEPHALEXIN 500 MG PO CAPS
500.0000 mg | ORAL_CAPSULE | Freq: Once | ORAL | Status: AC
  Administered 2015-09-05: 500 mg via ORAL
  Filled 2015-09-05: qty 1

## 2015-09-05 MED ORDER — CEPHALEXIN 500 MG PO CAPS: 500.0000 mg | ORAL_CAPSULE | Freq: Four times a day (QID) | ORAL | 0 refills | Status: DC

## 2015-09-05 NOTE — Discharge Instructions (Signed)
Take the antibiotic as directed. Would expect improvement in 2 days if not return for further evaluation. Return earlier for any new or worse symptoms. Urine culture has been sent and is pending.

## 2015-09-05 NOTE — ED Provider Notes (Signed)
[facility] DEPT Provider Note   CSN: [id number] Arrival date & time: 09/05/15  1859  First Provider Contact:  First MD Initiated Contact with Patient 09/05/15 1954        History   Chief Complaint Chief Complaint  Patient presents with  . Hematuria  . Abdominal Pain    HPI Robin Martin is a 35 y.o. female.  Patient now with one day history of dysuria and hematuria. Reminds patient of past urinary tract infections. Patient without any nausea or vomiting no fevers.      Past Medical History:  Diagnosis Date  . Abnormal Pap smear   . Bipolar 1 disorder ([place])   . Cancer ([place]) 2005   LEEP-cervical cancer  . Chronic kidney disease    kidney stone at age 40  . DDD (degenerative disc disease), lumbar   . Depression    post-partum depression  . GERD (gastroesophageal reflux disease)    h/o GERD- wt loss alleviated it  . Gestational diabetes mellitus, antepartum    with pregnancy  . Heartburn in pregnancy   . Herpes   . Hypertension   . Ovarian cyst    age 47  . Pregnancy induced hypertension   . Sciatic pain   . Scoliosis   . Tendonitis    both hands    Patient Active Problem List   Diagnosis Date Noted  . Dysfunctional uterine bleeding 04/24/2015  . Polyhydramnios in third trimester--90%ile, 22.78 11/06/14 11/06/2014  . S/P cesarean section 11/06/2014  . BMI 50.0-59.9, adult ([place]) 11/04/2014  . Positive GBS test 11/04/2014  . H/O postpartum depression, currently pregnant 11/04/2014  . Request for sterilization--consent signed 08/26/14 11/04/2014  . Hypertension--sporadic, related to weight 11/04/2014  . Previous cesarean section x 2 09/13/2014  . Bipolar 1 disorder ([place]) 09/13/2014  . Latex allergy 09/13/2014  .  Hx LGA (large for gestational age) fetus x 2 (9+) 09/13/2014  . Allergy to morphine 09/13/2014  . Gestational diabetes--on insulin 09/13/2014  . 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 Hx of postpartum hemorrhage, currently pregnant--with 1st  pregnancy, patient reports cardiac arrest 09/13/2014  . Chronic back pain 09/13/2014  . H/O LEEP--2006 09/13/2014  . Rh negative, maternal 04/06/2012    Past Surgical History:  Procedure Laterality Date  . CESAREAN SECTION     x2--2005 and 2014  . CESAREAN SECTION WITH BILATERAL TUBAL LIGATION Bilateral 04/09/2012   BTL not done  . CESAREAN SECTION WITH BILATERAL TUBAL LIGATION Bilateral 11/06/2014   Procedure: REPEAT CESAREAN SECTION WITH BILATERAL TUBAL LIGATION REMOVAL OF SKIN TAG;  Surgeon: Crawford Givens, MD;  Location: [facility];  Service: Obstetrics;  Laterality: Bilateral;  . CYSTOSCOPY N/A 04/24/2015   Procedure: CYSTOSCOPY;  Surgeon: Everett Graff, MD;  Location: [facility];  Service: Gynecology;  Laterality: N/A;  . LAPAROSCOPIC HYSTERECTOMY Bilateral 04/24/2015   Procedure: HYSTERECTOMY TOTAL LAPAROSCOPIC bilateral salpingectomy ;  Surgeon: Everett Graff, MD;  Location: [facility];  Service: Gynecology;  Laterality: Bilateral;  . LEEP      OB History    Gravida Para Term Preterm AB Living   5 4 3 1 1 4    SAB TAB Ectopic Multiple Live Births   1 0 0 0 4       Home Medications    Prior to Admission medications   Medication Sig Start Date End Date Taking? Authorizing Provider  benzonatate (TESSALON) 100 MG capsule Take 1 capsule (100 mg total) by mouth 3 (three) times daily. Swallow whole, do not chew 06/27/15  Tammy Triplett, PA-C  calcium carbonate (TUMS - DOSED IN MG ELEMENTAL CALCIUM) 500 MG chewable tablet Chew 2 tablets by mouth 2 (two) times daily as needed for indigestion or heartburn.    Historical Provider, MD  cephALEXin (KEFLEX) 500 MG capsule Take 1 capsule (500 mg total) by mouth 4 (four) times daily. 09/05/15   Fredia Sorrow, MD  ibuprofen (ADVIL,MOTRIN) 200 MG tablet Take 800 mg by mouth every 6 (six) hours as needed for moderate pain.    Historical Provider, MD  magic mouthwash w/lidocaine SOLN Take 5 mLs by mouth 3 (three) times daily as needed for mouth pain.  Swish and spit, do not swallow Patient not taking: Reported on 07/09/2015 06/27/15   Tammy Triplett, PA-C  metroNIDAZOLE (FLAGYL) 500 MG tablet Take 1 tablet (500 mg total) by mouth 2 (two) times daily. 07/09/15   Jorje Guild, NP  pseudoephedrine (SUDAFED 12 HOUR) 120 MG 12 hr tablet Take 1 tablet (120 mg total) by mouth 2 (two) times daily. 06/27/15   Tammy Triplett, PA-C  ranitidine (ZANTAC) 75 MG tablet Take 75 mg by mouth 2 (two) times daily as needed for heartburn.    Historical Provider, MD  sulfamethoxazole-trimethoprim (BACTRIM DS,SEPTRA DS) 800-160 MG tablet Take 1 tablet by mouth 2 (two) times daily. 07/09/15   Jorje Guild, NP    Family History Family History  Problem Relation Age of Onset  . Diabetes Mother   . Hypertension Mother   . Mental illness Mother   . Diabetes Father   . Hypertension Father   . Asthma Sister   . Diabetes Sister   . Diabetes Maternal Grandmother     Social History Social History  Substance Use Topics  . Smoking status: Former Smoker    Packs/day: 0.50    Types: Cigarettes    Quit date: 03/22/2012  . Smokeless tobacco: Never Used  . Alcohol use Yes     Comment: occasional     Allergies   Chocolate; Latex; and Morphine and related   Review of Systems Review of Systems  Constitutional: Negative for fever.  HENT: Negative for congestion.   Eyes: Negative for visual disturbance.  Respiratory: Negative for shortness of breath.   Cardiovascular: Negative for chest pain.  Gastrointestinal: Positive for abdominal pain.  Genitourinary: Positive for dysuria and hematuria.  Musculoskeletal: Negative for back pain.  Skin: Negative for rash.  Neurological: Negative for headaches.  Hematological: Does not bruise/bleed easily.  Psychiatric/Behavioral: Negative for confusion.     Physical Exam Updated Vital Signs BP 143/92 (BP Location: Left Arm)   Pulse 80   Temp 98 F (36.7 C) (Oral)   Resp 15   Ht 5\' 7"  (1.702 m)   Wt (!) 145.2 kg    LMP 02/14/2014   SpO2 95%   BMI 50.15 kg/m   Physical Exam  Constitutional: She is oriented to person, place, and time. She appears well-developed and well-nourished. No distress.  HENT:  Head: Normocephalic and atraumatic.  Eyes: Conjunctivae are normal.  Neck: Normal range of motion.  Cardiovascular: Normal rate, regular rhythm and normal heart sounds.   Pulmonary/Chest: Effort normal and breath sounds normal.  Abdominal: Soft. Bowel sounds are normal. There is no tenderness.  Musculoskeletal: Normal range of motion.  Neurological: She is alert and oriented to person, place, and time. No cranial nerve deficit. She exhibits normal muscle tone. Coordination normal.  Skin: Skin is warm. No rash noted.  Nursing note and vitals reviewed.    ED Treatments / Results  Labs (all labs ordered are listed, but only abnormal results are displayed) Labs Reviewed  URINALYSIS, ROUTINE W REFLEX MICROSCOPIC (NOT AT [facility]) - Abnormal; Notable for the following:       Result Value   Specific Gravity, Urine >1.030 (*)    Hgb urine dipstick LARGE (*)    Protein, ur TRACE (*)    Nitrite POSITIVE (*)    Leukocytes, UA TRACE (*)    All other components within normal limits  URINE MICROSCOPIC-ADD ON - Abnormal; Notable for the following:    Squamous Epithelial / LPF 6-30 (*)    Bacteria, UA FEW (*)    Crystals CA OXALATE CRYSTALS (*)    All other components within normal limits  URINE CULTURE    EKG  EKG Interpretation None       Radiology No results found.  Procedures Procedures (including critical care time)  Medications Ordered in ED Medications  cephALEXin (KEFLEX) capsule 500 mg (not administered)     Initial Impression / Assessment and Plan / ED Course  I have reviewed the triage vital signs and the nursing notes.  Pertinent labs & imaging results that were available during my care of the patient were reviewed by me and considered in my medical decision making (see chart  for details).  Clinical Course    Patient symptoms consistent with urinary tract infection. Urinalysis consistent with urinary tract infection. Nitrite positive urine. Urine culture sent. We'll start treatment with Keflex. Patient will return if not improved in 2 days or for any new or worse symptoms. Patient without any complicating factors. No fever no nausea no vomiting no significant abdominal pain.  Final Clinical Impressions(s) / ED Diagnoses   Final diagnoses:  UTI (lower urinary tract infection)    New Prescriptions New Prescriptions   CEPHALEXIN (KEFLEX) 500 MG CAPSULE    Take 1 capsule (500 mg total) by mouth 4 (four) times daily.     Fredia Sorrow, MD 09/05/15 2107

## 2015-09-05 NOTE — ED Triage Notes (Signed)
Pt states she feels like she is having a difficult time urinating and when she does, it has blood in it. Pt also c/o generalized abdominal pain.

## 2015-09-09 LAB — URINE CULTURE: Culture: >=100000 — AB

## 2015-09-10 ENCOUNTER — Telehealth: Payer: Self-pay | Admitting: *Deleted

## 2015-09-10 NOTE — Telephone Encounter (Signed)
Post ED Visit - Positive Culture Follow-up  Culture report reviewed by antimicrobial stewardship pharmacist:  []  Elenor Quinones, Pharm.D. []  Heide Guile, Pharm.D., BCPS []  Parks Neptune, Pharm.D. []  Alycia Rossetti, Pharm.D., BCPS []  Dunlap, Pharm.D., BCPS, AAHIVP []  Legrand Como, Pharm.D., BCPS, AAHIVP []  Milus Glazier, Pharm.D. []  Stephens November, [place].D. Andria Meuse, RPh  Positive urine culture Treated with Cephalexin, organism sensitive to the same and no further patient follow-up is required at this time.  Harlon Flor [facility] 09/10/2015, 9:13 AM

## 2015-11-11 ENCOUNTER — Encounter: Payer: Self-pay | Admitting: *Deleted

## 2015-11-12 ENCOUNTER — Encounter: Payer: Self-pay | Admitting: Obstetrics and Gynecology

## 2015-11-23 ENCOUNTER — Encounter: Payer: Self-pay | Admitting: Obstetrics and Gynecology

## 2015-11-23 ENCOUNTER — Ambulatory Visit (INDEPENDENT_AMBULATORY_CARE_PROVIDER_SITE_OTHER): Payer: Medicaid Other | Admitting: Obstetrics and Gynecology

## 2015-11-23 VITALS — BP 138/88 | HR 72 | Wt 313.4 lb

## 2015-11-23 DIAGNOSIS — N938 Other specified abnormal uterine and vaginal bleeding: Secondary | ICD-10-CM

## 2015-11-23 DIAGNOSIS — N898 Other specified noninflammatory disorders of vagina: Secondary | ICD-10-CM | POA: Diagnosis not present

## 2015-11-23 DIAGNOSIS — N941 Unspecified dyspareunia: Secondary | ICD-10-CM | POA: Diagnosis not present

## 2015-11-23 NOTE — Progress Notes (Signed)
Patient ID: [name], female   DOB: 02-02-1980, 35 y.o.   MRN: [id number]    [facility] Visit  @DATE @            Patient name: Robin Martin MRN [id number]  Date of birth: [birth year]  CC & HPI:   Chief Complaint  Patient presents with  . Vaginal Bleeding    s/p hysterectomy 4/17, vaginal bleeding with intercourse and urination      Robin Martin is a 35 y.o. female presenting today for ongoing vaginal bleeding. Pt states the bleeding began when she was able to begin having intercourse after her LAVH in 03/2015. She also complains of dyspareunia.   ROS:  Review of Systems  Genitourinary:       +dyspareunia, vaginal bleeding    Pertinent History Reviewed:   Reviewed: Significant for total LAVH with bilateral salpingectomy, cesarean section  Medical         Past Medical History:  Diagnosis Date  . Abnormal Pap smear   . Bipolar 1 disorder ([place])   . Cancer ([place]) 2005   LEEP-cervical cancer  . Chronic kidney disease    kidney stone at age 59  . DDD (degenerative disc disease), lumbar   . Depression    post-partum depression  . GERD (gastroesophageal reflux disease)    h/o GERD- wt loss alleviated it  . Gestational diabetes mellitus, antepartum    with pregnancy  . Heartburn in pregnancy   . Herpes   . Hypertension   . Ovarian cyst    age 10  . Pregnancy induced hypertension   . Sciatic pain   . Scoliosis   . Tendonitis    both hands  . Vision problems                               Surgical Hx:    Past Surgical History:  Procedure Laterality Date  . CESAREAN SECTION     x2--2005 and 2014  . CESAREAN SECTION WITH BILATERAL TUBAL LIGATION Bilateral 04/09/2012   BTL not done  . CESAREAN SECTION WITH BILATERAL TUBAL LIGATION Bilateral 11/06/2014   Procedure: REPEAT CESAREAN SECTION WITH BILATERAL TUBAL LIGATION REMOVAL OF SKIN TAG;  Surgeon: Crawford Givens, MD;  Location: [facility];  Service: Obstetrics;  Laterality: Bilateral;  . CYSTOSCOPY N/A  04/24/2015   Procedure: CYSTOSCOPY;  Surgeon: Everett Graff, MD;  Location: [facility];  Service: Gynecology;  Laterality: N/A;  . LAPAROSCOPIC HYSTERECTOMY Bilateral 04/24/2015   Procedure: HYSTERECTOMY TOTAL LAPAROSCOPIC bilateral salpingectomy ;  Surgeon: Everett Graff, MD;  Location: [facility];  Service: Gynecology;  Laterality: Bilateral;  . LEEP     Medications: Reviewed & Updated - see associated section                       Current Outpatient Prescriptions:  .  atorvastatin (LIPITOR) 10 MG tablet, Take 10 mg by mouth daily., Disp: , Rfl:  .  lisinopril-hydrochlorothiazide (PRINZIDE,ZESTORETIC) 10-12.5 MG tablet, Take 1 tablet by mouth daily., Disp: , Rfl:  .  ranitidine (ZANTAC) 150 MG tablet, Take 150 mg by mouth 2 (two) times daily., Disp: , Rfl:    Social History: Reviewed -  reports that she quit smoking about 3 years ago. Her smoking use included Cigarettes. She smoked 0.50 packs per day. She has never used smokeless tobacco.  Objective Findings:  Vitals: Blood pressure 138/88, pulse 72, weight Marland Kitchen)  313 lb 6.4 oz (142.2 kg), last menstrual period 02/14/2014, unknown if currently breastfeeding.  Physical Examination: General appearance - alert, well appearing, and in no distress Mental status - alert, oriented to person, place, and time Abdomen - soft, nontender, nondistended, no masses or organomegaly Pelvic -  VULVA: normal appearing vulva with no masses, tenderness or lesions,  VAGINA: normal appearing vagina with normal color, no lesions, yellowish discharge present in the vaginal vault, large snowman speculum necessary to visualize cervix, 1 cm x 8 mm polypoid of granulation tissue from cuff; difficult to see d/t patient anatomy. Partially excised and partially coated with AgNO3. Specimen not sent to pathology.  Cervix, Uterus, Adnexa surgically absent.   GRANULATION TISSUE REMOVAL PROCEDURE NOTE The patient's identification was confirmed and consent was obtained. This  procedure was performed by Jonnie Kind at 9:44 AM Site & Appearance: 1 cm x 8 mm polypoid of granulation tissue from cuff Sterile procedures observed Anesthetic used: hurricane spray  AgNO3 applied  Skin tag excised under local anesthesia, site covered with dry, sterile dressing.  Pts tissues significantly tender surrounding polyp. Patient tolerated procedure poorly with vasovagal response.  Minimal blood loss. Instructions for care discussed verbally and patient provided with feminine hygiene product  additional written instructions for homecare and f/u.    Assessment & Plan:   A:  1. Granulation tissue s/p LAVH 03/2015  2. Half of polyp excised in office, remaining half coated in AgNO3 3. Leukorrhea; Wet prep collected: moderate clue cells, moderate white cells, no tric    P:  1. Rx Metronidazole tablets  2. Bimanual exam at followup 2. F/u in 2-3 weeks for recheck , will need topical anesthetic if further biopsy excision of granulation tissue.    By signing my name below, I, Robin Martin, attest that this documentation has been prepared under the direction and in the presence of Jonnie Kind, MD. Electronically Signed: Hansel Martin, ED Scribe. 11/23/15. 9:35 AM.  `````Attestation of Attending Supervision of Advanced Practitioner: Evaluation and management procedures were performed by the PA/NP/CNM/OB Fellow under my supervision/collaboration. Chart reviewed and agree with management and plan.  Robin Martin 11/23/2015 1:08 PM    s

## 2015-11-24 ENCOUNTER — Telehealth: Payer: Self-pay | Admitting: Obstetrics and Gynecology

## 2015-11-24 DIAGNOSIS — B9689 Other specified bacterial agents as the cause of diseases classified elsewhere: Secondary | ICD-10-CM

## 2015-11-24 DIAGNOSIS — N760 Acute vaginitis: Principal | ICD-10-CM

## 2015-11-24 NOTE — Telephone Encounter (Signed)
Pt called stating that She came into our office to see Dr.Ferguson Yesterday and he was suppose to have called in a medication. Pt states that she called her pharmacy and they don't have it. Please contact pt

## 2015-11-25 MED ORDER — METRONIDAZOLE 500 MG PO TABS: 500.0000 mg | ORAL_TABLET | Freq: Three times a day (TID) | ORAL | 1 refills | Status: DC

## 2015-11-25 NOTE — Telephone Encounter (Signed)
Pt states had an appt with Dr.Ferguson on 10/30 was going to send Rx for a bacterial infection. Rx for Metronidazole is not at pt pharmacy.   Please advise.

## 2015-12-10 ENCOUNTER — Ambulatory Visit (INDEPENDENT_AMBULATORY_CARE_PROVIDER_SITE_OTHER): Payer: Medicaid Other | Admitting: Obstetrics and Gynecology

## 2015-12-10 ENCOUNTER — Encounter: Payer: Self-pay | Admitting: Obstetrics and Gynecology

## 2015-12-10 DIAGNOSIS — Z90710 Acquired absence of both cervix and uterus: Secondary | ICD-10-CM

## 2015-12-10 DIAGNOSIS — N898 Other specified noninflammatory disorders of vagina: Secondary | ICD-10-CM

## 2015-12-10 DIAGNOSIS — N938 Other specified abnormal uterine and vaginal bleeding: Secondary | ICD-10-CM | POA: Diagnosis not present

## 2015-12-10 NOTE — Progress Notes (Signed)
[facility] Visit  12/10/15            Patient name: Robin Martin MRN [id number]  Date of birth: 01-27-1980  CC & HPI:  Robin Martin is a 35 y.o. female presenting today for intermittent vaginal bleeding. Pt states that she had a hysterectomy completed 10 months ago with intermittent vaginal bleeding. Pt was also seen in the office one month ago and had some granulation tissue removed. Pt notes that she has associated symptoms of resolved dyspareunia. Pt hasn't tried any medications for the relief of her symptoms. Pt denies any other symptoms at this time.  ROS:  Review of Systems  Genitourinary:       + Intermittent vaginal bleeding.  +resolved dyspareunia     Pertinent History Reviewed:   Reviewed: Significant for cervical CA, HTN, Medical         Past Medical History:  Diagnosis Date  . Abnormal Pap smear   . Bipolar 1 disorder ([place])   . Cancer ([place]) 2005   LEEP-cervical cancer  . Chronic kidney disease    kidney stone at age 68  . DDD (degenerative disc disease), lumbar   . Depression    post-partum depression  . GERD (gastroesophageal reflux disease)    h/o GERD- wt loss alleviated it  . Gestational diabetes mellitus, antepartum    with pregnancy  . Heartburn in pregnancy   . Herpes   . Hypertension   . Ovarian cyst    age 80  . Pregnancy induced hypertension   . Sciatic pain   . Scoliosis   . Tendonitis    both hands  . Vision problems                               Surgical Hx:    Past Surgical History:  Procedure Laterality Date  . CESAREAN SECTION     x2--2005 and 2014  . CESAREAN SECTION WITH BILATERAL TUBAL LIGATION Bilateral 04/09/2012   BTL not done  . CESAREAN SECTION WITH BILATERAL TUBAL LIGATION Bilateral 11/06/2014   Procedure: REPEAT CESAREAN SECTION WITH BILATERAL TUBAL LIGATION REMOVAL OF SKIN TAG;  Surgeon: Crawford Givens, MD;  Location: [facility];  Service: Obstetrics;  Laterality: Bilateral;  . CYSTOSCOPY N/A 04/24/2015   Procedure: CYSTOSCOPY;  Surgeon: Everett Graff, MD;  Location: [facility];  Service: Gynecology;  Laterality: N/A;  . LAPAROSCOPIC HYSTERECTOMY Bilateral 04/24/2015   Procedure: HYSTERECTOMY TOTAL LAPAROSCOPIC bilateral salpingectomy ;  Surgeon: Everett Graff, MD;  Location: [facility];  Service: Gynecology;  Laterality: Bilateral;  . LEEP     Medications: Reviewed & Updated - see associated section                       Current Outpatient Prescriptions:  .  atorvastatin (LIPITOR) 10 MG tablet, Take 10 mg by mouth daily., Disp: , Rfl:  .  glyBURIDE (DIABETA) 2.5 MG tablet, Take 2.5 mg by mouth daily with breakfast., Disp: , Rfl:  .  lisinopril-hydrochlorothiazide (PRINZIDE,ZESTORETIC) 10-12.5 MG tablet, Take 1 tablet by mouth daily., Disp: , Rfl:  .  ranitidine (ZANTAC) 150 MG tablet, Take 150 mg by mouth 2 (two) times daily., Disp: , Rfl:  .  metroNIDAZOLE (FLAGYL) 500 MG tablet, Take 1 tablet (500 mg total) by mouth 3 (three) times daily. (Patient not taking: Reported on 12/10/2015), Disp: 21 tablet, Rfl: 1   Social History: Reviewed -  reports that she quit smoking about 3 years ago. Her smoking use included Cigarettes. She smoked 0.50 packs per day. She has never used smokeless tobacco.  Objective Findings:  Vitals: Blood pressure 134/70, pulse 76, height 5\' 7"  (1.702 m), weight (!) 311 lb (141.1 kg), last menstrual period 02/14/2014, unknown if currently breastfeeding.  Physical Examination: Pelvic - normal external genitalia, vulva, vagina, cervix, uterus and adnexa,  VULVA: normal appearing vulva with no masses, tenderness or lesions,  VAGINA: normal appearing vagina with normal color and discharge, no lesions, good vaginal length, non-tender CERVIX: normal appearing cervix without discharge or lesions, surgically absent, UTERUS: surgically absent, vaginal cuff well healed, and nontender. ADNEXA: normal adnexa in size, nontender and no masses   Assessment & Plan:   A:  1. Resolved  granulation tissue, full healing s/p hysterectomy.   P:  1. Follow up PRN  By signing my name below, I, Soijett Blue, attest that this documentation has been prepared under the direction and in the presence of Jonnie Kind, MD. Electronically Signed: Soijett Blue, ED Scribe. 12/10/15. 10:36 AM.  I personally performed the services described in this documentation, which was SCRIBED in my presence. The recorded information has been reviewed and considered accurate. It has been edited as necessary during review. Jonnie Kind, MD

## 2015-12-14 DIAGNOSIS — N898 Other specified noninflammatory disorders of vagina: Secondary | ICD-10-CM | POA: Insufficient documentation

## 2016-02-22 ENCOUNTER — Encounter: Payer: Medicaid Other | Admitting: Podiatry

## 2016-02-29 ENCOUNTER — Ambulatory Visit: Payer: Medicaid Other | Admitting: Podiatry

## 2016-03-14 ENCOUNTER — Ambulatory Visit (INDEPENDENT_AMBULATORY_CARE_PROVIDER_SITE_OTHER): Payer: 59 | Admitting: Podiatry

## 2016-03-14 ENCOUNTER — Encounter: Payer: Self-pay | Admitting: Podiatry

## 2016-03-14 ENCOUNTER — Ambulatory Visit (INDEPENDENT_AMBULATORY_CARE_PROVIDER_SITE_OTHER): Payer: Medicaid Other

## 2016-03-14 DIAGNOSIS — M722 Plantar fascial fibromatosis: Secondary | ICD-10-CM | POA: Diagnosis not present

## 2016-03-14 DIAGNOSIS — B353 Tinea pedis: Secondary | ICD-10-CM

## 2016-03-14 DIAGNOSIS — M79672 Pain in left foot: Secondary | ICD-10-CM

## 2016-03-14 MED ORDER — METHYLPREDNISOLONE 4 MG PO TBPK: ORAL_TABLET | ORAL | 0 refills | Status: DC

## 2016-03-14 MED ORDER — MELOXICAM 15 MG PO TABS: 15.0000 mg | ORAL_TABLET | Freq: Every day | ORAL | 1 refills | Status: AC

## 2016-03-14 MED ORDER — CLOTRIMAZOLE-BETAMETHASONE 1-0.05 % EX CREA: 1.0000 "application " | TOPICAL_CREAM | Freq: Two times a day (BID) | CUTANEOUS | 1 refills | Status: DC

## 2016-03-14 MED ORDER — TERBINAFINE HCL 250 MG PO TABS: 250.0000 mg | ORAL_TABLET | Freq: Every day | ORAL | 0 refills | Status: DC

## 2016-03-20 MED ORDER — BETAMETHASONE SOD PHOS & ACET 6 (3-3) MG/ML IJ SUSP: 3.0000 mg | Freq: Once | INTRAMUSCULAR | Status: DC

## 2016-03-20 NOTE — Progress Notes (Signed)
   Subjective: Patient presents today as a new patient for evaluation of multiple planes regarding the left foot. She has a history of diabetes mellitus. Patient states that for the past month she has developed a rash and blisters on the weightbearing surface of her left foot. Patient states that it itches. Patient has also had heel pain to the left foot for the past 6 months. Patient denies trauma. No alleviating factors other than rest.  Objective: Physical Exam General: The patient is alert and oriented x3 in no acute distress.  Dermatology: Pruritus noted to the left foot with hyperkeratotic xerotic skin. There are pinpoint diffuse blisters noted throughout the weightbearing surfaces the left foot consistent with a T. Mentagrophytes. Skin is warm, dry and supple bilateral lower extremities. Negative for open lesions or macerations bilateral.   Vascular: Dorsalis Pedis and Posterior Tibial pulses palpable bilateral.  Capillary fill time is immediate to all digits.  Neurological: Epicritic and protective threshold intact bilateral.   Musculoskeletal: Tenderness to palpation at the medial calcaneal tubercale and through the insertion of the plantar fascia of the left foot. All other joints range of motion within normal limits bilateral. Strength 5/5 in all groups bilateral.   Radiographic exam:   Normal osseous mineralization. Joint spaces preserved. No fracture/dislocation/boney destruction. Calcaneal spur present with mild thickening of plantar fascia left. No other soft tissue abnormalities or radiopaque foreign bodies.   Assessment: 1. Plantar fasciitis left foot 2. Tinea pedis left foot (T Mentagrophytes)  Plan of Care:   1. Patient evaluated. Xrays reviewed.   2. Injection of 0.5cc Celestone soluspan injected into the left plantar fascia.  3. Instructed patient regarding therapies and modalities at home to alleviate symptoms.  4. Rx for meloxicam 15mg  PO given to patient.  5.  Prescription for Medrol Dosepak  6. Prescription for Lamisil 250 mg #28 7. Prescription for Lotrisone cream 8. Medical note was given to the patient to be able to wear sneakers at work 9. Return to clinic in 4 weeks   Edrick Kins, DPM [facility]  Dr. Edrick Kins, [address]                                        [place], [place] [address]                Office [phone]  Fax [phone]

## 2016-04-11 ENCOUNTER — Ambulatory Visit: Payer: Medicaid Other | Admitting: Podiatry

## 2016-04-25 ENCOUNTER — Encounter: Payer: Self-pay | Admitting: Podiatry

## 2016-04-25 ENCOUNTER — Ambulatory Visit (INDEPENDENT_AMBULATORY_CARE_PROVIDER_SITE_OTHER): Payer: 59 | Admitting: Podiatry

## 2016-04-25 DIAGNOSIS — B353 Tinea pedis: Secondary | ICD-10-CM | POA: Diagnosis not present

## 2016-04-25 DIAGNOSIS — M722 Plantar fascial fibromatosis: Secondary | ICD-10-CM | POA: Diagnosis not present

## 2016-04-25 NOTE — Progress Notes (Signed)
Subjective: Patient presents today for follow-up treatment and evaluation of plantar fasciitis to the left foot as well as tinea pedis to the left foot. Patient states that she no longer has pain in her left foot and the athlete's foot is gone. She states that the oral antifungal as well as topical helped tremendously. She also believes the injection helped tremendously.  Objective: Skin noted to the bilateral feet appear healthy and viable without any sign of dermatitis or tinea pedis. Negative for any pain on palpation noted to the plantar fascia of the left foot.  Assessment: 1. Plantar fasciitis left foot-resolved 2. Tinea pedis left foot-resolved  Plan of care: Today the patient was evaluated. No intervention needed today. Return to clinic when necessary.  Edrick Kins, DPM [facility]  Dr. Edrick Kins, [address]                                        [place], [place] [address]                Office [phone]  Fax [phone]

## 2016-04-26 NOTE — Progress Notes (Signed)
This encounter was created in error - please disregard.

## 2017-01-09 ENCOUNTER — Ambulatory Visit (INDEPENDENT_AMBULATORY_CARE_PROVIDER_SITE_OTHER): Payer: 59 | Admitting: Otolaryngology

## 2017-01-09 DIAGNOSIS — H906 Mixed conductive and sensorineural hearing loss, bilateral: Secondary | ICD-10-CM

## 2017-01-23 ENCOUNTER — Other Ambulatory Visit (INDEPENDENT_AMBULATORY_CARE_PROVIDER_SITE_OTHER): Payer: Self-pay | Admitting: Otolaryngology

## 2017-01-23 DIAGNOSIS — H9011 Conductive hearing loss, unilateral, right ear, with unrestricted hearing on the contralateral side: Secondary | ICD-10-CM

## 2017-01-30 ENCOUNTER — Ambulatory Visit (HOSPITAL_COMMUNITY)
Admission: RE | Admit: 2017-01-30 | Discharge: 2017-01-30 | Disposition: A | Discharge status: Home / Self Care | Payer: 59 | Source: Ambulatory Visit | Attending: Otolaryngology | Admitting: Otolaryngology

## 2017-01-30 DIAGNOSIS — H9011 Conductive hearing loss, unilateral, right ear, with unrestricted hearing on the contralateral side: Secondary | ICD-10-CM | POA: Diagnosis present

## 2017-02-02 ENCOUNTER — Ambulatory Visit (INDEPENDENT_AMBULATORY_CARE_PROVIDER_SITE_OTHER): Payer: 59 | Admitting: Otolaryngology

## 2017-02-06 ENCOUNTER — Ambulatory Visit (INDEPENDENT_AMBULATORY_CARE_PROVIDER_SITE_OTHER): Payer: 59 | Admitting: Otolaryngology

## 2017-02-06 DIAGNOSIS — H900 Conductive hearing loss, bilateral: Secondary | ICD-10-CM | POA: Diagnosis not present

## 2017-02-06 DIAGNOSIS — H6983 Other specified disorders of Eustachian tube, bilateral: Secondary | ICD-10-CM

## 2017-02-06 DIAGNOSIS — H6523 Chronic serous otitis media, bilateral: Secondary | ICD-10-CM

## 2017-05-04 ENCOUNTER — Ambulatory Visit (INDEPENDENT_AMBULATORY_CARE_PROVIDER_SITE_OTHER): Payer: 59 | Admitting: Otolaryngology

## 2017-05-04 DIAGNOSIS — H6983 Other specified disorders of Eustachian tube, bilateral: Secondary | ICD-10-CM

## 2017-05-04 DIAGNOSIS — H7203 Central perforation of tympanic membrane, bilateral: Secondary | ICD-10-CM

## 2017-05-11 LAB — HEMOGLOBIN A1C: Hemoglobin A1C: 7.7

## 2017-05-11 LAB — LIPID PANEL
Cholesterol: 128 (ref 0–200)
HDL: 30 — AB (ref 35–70)
LDL Cholesterol: 67
Triglycerides: 155 (ref 40–160)

## 2017-05-11 LAB — BASIC METABOLIC PANEL
BUN: 12 (ref 4–21)
Creatinine: 0.7 (ref <=1.1)

## 2017-07-24 ENCOUNTER — Ambulatory Visit (INDEPENDENT_AMBULATORY_CARE_PROVIDER_SITE_OTHER): Payer: 59 | Admitting: "Endocrinology

## 2017-07-24 ENCOUNTER — Encounter: Payer: Self-pay | Admitting: "Endocrinology

## 2017-07-24 VITALS — BP 114/79 | HR 94 | Ht 67.5 in | Wt 306.0 lb

## 2017-07-24 DIAGNOSIS — E1165 Type 2 diabetes mellitus with hyperglycemia: Secondary | ICD-10-CM | POA: Diagnosis not present

## 2017-07-24 DIAGNOSIS — I10 Essential (primary) hypertension: Secondary | ICD-10-CM | POA: Diagnosis not present

## 2017-07-24 DIAGNOSIS — E782 Mixed hyperlipidemia: Secondary | ICD-10-CM | POA: Diagnosis not present

## 2017-07-24 MED ORDER — ACCU-CHEK SOFTCLIX LANCETS MISC: 2 refills | Status: DC

## 2017-07-24 MED ORDER — GLUCOSE BLOOD VI STRP: ORAL_STRIP | 2 refills | Status: DC

## 2017-07-24 NOTE — Patient Instructions (Signed)

## 2017-07-24 NOTE — Progress Notes (Signed)
Endocrinology Consult Note       07/24/2017, 5:13 PM   Subjective:    Patient ID: Robin Martin, female    DOB: Jun 03, 1980.  Robin Martin is being seen in consultation for management of currently uncontrolled symptomatic diabetes requested by  [facility].   Past Medical History:  Diagnosis Date  . Abnormal Pap smear   . Bipolar 1 disorder ([place])   . Cancer ([place]) 2005   LEEP-cervical cancer  . Chronic kidney disease    kidney stone at age 12  . DDD (degenerative disc disease), lumbar   . Depression    post-partum depression  . GERD (gastroesophageal reflux disease)    h/o GERD- wt loss alleviated it  . Gestational diabetes mellitus, antepartum    with pregnancy  . Heartburn in pregnancy   . Herpes   . Hypertension   . Ovarian cyst    age 16  . Pregnancy induced hypertension   . Sciatic pain   . Scoliosis   . Tendonitis    both hands  . Vision problems    Past Surgical History:  Procedure Laterality Date  . CESAREAN SECTION     x2--2005 and 2014  . CESAREAN SECTION WITH BILATERAL TUBAL LIGATION Bilateral 04/09/2012   BTL not done  . CESAREAN SECTION WITH BILATERAL TUBAL LIGATION Bilateral 11/06/2014   Procedure: REPEAT CESAREAN SECTION WITH BILATERAL TUBAL LIGATION REMOVAL OF SKIN TAG;  Surgeon: Crawford Givens, MD;  Location: [facility];  Service: Obstetrics;  Laterality: Bilateral;  . CYSTOSCOPY N/A 04/24/2015   Procedure: CYSTOSCOPY;  Surgeon: Everett Graff, MD;  Location: [facility];  Service: Gynecology;  Laterality: N/A;  . LAPAROSCOPIC HYSTERECTOMY Bilateral 04/24/2015   Procedure: HYSTERECTOMY TOTAL LAPAROSCOPIC bilateral salpingectomy ;  Surgeon: Everett Graff, MD;  Location: [facility];  Service: Gynecology;  Laterality: Bilateral;  . LEEP     Social History   Socioeconomic History  . Marital status: Married    Spouse name: Not on file  . Number of  children: Not on file  . Years of education: Not on file  . Highest education level: Not on file  Occupational History  . Not on file  Social Needs  . Financial resource strain: Not on file  . Food insecurity:    Worry: Not on file    Inability: Not on file  . Transportation needs:    Medical: Not on file    Non-medical: Not on file  Tobacco Use  . Smoking status: Former Smoker    Packs/day: 0.50    Types: Cigarettes    Last attempt to quit: 03/22/2012    Years since quitting: 5.3  . Smokeless tobacco: Never Used  Substance and Sexual Activity  . Alcohol use: No    Comment: occasional  . Drug use: No  . Sexual activity: Not Currently    Birth control/protection: Surgical  Lifestyle  . Physical activity:    Days per week: Not on file    Minutes per session: Not on file  . Stress: Not on file  Relationships  . Social connections:    Talks on phone: Not on  file    Gets together: Not on file    Attends religious service: Not on file    Active member of club or organization: Not on file    Attends meetings of clubs or organizations: Not on file    Relationship status: Not on file  Other Topics Concern  . Not on file  Social History Narrative  . Not on file   Outpatient Encounter Medications as of 07/24/2017  Medication Sig  . atorvastatin (LIPITOR) 10 MG tablet Take 10 mg by mouth daily.  Marland Kitchen glipiZIDE (GLUCOTROL) 10 MG tablet Take 10 mg by mouth 2 (two) times daily with a meal.  . lisinopril-hydrochlorothiazide (PRINZIDE,ZESTORETIC) 10-12.5 MG tablet Take 1 tablet by mouth daily.  . ranitidine (ZANTAC) 150 MG tablet Take 150 mg by mouth 2 (two) times daily.  Marland Kitchen ACCU-CHEK SOFTCLIX LANCETS lancets Use as instructed  . glucose blood (ACCU-CHEK GUIDE) test strip Use as instructed  . [DISCONTINUED] clotrimazole-betamethasone (LOTRISONE) cream Apply 1 application topically 2 (two) times daily.  . [DISCONTINUED] glyBURIDE (DIABETA) 2.5 MG tablet Take 2.5 mg by mouth daily with  breakfast.  . [DISCONTINUED] methylPREDNISolone (MEDROL DOSEPAK) 4 MG TBPK tablet 6 day dose pack - take as directed  . [DISCONTINUED] metroNIDAZOLE (FLAGYL) 500 MG tablet Take 1 tablet (500 mg total) by mouth 3 (three) times daily. (Patient not taking: Reported on 12/10/2015)  . [DISCONTINUED] terbinafine (LAMISIL) 250 MG tablet Take 1 tablet (250 mg total) by mouth daily.  . [DISCONTINUED] betamethasone acetate-betamethasone sodium phosphate (CELESTONE) injection 3 mg    No facility-administered encounter medications on file as of 07/24/2017.     ALLERGIES: Allergies  Allergen Reactions  . Chocolate Other (See Comments)    Thins blood  . Latex Rash  . Morphine And Related Itching, Nausea And Vomiting and Rash    VACCINATION STATUS: There is no immunization history for the selected administration types on file for this patient.  Diabetes  She presents for her initial diabetic visit. She has type 2 diabetes mellitus. Her disease course has been worsening. There are no hypoglycemic associated symptoms. Pertinent negatives for hypoglycemia include no confusion, headaches, pallor or seizures. Associated symptoms include fatigue, polydipsia and polyuria. Pertinent negatives for diabetes include no chest pain and no polyphagia. There are no hypoglycemic complications. Symptoms are worsening. There are no diabetic complications. Risk factors for coronary artery disease include diabetes mellitus, dyslipidemia, hypertension, obesity, sedentary lifestyle and family history. Current diabetic treatment includes oral agent (monotherapy). Her weight is decreasing steadily. She is following a generally unhealthy diet. When asked about meal planning, she reported none. She has had a previous visit with a dietitian. She participates in exercise intermittently. (Did not bring any meter nor logs to review today.  Her most recent labs show A1c of 7.7%) An ACE inhibitor/angiotensin II receptor blocker is being  taken. She does not see a podiatrist.Eye exam is not current.  Hyperlipidemia  This is a chronic problem. The current episode started more than 1 year ago. The problem is controlled. Exacerbating diseases include diabetes and obesity. Pertinent negatives include no chest pain, myalgias or shortness of breath. Current antihyperlipidemic treatment includes statins. Risk factors for coronary artery disease include diabetes mellitus, dyslipidemia, family history, obesity, hypertension and a sedentary lifestyle.  Hypertension  This is a chronic problem. The current episode started more than 1 year ago. Pertinent negatives include no chest pain, headaches, palpitations or shortness of breath. Risk factors for coronary artery disease include dyslipidemia, diabetes mellitus, obesity and sedentary  lifestyle. Past treatments include ACE inhibitors.      Review of Systems  Constitutional: Positive for fatigue. Negative for chills, fever and unexpected weight change.  HENT: Negative for trouble swallowing and voice change.   Eyes: Negative for visual disturbance.  Respiratory: Negative for cough, shortness of breath and wheezing.   Cardiovascular: Negative for chest pain, palpitations and leg swelling.  Gastrointestinal: Negative for diarrhea, nausea and vomiting.  Endocrine: Positive for polydipsia and polyuria. Negative for cold intolerance, heat intolerance and polyphagia.  Musculoskeletal: Negative for arthralgias and myalgias.  Skin: Negative for color change, pallor, rash and wound.  Neurological: Negative for seizures and headaches.  Psychiatric/Behavioral: Negative for confusion and suicidal ideas.    Objective:    BP 114/79   Pulse 94   Ht 5' 7.5" (1.715 m)   Wt (!) 306 lb (138.8 kg)   LMP 02/14/2014   BMI 47.22 kg/m   Wt Readings from Last 3 Encounters:  07/24/17 (!) 306 lb (138.8 kg)  12/10/15 (!) 311 lb (141.1 kg)  11/23/15 (!) 313 lb 6.4 oz (142.2 kg)     Physical Exam   Constitutional: She is oriented to person, place, and time. She appears well-developed.  HENT:  Head: Normocephalic and atraumatic.  Eyes: EOM are normal.  Neck: Normal range of motion. Neck supple. No tracheal deviation present. No thyromegaly present.  Cardiovascular: Normal rate and regular rhythm.  Pulmonary/Chest: Effort normal and breath sounds normal.  Abdominal: Soft. Bowel sounds are normal. There is no tenderness. There is no guarding.  Musculoskeletal: Normal range of motion. She exhibits no edema.  Neurological: She is alert and oriented to person, place, and time. She has normal reflexes. No cranial nerve deficit. Coordination normal.  Skin: Skin is warm and dry. No rash noted. No erythema. No pallor.  Psychiatric: She has a normal mood and affect. Judgment normal.    CMP ( most recent) CMP     Component Value Date/Time   NA 140 04/22/2015 1610   K 3.9 04/22/2015 1610   CL 104 04/22/2015 1610   CO2 28 04/22/2015 1610   GLUCOSE 179 (H) 04/22/2015 1610   BUN 12 05/11/2017   CREATININE 0.7 05/11/2017   CREATININE 0.62 04/22/2015 1610   CALCIUM 8.6 (L) 04/22/2015 1610   PROT 7.4 11/18/2014 1920   ALBUMIN 3.4 (L) 11/18/2014 1920   AST 23 11/18/2014 1920   ALT 22 11/18/2014 1920   ALKPHOS 102 11/18/2014 1920   BILITOT 0.9 11/18/2014 1920   GFRNONAA >60 04/22/2015 1610   GFRAA >60 04/22/2015 1610   Recent Results (from the past 2160 hour(s))  Basic metabolic panel     Status: None   Collection Time: 05/11/17 12:00 AM  Result Value Ref Range   BUN 12 4 - 21   Creatinine 0.7 0.5 - 1.1  Lipid panel     Status: Abnormal   Collection Time: 05/11/17 12:00 AM  Result Value Ref Range   Triglycerides 155 40 - 160   Cholesterol 128 0 - 200   HDL 30 (A) 35 - 70   LDL Cholesterol 67   Hemoglobin A1c     Status: None   Collection Time: 05/11/17 12:00 AM  Result Value Ref Range   Hemoglobin A1C 7.7       Assessment & Plan:   1. Uncontrolled type 2 diabetes  mellitus with hyperglycemia (HCC)  - Robin Martin has currently uncontrolled symptomatic type 2 DM since 37 years of age,  with most  recent A1c of 7.7 %. Recent labs reviewed.  -her diabetes is complicated by obesity/sedentary life and Robin Martin remains at a high risk for more acute and chronic complications which include CAD, CVA, CKD, retinopathy, and neuropathy. These are all discussed in detail with the patient.  - I have counseled her on diet management and weight loss, by adopting a carbohydrate restricted/protein rich diet.  - Suggestion is made for her to avoid simple carbohydrates  from her diet including Cakes, Sweet Desserts, Ice Cream, Soda (diet and regular), Sweet Tea, Candies, Chips, Cookies, Store Bought Juices, Alcohol in Excess of  1-2 drinks a day, Artificial Sweeteners, and "Sugar-free" Products. This will help patient to have stable blood glucose profile and potentially avoid unintended weight gain.  - I encouraged her to switch to  unprocessed or minimally processed complex starch and increased protein intake (animal or plant source), fruits, and vegetables.  - she is advised to stick to a routine mealtimes to eat 3 meals  a day and avoid unnecessary snacks ( to snack only to correct hypoglycemia).   - she will be scheduled with Jearld Fenton, RDN, CDE for individualized diabetes education.  - I have approached her with the following individualized plan to manage diabetes and patient agrees:   -Her A1c 7.7% close to target, she would have benefited more from metformin treatment, however she insists that she does not tolerate metformin and wishes to stay on glipizide. -I advised her to lower her glipizide dose to 10 mg p.o. twice daily with breakfast and supper, initiate monitoring of blood glucose 2 times a day-daily before breakfast and at bedtime.  -Patient is encouraged to call clinic for blood glucose levels less than 70 or above 300 mg /dl.  -She has  several options to help her control diabetes including basal insulin. - she will be considered for incretin therapy as appropriate next visit.   - Patient specific target  A1c;  LDL, HDL, Triglycerides, and  Waist Circumference were discussed in detail.  2) BP/HTN: Her blood pressure is controlled to target. Continue current medications including lisinopril/HCTZ 10/12.5 mg p.o. Daily.  3) Lipids/HPL:   Her recent lipid panel showed controlled LDL at 67.  She is advised to continue atorvastatin 10 mg p.o. Nightly.  4)  Weight/Diet: She is currently seeking preliminary work-up for bariatric surgery.  She is encouraged to do new.  She is following with a dietitian at the bariatric team  , exercise, and detailed carbohydrates information provided.  5) Chronic Care/Health Maintenance:  -she  is on ACEI/ARB and Statin medications and  is encouraged to continue to follow up with Ophthalmology, Dentist,  Podiatrist at least yearly or according to recommendations, and advised to   stay away from smoking. I have recommended yearly flu vaccine and pneumonia vaccination at least every 5 years; moderate intensity exercise for up to 150 minutes weekly; and  sleep for at least 7 hours a day.  - I advised patient to maintain close follow up with [facility] for primary care needs.  - Time spent with the patient: 45 minutes, of which >50% was spent in obtaining information about her symptoms, reviewing her previous labs, evaluations, and treatments, counseling her about her currently uncontrolled type 2 diabetes, hypertension, obesity, and developing developing  plans for long term treatment based on the latest recommendations.  Robin Martin participated in the discussions, expressed understanding, and voiced agreement with the above plans.  All questions were answered to  her satisfaction. she is encouraged to contact clinic should she have any questions or concerns prior to her  return visit.  Follow up plan: - Return in about 5 weeks (around 08/28/2017) for follow up with pre-visit labs, meter, and logs.  Glade Lloyd, MD [facility] [address] [place], [place] [address] Phone: [phone]  Fax: [phone]    07/24/2017, 5:13 PM  This note was partially dictated with voice recognition software. Similar sounding words can be transcribed inadequately or may not  be corrected upon review.

## 2017-07-25 ENCOUNTER — Other Ambulatory Visit: Payer: Self-pay

## 2017-07-25 MED ORDER — ACCU-CHEK SOFTCLIX LANCETS MISC: 5 refills | Status: DC

## 2017-07-25 MED ORDER — GLUCOSE BLOOD VI STRP: ORAL_STRIP | 5 refills | Status: AC

## 2017-08-02 ENCOUNTER — Other Ambulatory Visit: Payer: Self-pay

## 2017-08-02 MED ORDER — ACCU-CHEK FASTCLIX LANCETS MISC: 1.0000 | Freq: Four times a day (QID) | 5 refills | Status: AC

## 2017-08-28 ENCOUNTER — Ambulatory Visit: Payer: 59 | Admitting: "Endocrinology

## 2017-10-03 ENCOUNTER — Encounter: Payer: Self-pay | Admitting: "Endocrinology

## 2017-10-03 ENCOUNTER — Ambulatory Visit: Payer: 59 | Admitting: "Endocrinology

## 2017-12-11 ENCOUNTER — Ambulatory Visit (INDEPENDENT_AMBULATORY_CARE_PROVIDER_SITE_OTHER): Payer: Self-pay | Admitting: Otolaryngology

## 2018-03-19 ENCOUNTER — Ambulatory Visit (INDEPENDENT_AMBULATORY_CARE_PROVIDER_SITE_OTHER): Payer: BLUE CROSS/BLUE SHIELD | Admitting: Obstetrics and Gynecology

## 2018-03-19 ENCOUNTER — Encounter: Payer: Self-pay | Admitting: Obstetrics and Gynecology

## 2018-03-19 ENCOUNTER — Encounter (INDEPENDENT_AMBULATORY_CARE_PROVIDER_SITE_OTHER): Payer: Self-pay

## 2018-03-19 VITALS — BP 153/100 | HR 85 | Ht 67.0 in | Wt 308.7 lb

## 2018-03-19 DIAGNOSIS — Z0000 Encounter for general adult medical examination without abnormal findings: Secondary | ICD-10-CM

## 2018-03-19 DIAGNOSIS — Z01419 Encounter for gynecological examination (general) (routine) without abnormal findings: Secondary | ICD-10-CM | POA: Diagnosis not present

## 2018-03-19 NOTE — Patient Instructions (Signed)

## 2018-03-19 NOTE — Progress Notes (Addendum)
Patient ID: Robin Martin, female   DOB: [birth year], 38 y.o.   MRN: [id number]  Assessment:  Women wellness annual Gyn Exam Diabetes htn hypercholesterol Discussion of app to aid in weight loss Plan:  1. pap smear NOT  Done 2. Return 3 yr  or prn 3. Annual mammogram advised after age 42 4. Continue F/u with PCP for diabetes Subjective:  Robin Martin is a 38 y.o. female [phone] who presents for annual exam. Patient's last menstrual period was 02/14/2014. The patient has complaints today of none. Had [facility] done by Dr. Mancel Bale. Had tubal after 4th child and had continuous bleeding and decided to have hysterectomy.   BP is high today and is unsure as to why but is not overly concerned. 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 Has Primary care who manages her diabetes Dr. Rosealee Albee, [facility]. Maternal aunt had breast cancer.   The following portions of the patient's history were reviewed and updated as appropriate: allergies, current medications, past family history, past medical history, past social history, past surgical history and problem list. Past Medical History:  Diagnosis Date  . Abnormal Pap smear   . Bipolar 1 disorder ([place])   . Cancer ([place]) 2005   LEEP-cervical cancer  . Chronic kidney disease    kidney stone at age 25  . DDD (degenerative disc disease), lumbar   . Depression    post-partum depression  . GERD (gastroesophageal reflux disease)    h/o GERD- wt loss alleviated it  . Gestational diabetes mellitus, antepartum    with pregnancy  . Heartburn in pregnancy   . Herpes   . Hypertension   . Ovarian cyst    age 66  . Pregnancy induced hypertension   . Sciatic pain   . Scoliosis   . Tendonitis    both hands  . Vision problems     Past Surgical History:  Procedure Laterality Date  . CESAREAN SECTION     x2--2005 and 2014  . CESAREAN SECTION WITH BILATERAL TUBAL LIGATION Bilateral 04/09/2012   BTL not done  . CESAREAN SECTION  WITH BILATERAL TUBAL LIGATION Bilateral 11/06/2014   Procedure: REPEAT CESAREAN SECTION WITH BILATERAL TUBAL LIGATION REMOVAL OF SKIN TAG;  Surgeon: Crawford Givens, MD;  Location: [facility];  Service: Obstetrics;  Laterality: Bilateral;  . CYSTOSCOPY N/A 04/24/2015   Procedure: CYSTOSCOPY;  Surgeon: Everett Graff, MD;  Location: [facility];  Service: Gynecology;  Laterality: N/A;  . LAPAROSCOPIC HYSTERECTOMY Bilateral 04/24/2015   Procedure: HYSTERECTOMY TOTAL LAPAROSCOPIC bilateral salpingectomy ;  Surgeon: Everett Graff, MD;  Location: [facility];  Service: Gynecology;  Laterality: Bilateral;  . LEEP       Current Outpatient Medications:  .  ACCU-CHEK FASTCLIX LANCETS MISC, 1 each by Does not apply route 4 (four) times daily., Disp: 150 each, Rfl: 5 .  atorvastatin (LIPITOR) 10 MG tablet, Take 10 mg by mouth daily., Disp: , Rfl:  .  famotidine (PEPCID) 20 MG tablet, TAKE 1 TABLET BY MOUTH ONCE DAILY AT BEDTIME AS NEEDED FOR 30 DAYS, Disp: , Rfl:  .  glucose blood (ACCU-CHEK GUIDE) test strip, Use as instructed bid. E11.65, Disp: 150 each, Rfl: 5 .  lisinopril-hydrochlorothiazide (PRINZIDE,ZESTORETIC) 10-12.5 MG tablet, Take 1 tablet by mouth daily., Disp: , Rfl:  .  metFORMIN (GLUCOPHAGE) 500 MG tablet, TAKE 1 TABLET BY MOUTH TWICE DAILY WITH A MEAL FOR 30 DAYS, Disp: , Rfl:   Review of Systems Constitutional: negative Gastrointestinal: negative Genitourinary: normal  Objective:  BP (!) 153/100 (BP Location: Left Arm, Patient Position: Sitting, Cuff Size: Normal)   Pulse 85   Ht 5\' 7"  (1.702 m)   Wt (!) 308 lb 11.2 oz (140 kg)   LMP 02/14/2014   BMI 48.35 kg/m    BMI: Body mass index is 48.35 kg/m.  General Appearance: Alert, appropriate appearance for age. No acute distress HEENT: Grossly normal Neck / Thyroid:  Cardiovascular: RRR; normal S1, S2, no murmur Lungs: CTA bilaterally Back: No CVAT Breast Exam: No masses or nodes.No dimpling, nipple retraction or  discharge. Gastrointestinal: Soft, non-tender, no masses or organomegaly Pelvic Exam: Refuses pelvic exam, denies any pelvic complaints at this time ovaries were preserved VAGINA: Not examined  CERVIX:Sugically removed, orally spoken UTERUS: surgically absent, vaginal cuff well healed. Orally spoken Lymphatic Exam: Non-palpable nodes in neck, clavicular, axillary, or inguinal regions  Skin: no rash or abnormalities Neurologic: Normal gait and speech, no tremor  Psychiatric: Alert and oriented, appropriate affect. Urinalysis:Not done  By signing my name below, I, Samul Dada, attest that this documentation has been prepared under the direction and in the presence of Jonnie Kind, MD. Electronically Signed: [place]. 03/19/18. 8:46 AM.  I personally performed the services described in this documentation, which was SCRIBED in my presence. The recorded information has been reviewed and considered accurate. It has been edited as necessary during review. Jonnie Kind, MD

## 2018-08-23 ENCOUNTER — Other Ambulatory Visit: Payer: Self-pay

## 2018-08-23 ENCOUNTER — Encounter: Payer: Medicaid Other | Attending: Family | Admitting: Nutrition

## 2018-08-23 DIAGNOSIS — E118 Type 2 diabetes mellitus with unspecified complications: Secondary | ICD-10-CM | POA: Insufficient documentation

## 2018-08-23 DIAGNOSIS — E1165 Type 2 diabetes mellitus with hyperglycemia: Secondary | ICD-10-CM

## 2018-08-23 DIAGNOSIS — IMO0002 Reserved for concepts with insufficient information to code with codable children: Secondary | ICD-10-CM

## 2018-08-23 NOTE — Progress Notes (Signed)
  Medical Nutrition Therapy:  Appt start time: 0814 end time:  1630.   Assessment:  Primary concerns today: Diabetes Type 2. Has history of GDM. Lives with her husband and 4 kids at home. Eats 2-3 meals per day. Most foods are air fried, grilled or sauted. Had gallbladder attack. Testing blood sugars.  A1C 7.7%. Metformin 500 mg BID. Has been losing weight. Has changed her diet; Cut out pasta, bread, no sodas, caffeine, sugar drinks. Drink only water. Eating more vegetables and protein with meals. Lab Results  Component Value Date   HGBA1C 7.7 05/11/2017     Preferred Learning Style:    No preference indicated   Learning Readiness:     Ready  Change in progress   MEDICATIONS:   DIETARY INTAKE:     24-hr recall:  B ( AM): coffee Snk ( AM):   L ( PM): Spinach spring mix salad with ham, Olive garden and water Snk ( PM):  D ( PM):  Grilled greek chicken, broccoli and water Snk ( PM):  Beverages: water  Usual physical activity:  Walk daily.  Estimated energy needs: 1200 calories 135 g carbohydrates 90 g protein 33 g fat  Progress Towards Goal(s):  In progress.   Nutritional Diagnosis:  NB-1.1 Food and nutrition-related knowledge deficit As related to Diabetes.  As evidenced by A1C.    Intervention:  Nutrition and Diabetes education provided on My Plate, CHO counting, meal planning, portion sizes, timing of meals, avoiding snacks between meals unless having a low blood sugar, target ranges for A1C and blood sugars, signs/symptoms and treatment of hyper/hypoglycemia, monitoring blood sugars, taking medications as prescribed, benefits of exercising 30 minutes per day and prevention of complications of DM.  Goals Follow The Plate Method Eat meals on time  No snacks.between meals. Eat 30 -45 grams of carbs per meal Increase lower carb vegetables with lunch and dinner Test blood sugar twice a day; in am before breakfast and at bedtime. Get A1C 6.5% or  less. Walk 30-60 minutes a day.  Teaching Method Utilized: Visual Auditory Hands on  Handouts given during visit include:  The Plate Method   Diabetes Instructions.   Barriers to learning/adherence to lifestyle change:  Demonstrated degree of understanding via:  Teach Back   Monitoring/Evaluation:  Dietary intake, exercise, and body weight in 1 month(s).

## 2018-09-06 ENCOUNTER — Encounter: Payer: Self-pay | Admitting: Nutrition

## 2018-09-06 NOTE — Patient Instructions (Signed)
Goals Follow The Plate Method Eat meals on time  No snacks.between meals. Eat 30 -45 grams of carbs per meal Increase lower carb vegetables with lunch and dinner Test blood sugar twice a day; in am before breakfast and at bedtime. Get A1C 6.5% or less. Walk 30-60 minutes a day.

## 2018-09-25 ENCOUNTER — Ambulatory Visit: Payer: Self-pay | Admitting: Cardiovascular Disease

## 2018-10-03 ENCOUNTER — Encounter: Payer: BC Managed Care – PPO | Attending: Family | Admitting: Nutrition

## 2018-10-03 ENCOUNTER — Other Ambulatory Visit: Payer: Self-pay

## 2018-10-03 ENCOUNTER — Encounter: Payer: Self-pay | Admitting: Nutrition

## 2018-10-03 VITALS — Ht 67.5 in | Wt 299.0 lb

## 2018-10-03 DIAGNOSIS — IMO0002 Reserved for concepts with insufficient information to code with codable children: Secondary | ICD-10-CM

## 2018-10-03 DIAGNOSIS — E1165 Type 2 diabetes mellitus with hyperglycemia: Secondary | ICD-10-CM | POA: Insufficient documentation

## 2018-10-03 DIAGNOSIS — E118 Type 2 diabetes mellitus with unspecified complications: Secondary | ICD-10-CM | POA: Insufficient documentation

## 2018-10-03 NOTE — Progress Notes (Signed)
  Medical Nutrition Therapy:  Appt start time: [id number] end time:  1630.   Assessment:  Primary concerns today: Diabetes Type 2. Has history of GDM. Lives with her husband and 4 kids at home. Lost 13 lbs. Changes: no red meat. Eating now eating breakfast, cut out junk food and eating meals on time. Drinks water throughout the day. Not eating bread or whole pastas. Lots of fresh fruits and vegetables. Metformin 500 mg BID.  FBS:200-215  Mg/dl    Night:  210--250's. BS remain elevated inspite of diet changes and 13 lbs weight loss. Diet is much better balanced and she is eating 30-45 grams of carbs per meals Going back to the gym tonight. May benefit form increasing Metformin to 1000 mg BID and or offering Januvia or Ozempic.   Lab Results  Component Value Date   HGBA1C 7.7 05/11/2017     Preferred Learning Style:    No preference indicated   Learning Readiness:     Ready  Change in progress   MEDICATIONS:   DIETARY INTAKE:     24-hr recall:  B ( AM):  Oatmeal , special K or Rasin brain or yugurt and fruit. Snk ( AM):   L ( PM): [place] sandwich, or salads,  Snk ( PM):  D ( PM): meat and vegetables Snk ( PM):  Beverages: water  Usual physical activity:  Walk daily-and going back to gym.  Estimated energy needs: 1200 calories 135 g carbohydrates 90 g protein 33 g fat  Progress Towards Goal(s):  In progress.   Nutritional Diagnosis:  NB-1.1 Food and nutrition-related knowledge deficit As related to Diabetes.  As evidenced by A1C.    Intervention:  Nutrition and Diabetes education provided on My Plate, CHO counting, meal planning, portion sizes, timing of meals, avoiding snacks between meals unless having a low blood sugar, target ranges for A1C and blood sugars, signs/symptoms and treatment of hyper/hypoglycemia, monitoring blood sugars, taking medications as prescribed, benefits of exercising 30 minutes per day and prevention of complications of DM.  Goals Keep  up the great job! Eat 30 -45 grams of carbs per meal Increase lower carb vegetables with lunch and dinner Test blood sugar twice a day; in am before breakfast and at bedtime. Get A1C 6.5% or less. Walk 60 minutes a day. Lose 1-2 lbs per week  Talk to PCP to see if they can increase Metformin and or add some additional medications to improve BS.  Teaching Method Utilized: Visual Auditory Hands on  Handouts given during visit include:  The Plate Method   Diabetes Instructions.   Barriers to learning/adherence to lifestyle change:  Demonstrated degree of understanding via:  Teach Back   Monitoring/Evaluation:  Dietary intake, exercise, and body weight in 1 month(s). Recommend to increase Metfomrin to 1000 mg BID and or add Januvia or Ozempic.

## 2018-10-29 ENCOUNTER — Ambulatory Visit: Payer: Self-pay | Admitting: Cardiovascular Disease

## 2018-11-15 ENCOUNTER — Ambulatory Visit: Payer: BC Managed Care – PPO | Admitting: Nutrition

## 2018-11-20 ENCOUNTER — Other Ambulatory Visit: Payer: Self-pay

## 2018-11-20 ENCOUNTER — Ambulatory Visit (INDEPENDENT_AMBULATORY_CARE_PROVIDER_SITE_OTHER): Payer: Self-pay | Admitting: Cardiology

## 2018-11-20 ENCOUNTER — Encounter: Payer: Self-pay | Admitting: Cardiology

## 2018-11-20 VITALS — BP 112/62 | HR 82 | Ht 67.5 in | Wt 303.0 lb

## 2018-11-20 DIAGNOSIS — M79604 Pain in right leg: Secondary | ICD-10-CM

## 2018-11-20 DIAGNOSIS — E782 Mixed hyperlipidemia: Secondary | ICD-10-CM

## 2018-11-20 DIAGNOSIS — M79605 Pain in left leg: Secondary | ICD-10-CM

## 2018-11-20 DIAGNOSIS — E1165 Type 2 diabetes mellitus with hyperglycemia: Secondary | ICD-10-CM

## 2018-11-20 DIAGNOSIS — I10 Essential (primary) hypertension: Secondary | ICD-10-CM

## 2018-11-20 NOTE — Progress Notes (Signed)
Cardiology Office Note  Date: 11/20/2018   ID: Robin Martin, DOB [birth year], MRN [id number]  PCP:  Wyatt Haste, NP  Consulting Cardiologist:  Rozann Lesches, MD Electrophysiologist:  None   Chief Complaint  Patient presents with  . Leg Pain    History of Present Illness: Robin Martin is a 38 y.o. female referred for cardiology consultation by Mr. Sharol Roussel NP for the evaluation of leg pain.  She states that she has had a tingling sensation in her feet and lower legs, gets "charley horses" at nighttime, but no definite leg cramping or pain with ambulation or prolonged standing.  She has a 3-year history of type 2 diabetes mellitus that began after the birth of her last child.  She has been on medications for glucose control during that time.  Also history of hypertension that has been well controlled on Zestoretic.  I reviewed her medications which are outlined below.  She reports compliance.  She has been working on her diet and trying to lose weight.  She states that she has lost about 15 pounds.  Blood sugars have been better controlled.  She does state that with adjustments in her diabetic regimen, the tingling in her legs has gotten somewhat better.  She was referred with concerns about possible PAD.  I personally reviewed the tracing from 07/27/2018 which shows sinus rhythm with nonspecific T wave changes.  Past Medical History:  Diagnosis Date  . Abnormal Pap smear   . Bipolar 1 disorder ([place])   . Cervical cancer ([place]) 2005   LEEP-cervical cancer  . DDD (degenerative disc disease), lumbar   . Essential hypertension   . GERD (gastroesophageal reflux disease)   . Gestational diabetes mellitus, antepartum   . Heartburn in pregnancy   . Herpes   . Nephrolithiasis   . Ovarian cyst    age 13  . Postpartum depression   . Sciatic pain   . Scoliosis   . Tendonitis   . Vision problems     Past Surgical History:  Procedure Laterality Date  . CESAREAN SECTION     x2--2005 and 2014  . CESAREAN SECTION WITH BILATERAL TUBAL LIGATION Bilateral 04/09/2012   BTL not done  . CESAREAN SECTION WITH BILATERAL TUBAL LIGATION Bilateral 11/06/2014   Procedure: REPEAT CESAREAN SECTION WITH BILATERAL TUBAL LIGATION REMOVAL OF SKIN TAG;  Surgeon: Crawford Givens, MD;  Location: [facility];  Service: Obstetrics;  Laterality: Bilateral;  . CYSTOSCOPY N/A 04/24/2015   Procedure: CYSTOSCOPY;  Surgeon: Everett Graff, MD;  Location: [facility];  Service: Gynecology;  Laterality: N/A;  . LAPAROSCOPIC HYSTERECTOMY Bilateral 04/24/2015   Procedure: HYSTERECTOMY TOTAL LAPAROSCOPIC bilateral salpingectomy ;  Surgeon: Everett Graff, MD;  Location: [facility];  Service: Gynecology;  Laterality: Bilateral;  . LEEP      Current Outpatient Medications  Medication Sig Dispense Refill  . ACCU-CHEK FASTCLIX LANCETS MISC 1 each by Does not apply route 4 (four) times daily. 150 each 5  . atorvastatin (LIPITOR) 10 MG tablet Take 10 mg by mouth daily.    Marland Kitchen glucose blood (ACCU-CHEK GUIDE) test strip Use as instructed bid. E11.65 150 each 5  . lisinopril-hydrochlorothiazide (PRINZIDE,ZESTORETIC) 10-12.5 MG tablet Take 1 tablet by mouth daily.    . metFORMIN (GLUCOPHAGE) 500 MG tablet TAKE 1 TABLET BY MOUTH TWICE DAILY WITH A MEAL FOR 30 DAYS    . omeprazole (PRILOSEC) 20 MG capsule Take 20 mg by mouth daily.     No current facility-administered medications for  this visit.    Allergies:  Chocolate, Latex, and Morphine and related   Social History: The patient  reports that she quit smoking about 6 years ago. Her smoking use included cigarettes. She smoked 0.50 packs per day. She has never used smokeless tobacco. She reports that she does not drink alcohol or use drugs.   Family History: The patient's family history includes Asthma in her sister; Diabetes in her father, maternal grandmother, mother, and sister; Heart attack in her father and maternal grandfather; Heart disease in her father;  Hyperlipidemia in her father; Hypertension in her father, maternal grandfather, maternal grandmother, mother, and paternal grandmother; Mental illness in her mother; Other in her father and maternal grandfather; Stroke in her maternal grandmother.   ROS:  Please see the history of present illness. Otherwise, complete review of systems is positive for none.  All other systems are reviewed and negative.   Physical Exam: VS:  BP 112/62   Pulse 82   Ht 5' 7.5" (1.715 m)   Wt (!) 303 lb (137.4 kg)   LMP 02/14/2014   SpO2 98%   BMI 46.76 kg/m , BMI Body mass index is 46.76 kg/m.  Wt Readings from Last 3 Encounters:  11/20/18 (!) 303 lb (137.4 kg)  10/03/18 299 lb (135.6 kg)  03/19/18 (!) 308 lb 11.2 oz (140 kg)    General: Obese woman, appears comfortable at rest. HEENT: Conjunctiva and lids normal, wearing a mask. Neck: Supple, no elevated JVP or carotid bruits, no thyromegaly. Lungs: Clear to auscultation, nonlabored breathing at rest. Cardiac: Regular rate and rhythm, no S3 or significant systolic murmur, no pericardial rub. Abdomen: Soft, nontender, bowel sounds present. Extremities: No pitting edema, distal pulses 1-2+ in the legs.  No ulcerations. Skin: Warm and dry. Musculoskeletal: No kyphosis. Neuropsychiatric: Alert and oriented x3, affect grossly appropriate.  ECG:  An ECG dated 02/04/2012 was personally reviewed today and demonstrated:  Normal sinus rhythm.  Recent Labwork:    Component Value Date/Time   CHOL 128 05/11/2017   TRIG 155 05/11/2017   HDL 30 (A) 05/11/2017   LDLCALC 67 05/11/2017  April 2019: Hemoglobin A1c 7.7%, BUN 12, creatinine 0.7  Other Studies Reviewed Today:  No prior cardiac testing for review.  Assessment and Plan:  1.  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  She has not been assessed for PAD however and we will arrange lower extremity arterial Dopplers and ABI for further  assessment.  2.  Essential hypertension by history, blood pressure is well controlled today on Zestoretic.  3.  Type 2 diabetes mellitus, on Glucophage and with hemoglobin A1c of 7.7% as of April of last year.  She is following with PCP and trying to make adjustments in her diet with further weight loss.  4.  Mixed hyperlipidemia, on Lipitor.  Medication Adjustments/Labs and Tests Ordered: Current medicines are reviewed at length with the patient today.  Concerns regarding medicines are outlined above.   Tests Ordered: Orders Placed This Encounter  Procedures  . VAS [place] LOWER EXT ART SEG MULTI (SEGMENTALS & LE RAYNAUDS)    Medication Changes: No orders of the defined types were placed in this encounter.   Disposition:  Follow up test results.  Signed, Satira Sark, MD, [facility] 11/20/2018 9:04 AM    [facility], [place], [place] [address] Phone: [phone]; Fax: [phone]

## 2018-11-20 NOTE — Patient Instructions (Addendum)
Medication Instructions:   Your physician recommends that you continue on your current medications as directed. Please refer to the Current Medication list given to you today.  Labwork:  NONE  Testing/Procedures:  Your physician has requested that you have a lower extremity arterial exercise duplex. During this test, exercise and ultrasound are used to evaluate arterial blood flow in the legs. Allow one hour for this exam. There are no restrictions or special instructions. Your physician has requested that you have an ankle brachial index (ABI). During this test an ultrasound and blood pressure cuff are used to evaluate the arteries that supply the arms and legs with blood. Allow thirty minutes for this exam. There are no restrictions or special instructions.  Follow-Up:  Your physician recommends that you schedule a follow-up appointment in: pending.   Any Other Special Instructions Will Be Listed Below (If Applicable).  If you need a refill on your cardiac medications before your next appointment, please call your pharmacy.

## 2018-11-22 NOTE — Addendum Note (Signed)
Addended by: Merlene Laughter on: 11/22/2018 09:03 AM   Modules accepted: Orders

## 2018-11-28 ENCOUNTER — Other Ambulatory Visit: Payer: Self-pay

## 2018-11-28 ENCOUNTER — Ambulatory Visit (HOSPITAL_COMMUNITY)
Admission: RE | Admit: 2018-11-28 | Discharge: 2018-11-28 | Disposition: A | Discharge status: Home / Self Care | Payer: BC Managed Care – PPO | Source: Ambulatory Visit | Attending: Cardiology | Admitting: Cardiology

## 2018-11-28 DIAGNOSIS — M79604 Pain in right leg: Secondary | ICD-10-CM | POA: Diagnosis not present

## 2018-11-28 DIAGNOSIS — M79605 Pain in left leg: Secondary | ICD-10-CM | POA: Diagnosis present

## 2018-11-29 ENCOUNTER — Telehealth: Payer: Self-pay | Admitting: *Deleted

## 2018-11-29 NOTE — Telephone Encounter (Signed)
-----   Message from Satira Sark, MD sent at 11/28/2018  4:04 PM EST ----- Results reviewed.  Please let her know that the lower extremity arterial studies indicate normal blood flow.  I suspect her symptoms are more related to diabetes and neuropathy.  Would keep follow-up with her PCP.

## 2018-11-29 NOTE — Telephone Encounter (Signed)
Patient informed. Copy sent to PCP °

## 2018-12-06 ENCOUNTER — Other Ambulatory Visit: Payer: Self-pay

## 2018-12-06 ENCOUNTER — Encounter: Payer: Self-pay | Admitting: Nutrition

## 2018-12-06 ENCOUNTER — Encounter: Payer: BC Managed Care – PPO | Attending: Family | Admitting: Nutrition

## 2018-12-06 VITALS — Ht 67.5 in | Wt 296.0 lb

## 2018-12-06 DIAGNOSIS — IMO0002 Reserved for concepts with insufficient information to code with codable children: Secondary | ICD-10-CM

## 2018-12-06 DIAGNOSIS — E118 Type 2 diabetes mellitus with unspecified complications: Secondary | ICD-10-CM | POA: Insufficient documentation

## 2018-12-06 DIAGNOSIS — E1165 Type 2 diabetes mellitus with hyperglycemia: Secondary | ICD-10-CM

## 2018-12-06 NOTE — Patient Instructions (Signed)
Goals Keep up the great job! Eat 30 -45 grams of carbs per meal Increase lower carb vegetables with lunch and dinner Test blood sugar twice a day; in am before breakfast and at bedtime. Get A1C 6.5% or less. Walk 60 minutes a day. Lose 1-2 lbs per week  Talk to PCP to see if they can increase Metformin and or add some additional medications to improve BS.

## 2018-12-06 NOTE — Progress Notes (Signed)
  Medical Nutrition Therapy:  Appt start time: 0930 end time:  1000.  Assessment:  Primary concerns today: Diabetes Type 2. Has history of GDM. Lives with her husband and 4 kids at home. Her son is getting married next month. Will get back to exercise after than. Changes made:  Lost 6 lbs. Changes: no red meat. Eating now eating breakfast, cut out junk food and eating meals on time. Drinks water throughout the day. Not eating bread or whole pastas. Lots of fresh fruits and vegetables. Metformin 1000 mg BID.  FBS: 150-160's Mg/dl    Night:  200.   Working out and using execise walking  Vitals with BMI 12/06/2018 11/20/2018  Height 5' 7.5" 5' 7.5"  Weight 296 lbs 303 lbs  BMI [id number]  Systolic  [id number]  Diastolic  62  Pulse  82    Lab Results  Component Value Date   HGBA1C 7.7 05/11/2017     Preferred Learning Style:    No preference indicated   Learning Readiness:     Ready  Change in progress   MEDICATIONS:   DIETARY INTAKE:     24-hr recall:  B ( AM):  Oatmeal , special K or Rasin brain or yugurt and fruit. Snk ( AM):   L ( PM): Taco salad with [place] meat, salsa, water, Snk ( PM):  D ( PM): Cauliflower pizza, pineapple, water  Snk ( PM):  Beverages: water  Usual physical activity:  Walk daily-and going back to gym.  Estimated energy needs: 1200 calories 135 g carbohydrates 90 g protein 33 g fat  Progress Towards Goal(s):  In progress.   Nutritional Diagnosis:  NB-1.1 Food and nutrition-related knowledge deficit As related to Diabetes.  As evidenced by A1C.    Intervention:  Nutrition and Diabetes education provided on My Plate, CHO counting, meal planning, portion sizes, timing of meals, avoiding snacks between meals unless having a low blood sugar, target ranges for A1C and blood sugars, signs/symptoms and treatment of hyper/hypoglycemia, monitoring blood sugars, taking medications as prescribed, benefits of exercising 30 minutes per day and  prevention of complications of DM.  Goals Keep up the great job! Eat 30 -45 grams of carbs per meal Increase lower carb vegetables with lunch and dinner Test blood sugar twice a day; in am before breakfast and at bedtime. Get A1C 6.5% or less. Walk 60 minutes a day. Lose 1-2 lbs per week  Talk to PCP to see if they can increase Metformin and or add some additional medications to improve BS.  Teaching Method Utilized: Visual Auditory Hands on  Handouts given during visit include:  The Plate Method   Diabetes Instructions.   Barriers to learning/adherence to lifestyle change:  Demonstrated degree of understanding via:  Teach Back   Monitoring/Evaluation:  Dietary intake, exercise, and body weight in 3 month(s). Recommend to increase Metfomrin to 1000 mg BID and or add Januvia or Ozempic.

## 2019-01-11 ENCOUNTER — Other Ambulatory Visit: Payer: BC Managed Care – PPO

## 2019-01-14 ENCOUNTER — Ambulatory Visit: Payer: BC Managed Care – PPO | Attending: Internal Medicine

## 2019-01-14 ENCOUNTER — Other Ambulatory Visit: Payer: Self-pay

## 2019-01-14 DIAGNOSIS — Z20822 Contact with and (suspected) exposure to covid-19: Secondary | ICD-10-CM

## 2019-01-15 LAB — NOVEL CORONAVIRUS, NAA: SARS-CoV-2, NAA: NOT DETECTED

## 2019-04-09 ENCOUNTER — Other Ambulatory Visit (HOSPITAL_COMMUNITY): Payer: Self-pay | Admitting: General Practice

## 2019-04-09 ENCOUNTER — Other Ambulatory Visit: Payer: BC Managed Care – PPO

## 2019-04-09 DIAGNOSIS — R112 Nausea with vomiting, unspecified: Secondary | ICD-10-CM

## 2019-04-15 ENCOUNTER — Other Ambulatory Visit: Payer: Self-pay

## 2019-04-15 ENCOUNTER — Encounter (HOSPITAL_COMMUNITY): Payer: Self-pay

## 2019-04-15 ENCOUNTER — Encounter (HOSPITAL_COMMUNITY)
Admission: RE | Admit: 2019-04-15 | Discharge: 2019-04-15 | Disposition: A | Discharge status: Home / Self Care | Payer: BC Managed Care – PPO | Source: Ambulatory Visit | Attending: General Practice | Admitting: General Practice

## 2019-04-15 DIAGNOSIS — R112 Nausea with vomiting, unspecified: Secondary | ICD-10-CM | POA: Insufficient documentation

## 2019-04-15 MED ORDER — TECHNETIUM TC 99M MEBROFENIN IV KIT
5.0000 | PACK | Freq: Once | INTRAVENOUS | Status: AC | PRN
  Administered 2019-04-15: 5.2 via INTRAVENOUS

## 2019-05-06 ENCOUNTER — Ambulatory Visit: Payer: BC Managed Care – PPO | Admitting: Nutrition

## 2019-05-13 ENCOUNTER — Ambulatory Visit: Payer: BC Managed Care – PPO | Admitting: Nutrition

## 2019-05-13 ENCOUNTER — Other Ambulatory Visit: Payer: Self-pay | Admitting: Surgery

## 2019-05-13 DIAGNOSIS — K828 Other specified diseases of gallbladder: Secondary | ICD-10-CM

## 2019-05-20 NOTE — Progress Notes (Signed)
PCP - Leda Gauze, NP  Cardiologist - Rozann Lesches, MD LOV 11-20-18 Chest x-ray -  EKG -  Stress Test -  ECHO -  Cardiac Cath -   Sleep Study -  CPAP -   Fasting Blood Sugar -  Checks Blood Sugar _____ times a day  Blood Thinner Instructions: Aspirin Instructions: Last Dose:  Anesthesia review:   Patient denies shortness of breath, fever, cough and chest pain at PAT appointment   Patient verbalized understanding of instructions that were given to them at the PAT appointment. Patient was also instructed that they will need to review over the PAT instructions again at home before surgery.

## 2019-05-20 NOTE — Patient Instructions (Addendum)
DUE TO COVID-19 ONLY ONE VISITOR IS ALLOWED TO COME WITH YOU AND STAY IN THE WAITING ROOM ONLY DURING PRE OP AND PROCEDURE DAY OF SURGERY. THE 1 VISITOR MAY VISIT WITH YOU AFTER SURGERY IN YOUR PRIVATE ROOM DURING VISITING HOURS ONLY!  YOU NEED TO HAVE A COVID 19 TEST ON 05-24-19 @2 :55 PM, THIS TEST MUST BE DONE BEFORE SURGERY, COME  [address] [place] , [address].  ([place]) ONCE YOUR COVID TEST IS COMPLETED, PLEASE BEGIN THE QUARANTINE INSTRUCTIONS AS OUTLINED IN YOUR HANDOUT.                [place]  05/20/2019   Your procedure is scheduled on: 05-28-19   Report to [facility] Main  Entrance    Report to Admitting at 1:00 PM     Call this number if you have problems the morning of surgery [phone]    Remember: After Midnight, you may have a Clear Liquid Diet until 9:00 AM. After 9:00 AM, nothing until after surgery.      CLEAR LIQUID DIET   Foods Allowed                                                                     Foods Excluded  Coffee and tea, regular and decaf                             liquids that you cannot  Plain Jell-O any favor except red or purple                                           see through such as: Fruit ices (not with fruit pulp)                                     milk, soups, orange juice  Iced Popsicles                                    All solid food Carbonated beverages,  diet                                    Cranberry, grape and apple juices Sports drinks like Gatorade Lightly seasoned clear broth or consume(fat free) Sugar, honey syrup   _____________________________________________________________________    Take these medicines the morning of surgery with A SIP OF WATER: Buspirone (Buspar), Omeprazole (Prilosec), (Efffexor-XR), and Atorvastatin (Lipitor).    BRUSH YOUR TEETH MORNING OF SURGERY AND RINSE YOUR MOUTH OUT, NO CHEWING GUM CANDY OR MINTS.   DO NOT TAKE ANY DIABETIC  MEDICATIONS DAY OF YOUR SURGERY                               You may not have any metal on your body including  hair pins and              piercings    Do not wear jewelry, make-up, lotions, powders or perfumes, deodorant              Do not wear nail polish on your fingernails.  Do not shave  48 hours prior to surgery.                Do not bring valuables to the hospital. [place].  Contacts, dentures or bridgework may not be worn into surgery.       Patients discharged the day of surgery will not be allowed to drive home. IF YOU ARE HAVING SURGERY AND GOING HOME THE SAME DAY, YOU MUST HAVE AN ADULT TO DRIVE YOU HOME AND BE WITH YOU FOR 24 HOURS. YOU MAY GO HOME BY TAXI OR UBER OR ORTHERWISE, BUT AN ADULT MUST ACCOMPANY YOU HOME AND STAY WITH YOU FOR 24 HOURS.  Name and phone number of your driver:Damien Wire [phone]  Special Instructions: N/A              Please read over the following fact sheets you were given: _____________________________________________________________________  How to Manage Your Diabetes Before and After Surgery  Why is it important to control my blood sugar before and after surgery? . Improving blood sugar levels before and after surgery helps healing and can limit problems. . A way of improving blood sugar control is eating a healthy diet by: o  Eating less sugar and carbohydrates o  Increasing activity/exercise o  Talking with your doctor about reaching your blood sugar goals . High blood sugars (greater than 180 mg/dL) can raise your risk of infections and slow your recovery, so you will need to focus on controlling your diabetes during the weeks before surgery. . Make sure that the doctor who takes care of your diabetes knows about your planned surgery including the date and location.  How do I manage my blood sugar before surgery? . Check your blood sugar at least 4 times a day, starting 2 days  before surgery, to make sure that the level is not too high or low. o Check your blood sugar the morning of your surgery when you wake up and every 2 hours until you get to the Short Stay unit. . If your blood sugar is less than 70 mg/dL, you will need to treat for low blood sugar: o Do not take insulin. o Treat a low blood sugar (less than 70 mg/dL) with  cup of clear juice (cranberry or apple), 4 glucose tablets, OR glucose gel. o Recheck blood sugar in 15 minutes after treatment (to make sure it is greater than 70 mg/dL). If your blood sugar is not greater than 70 mg/dL on recheck, call [phone] for further instructions. . Report your blood sugar to the short stay nurse when you get to Short Stay.  . If you are admitted to the hospital after surgery: o Your blood sugar will be checked by the staff and you will probably be given insulin after surgery (instead of oral diabetes medicines) to make sure you have good blood sugar levels. o The goal for blood sugar control after surgery is 80-180 mg/dL.   WHAT DO I DO ABOUT MY DIABETES MEDICATION?  Marland Kitchen Do not take oral diabetes medicines (pills) the morning of surgery.  THE  DAY BEFORE SURGERY, take only your morning or lunch dose of Glimepiride (Amaryl). And you may take your usual dose of Metformin.       Reviewed and Endorsed by [facility] Patient Education Committee, August 2015           [facility] - Preparing for Surgery Before surgery, you can play an important role.  Because skin is not sterile, your skin needs to be as free of germs as possible.  You can reduce the number of germs on your skin by washing with CHG (chlorahexidine gluconate) soap before surgery.  CHG is an antiseptic cleaner which kills germs and bonds with the skin to continue killing germs even after washing. Please DO NOT use if you have an allergy to CHG or antibacterial soaps.  If your skin becomes reddened/irritated stop using the CHG and inform your nurse when  you arrive at Short Stay. Do not shave (including legs and underarms) for at least 48 hours prior to the first CHG shower.  You may shave your face/neck. Please follow these instructions carefully:  1.  Shower with CHG Soap the night before surgery and the  morning of Surgery.  2.  If you choose to wash your hair, wash your hair first as usual with your  normal  shampoo.  3.  After you shampoo, rinse your hair and body thoroughly to remove the  shampoo.                           4.  Use CHG as you would any other liquid soap.  You can apply chg directly  to the skin and wash                       Gently with a scrungie or clean washcloth.  5.  Apply the CHG Soap to your body ONLY FROM THE NECK DOWN.   Do not use on face/ open                           Wound or open sores. Avoid contact with eyes, ears mouth and genitals (private parts).                       Wash face,  Genitals (private parts) with your normal soap.             6.  Wash thoroughly, paying special attention to the area where your surgery  will be performed.  7.  Thoroughly rinse your body with warm water from the neck down.  8.  DO NOT shower/wash with your normal soap after using and rinsing off  the CHG Soap.                9.  Pat yourself dry with a clean towel.            10.  Wear clean pajamas.            11.  Place clean sheets on your bed the night of your first shower and do not  sleep with pets. Day of Surgery : Do not apply any lotions/deodorants the morning of surgery.  Please wear clean clothes to the hospital/surgery center.  FAILURE TO FOLLOW THESE INSTRUCTIONS MAY RESULT IN THE CANCELLATION OF YOUR SURGERY PATIENT SIGNATURE_________________________________  NURSE SIGNATURE__________________________________  ________________________________________________________________________

## 2019-05-20 NOTE — Progress Notes (Signed)
Please place surgery orders. Pt scheduled for her Telephonic PAT visit on 05-21-19.

## 2019-05-21 ENCOUNTER — Encounter (HOSPITAL_COMMUNITY): Payer: Self-pay

## 2019-05-21 ENCOUNTER — Encounter (HOSPITAL_COMMUNITY)
Admission: RE | Admit: 2019-05-21 | Discharge: 2019-05-21 | Disposition: A | Discharge status: Home / Self Care | Payer: BC Managed Care – PPO | Source: Ambulatory Visit | Attending: Surgery | Admitting: Surgery

## 2019-05-21 ENCOUNTER — Ambulatory Visit
Admission: RE | Admit: 2019-05-21 | Discharge: 2019-05-21 | Disposition: A | Discharge status: Home / Self Care | Payer: BC Managed Care – PPO | Source: Ambulatory Visit | Attending: Surgery | Admitting: Surgery

## 2019-05-21 ENCOUNTER — Other Ambulatory Visit: Payer: Self-pay

## 2019-05-21 DIAGNOSIS — Z01818 Encounter for other preprocedural examination: Secondary | ICD-10-CM | POA: Diagnosis not present

## 2019-05-21 DIAGNOSIS — I10 Essential (primary) hypertension: Secondary | ICD-10-CM | POA: Diagnosis not present

## 2019-05-21 DIAGNOSIS — E118 Type 2 diabetes mellitus with unspecified complications: Secondary | ICD-10-CM | POA: Diagnosis not present

## 2019-05-21 DIAGNOSIS — K828 Other specified diseases of gallbladder: Secondary | ICD-10-CM

## 2019-05-21 LAB — CBC
HCT: 41.6 % (ref 36.0–46.0)
Hemoglobin: 14.3 g/dL (ref 12.0–15.0)
MCH: 30.2 pg (ref 26.0–34.0)
MCHC: 34.4 g/dL (ref 30.0–36.0)
MCV: 87.9 fL (ref 80.0–100.0)
Platelets: 240 10*3/uL (ref 150–400)
RBC: 4.73 MIL/uL (ref 3.87–5.11)
RDW: 11.9 % (ref 11.5–15.5)
WBC: 11 10*3/uL — ABNORMAL HIGH (ref 4.0–10.5)
nRBC: 0 % (ref 0.0–0.2)

## 2019-05-21 LAB — BASIC METABOLIC PANEL
Anion gap: 12 (ref 5–15)
BUN: 16 mg/dL (ref 6–20)
CO2: 27 mmol/L (ref 22–32)
Calcium: 9 mg/dL (ref 8.9–10.3)
Chloride: 99 mmol/L (ref 98–111)
Creatinine, Ser: 0.62 mg/dL (ref 0.44–1.00)
GFR calc Af Amer: >60 mL/min (ref >60)
GFR calc non Af Amer: >60 mL/min (ref >60)
Glucose, Bld: 234 mg/dL — ABNORMAL HIGH (ref 70–99)
Potassium: 3.3 mmol/L — ABNORMAL LOW (ref 3.5–5.1)
Sodium: 138 mmol/L (ref 135–145)

## 2019-05-21 LAB — HEMOGLOBIN A1C
Hgb A1c MFr Bld: 7.6 % — ABNORMAL HIGH (ref 4.8–5.6)
Mean Plasma Glucose: 171.42 mg/dL

## 2019-05-21 LAB — GLUCOSE, CAPILLARY: Glucose-Capillary: 224 mg/dL — ABNORMAL HIGH (ref 70–99)

## 2019-05-24 ENCOUNTER — Other Ambulatory Visit (HOSPITAL_COMMUNITY)
Admission: RE | Admit: 2019-05-24 | Discharge: 2019-05-24 | Disposition: A | Discharge status: Home / Self Care | Payer: BC Managed Care – PPO | Source: Ambulatory Visit | Attending: Surgery | Admitting: Surgery

## 2019-05-24 DIAGNOSIS — Z20822 Contact with and (suspected) exposure to covid-19: Secondary | ICD-10-CM | POA: Diagnosis not present

## 2019-05-24 DIAGNOSIS — Z01812 Encounter for preprocedural laboratory examination: Secondary | ICD-10-CM | POA: Diagnosis present

## 2019-05-24 LAB — SARS CORONAVIRUS 2 (TAT 6-24 HRS): SARS Coronavirus 2: NEGATIVE

## 2019-05-27 NOTE — H&P (Signed)
Chief Complaint:  Right upper quadrant pain  History of Present Illness:  Robin Martin is an 39 y.o. female who was seen in the [facility] with a hx of midepigastric and right upper quadrant pain that began in June 2020.  She had a borderline HIDA.  Her UGI was normal except for some mild GER.  Informed consent obtained and discussed with her and her husband (by iphone FT)  Past Medical History:  Diagnosis Date  . Abnormal Pap smear   . Bipolar 1 disorder ([place])   . Cervical cancer ([place]) 2005   LEEP-cervical cancer  . DDD (degenerative disc disease), lumbar   . Essential hypertension   . GERD (gastroesophageal reflux disease)   . Gestational diabetes mellitus, antepartum   . Heartburn in pregnancy   . Herpes   . Nephrolithiasis   . Ovarian cyst    age 6  . Postpartum depression   . Sciatic pain   . Scoliosis   . Tendonitis   . Vision problems     Past Surgical History:  Procedure Laterality Date  . CESAREAN SECTION     x2--2005 and 2014  . CESAREAN SECTION WITH BILATERAL TUBAL LIGATION Bilateral 04/09/2012   BTL not done  . CESAREAN SECTION WITH BILATERAL TUBAL LIGATION Bilateral 11/06/2014   Procedure: REPEAT CESAREAN SECTION WITH BILATERAL TUBAL LIGATION REMOVAL OF SKIN TAG;  Surgeon: Crawford Givens, MD;  Location: [facility];  Service: Obstetrics;  Laterality: Bilateral;  . CYSTOSCOPY N/A 04/24/2015   Procedure: CYSTOSCOPY;  Surgeon: Everett Graff, MD;  Location: [facility];  Service: Gynecology;  Laterality: N/A;  . LAPAROSCOPIC HYSTERECTOMY Bilateral 04/24/2015   Procedure: HYSTERECTOMY TOTAL LAPAROSCOPIC bilateral salpingectomy ;  Surgeon: Everett Graff, MD;  Location: [facility];  Service: Gynecology;  Laterality: Bilateral;  . LEEP    . TYMPANOSTOMY TUBE PLACEMENT      No current facility-administered medications for this encounter.   Current Outpatient Medications  Medication Sig Dispense Refill  . atorvastatin (LIPITOR) 10 MG tablet Take 10 mg by mouth daily.     . busPIRone (BUSPAR) 5 MG tablet Take 5 mg by mouth 2 (two) times daily.    Marland Kitchen CRANBERRY PO Take 1 tablet by mouth daily.    Marland Kitchen glimepiride (AMARYL) 4 MG tablet Take 2-4 mg by mouth See admin instructions. Take 1 tablet (4 mg) by mouth in the morning & take 0.5 tablet (2 mg) by mouth in the evening.    . hydrOXYzine (VISTARIL) 50 MG capsule Take 100 mg by mouth at bedtime.    Marland Kitchen lisinopril-hydrochlorothiazide (ZESTORETIC) 20-25 MG tablet Take 1 tablet by mouth daily.    . metFORMIN (GLUCOPHAGE) 1000 MG tablet Take 1,000 mg by mouth 2 (two) times daily.    . Multiple Vitamin (MULTIVITAMIN WITH MINERALS) TABS tablet Take 1 tablet by mouth daily.    Marland Kitchen omeprazole (PRILOSEC) 20 MG capsule Take 20 mg by mouth daily.    . ondansetron (ZOFRAN) 4 MG tablet Take 4 mg by mouth every 4 (four) hours as needed for nausea/vomiting.    . promethazine (PHENERGAN) 25 MG tablet Take 25 mg by mouth every 8 (eight) hours as needed for nausea/vomiting.    . venlafaxine XR (EFFEXOR-XR) 37.5 MG 24 hr capsule Take 37.5 mg by mouth daily.    Marland Kitchen ACCU-CHEK FASTCLIX LANCETS MISC 1 each by Does not apply route 4 (four) times daily. 150 each 5  . glucose blood (ACCU-CHEK GUIDE) test strip Use as instructed bid. E11.65 150 each 5  Chocolate, Latex, and Morphine and related Family History  Problem Relation Age of Onset  . Diabetes Mother   . Hypertension Mother   . Mental illness Mother   . Diabetes Father   . Hypertension Father   . Heart disease Father   . Heart attack Father   . Hyperlipidemia Father   . Other Father        heart surgery  . Asthma Sister   . Diabetes Sister   . Diabetes Maternal Grandmother   . Hypertension Maternal Grandmother   . Stroke Maternal Grandmother   . Hypertension Paternal Grandmother   . Hypertension Maternal Grandfather   . Heart attack Maternal Grandfather   . Other Maternal Grandfather        heart surgery   Social History:   reports that she quit smoking about 7 years ago.  Her smoking use included cigarettes. She smoked 0.50 packs per day. She has never used smokeless tobacco. She reports that she does not drink alcohol or use drugs.   REVIEW OF SYSTEMS : Negative except for see problem list  Physical Exam:   Last menstrual period 02/14/2014, unknown if currently breastfeeding. There is no height or weight on file to calculate BMI.  Gen:  WDWN F NAD  Neurological: Alert and oriented to person, place, and time. Motor and sensory function is grossly intact  Head: Normocephalic and atraumatic.  Eyes: Conjunctivae are normal. Pupils are equal, round, and reactive to light. No scleral icterus.  Neck: Normal range of motion. Neck supple. No tracheal deviation or thyromegaly present.  Cardiovascular:  SR without murmurs or gallops.  No carotid bruits Breast:  Not examined Respiratory: Effort normal.  No respiratory distress. No chest wall tenderness. Breath sounds normal.  No wheezes, rales or rhonchi.  Abdomen:  No acute pain GU:  Not examined Musculoskeletal: Normal range of motion. Extremities are nontender. No cyanosis, edema or clubbing noted Lymphadenopathy: No cervical, preauricular, postauricular or axillary adenopathy is present Skin: Skin is warm and dry. No rash noted. No diaphoresis. No erythema. No pallor. Pscyh: Normal mood and affect. Behavior is normal. Judgment and thought content normal.   LABORATORY RESULTS: No results found for this or any previous visit (from the past 48 hour(s)).   RADIOLOGY RESULTS: No results found.  Problem List: Patient Active Problem List   Diagnosis Date Noted  . 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 Dysfunctional uterine bleeding 04/24/2015  . Polyhydramnios in third trimester--90%ile, 22.78 11/06/14 11/06/2014  . S/P cesarean section 11/06/2014  . BMI 50.0-59.9, adult ([place]) 11/04/2014  . Positive GBS test 11/04/2014  . H/O postpartum depression, currently pregnant 11/04/2014  . Request for  sterilization--consent signed 08/26/14 11/04/2014  . Hypertension--sporadic, related to weight 11/04/2014  . Previous cesarean section x 2 09/13/2014  . Bipolar 1 disorder ([place]) 09/13/2014  . Latex allergy 09/13/2014  .  Hx LGA (large for gestational age) fetus x 2 (9+) 09/13/2014  . Allergy to morphine 09/13/2014  . Gestational diabetes--on insulin 09/13/2014  . 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 Hx of postpartum hemorrhage, currently pregnant--with 1st pregnancy, patient reports cardiac arrest 09/13/2014  . Chronic back pain 09/13/2014  . H/O LEEP--2006 09/13/2014  . Rh negative, maternal 04/06/2012    Assessment & Plan: Biliary dyskinesia for lap cholecystectomy    Matt B. Hassell Done, MD, [facility] [phone] beeper [phone]  05/27/2019 1:45 PM

## 2019-05-27 NOTE — Progress Notes (Signed)
Called patient with time change for surgery. She is to arrive 0900 for 1100 surgery on 05/28/19. She verbalizes understanding. Clarified that patient has had hysterectomy. Discontinued order for urine pregnancy.

## 2019-05-28 ENCOUNTER — Encounter (HOSPITAL_COMMUNITY)
Admission: RE | Disposition: A | Discharge status: Home / Self Care | Payer: Self-pay | Source: Home / Self Care | Attending: Surgery

## 2019-05-28 ENCOUNTER — Ambulatory Visit (HOSPITAL_COMMUNITY)
Admission: RE | Admit: 2019-05-28 | Discharge: 2019-05-28 | Disposition: A | Discharge status: Home / Self Care | Payer: BC Managed Care – PPO | Attending: Surgery | Admitting: Surgery

## 2019-05-28 ENCOUNTER — Ambulatory Visit (HOSPITAL_COMMUNITY): Payer: BC Managed Care – PPO

## 2019-05-28 ENCOUNTER — Ambulatory Visit (HOSPITAL_COMMUNITY): Payer: BC Managed Care – PPO | Admitting: Anesthesiology

## 2019-05-28 ENCOUNTER — Encounter (HOSPITAL_COMMUNITY): Payer: Self-pay | Admitting: Surgery

## 2019-05-28 ENCOUNTER — Other Ambulatory Visit: Payer: Self-pay

## 2019-05-28 DIAGNOSIS — I10 Essential (primary) hypertension: Secondary | ICD-10-CM | POA: Diagnosis not present

## 2019-05-28 DIAGNOSIS — K219 Gastro-esophageal reflux disease without esophagitis: Secondary | ICD-10-CM | POA: Insufficient documentation

## 2019-05-28 DIAGNOSIS — F319 Bipolar disorder, unspecified: Secondary | ICD-10-CM | POA: Insufficient documentation

## 2019-05-28 DIAGNOSIS — Z7984 Long term (current) use of oral hypoglycemic drugs: Secondary | ICD-10-CM | POA: Diagnosis not present

## 2019-05-28 DIAGNOSIS — Z87891 Personal history of nicotine dependence: Secondary | ICD-10-CM | POA: Insufficient documentation

## 2019-05-28 DIAGNOSIS — Z8541 Personal history of malignant neoplasm of cervix uteri: Secondary | ICD-10-CM | POA: Diagnosis not present

## 2019-05-28 DIAGNOSIS — E119 Type 2 diabetes mellitus without complications: Secondary | ICD-10-CM | POA: Diagnosis not present

## 2019-05-28 DIAGNOSIS — Z6841 Body Mass Index (BMI) 40.0 and over, adult: Secondary | ICD-10-CM | POA: Diagnosis not present

## 2019-05-28 DIAGNOSIS — Z79899 Other long term (current) drug therapy: Secondary | ICD-10-CM | POA: Diagnosis not present

## 2019-05-28 DIAGNOSIS — K828 Other specified diseases of gallbladder: Secondary | ICD-10-CM

## 2019-05-28 DIAGNOSIS — K811 Chronic cholecystitis: Secondary | ICD-10-CM | POA: Diagnosis not present

## 2019-05-28 HISTORY — PX: CHOLECYSTECTOMY: SHX55

## 2019-05-28 LAB — GLUCOSE, CAPILLARY: Glucose-Capillary: 209 mg/dL — ABNORMAL HIGH (ref 70–99)

## 2019-05-28 SURGERY — LAPAROSCOPIC CHOLECYSTECTOMY WITH INTRAOPERATIVE CHOLANGIOGRAM
Anesthesia: General | Site: Abdomen

## 2019-05-28 MED ORDER — LIDOCAINE 2% (20 MG/ML) 5 ML SYRINGE
INTRAMUSCULAR | Status: AC
  Filled 2019-05-28: qty 5

## 2019-05-28 MED ORDER — ONDANSETRON HCL 4 MG/2ML IJ SOLN
INTRAMUSCULAR | Status: DC | PRN
  Administered 2019-05-28: 4 mg via INTRAVENOUS

## 2019-05-28 MED ORDER — CHLORHEXIDINE GLUCONATE CLOTH 2 % EX PADS: 6.0000 | MEDICATED_PAD | Freq: Once | CUTANEOUS | Status: DC

## 2019-05-28 MED ORDER — SUGAMMADEX SODIUM 500 MG/5ML IV SOLN
INTRAVENOUS | Status: DC | PRN
  Administered 2019-05-28: 271.6 mg via INTRAVENOUS

## 2019-05-28 MED ORDER — LACTATED RINGERS IV SOLN: INTRAVENOUS | Status: DC

## 2019-05-28 MED ORDER — PHENYLEPHRINE 40 MCG/ML (10ML) SYRINGE FOR IV PUSH (FOR BLOOD PRESSURE SUPPORT)
PREFILLED_SYRINGE | INTRAVENOUS | Status: DC | PRN
  Administered 2019-05-28: 80 ug via INTRAVENOUS

## 2019-05-28 MED ORDER — ROCURONIUM BROMIDE 10 MG/ML (PF) SYRINGE
PREFILLED_SYRINGE | INTRAVENOUS | Status: AC
  Filled 2019-05-28: qty 10

## 2019-05-28 MED ORDER — SCOPOLAMINE 1 MG/3DAYS TD PT72
1.0000 | MEDICATED_PATCH | TRANSDERMAL | Status: DC
  Administered 2019-05-28: 1.5 mg via TRANSDERMAL
  Filled 2019-05-28: qty 1

## 2019-05-28 MED ORDER — ACETAMINOPHEN 500 MG PO TABS
1000.0000 mg | ORAL_TABLET | Freq: Once | ORAL | Status: AC
  Administered 2019-05-28: 1000 mg via ORAL
  Filled 2019-05-28: qty 2

## 2019-05-28 MED ORDER — OXYCODONE HCL 5 MG/5ML PO SOLN: 5.0000 mg | Freq: Once | ORAL | Status: DC | PRN

## 2019-05-28 MED ORDER — DEXAMETHASONE SODIUM PHOSPHATE 10 MG/ML IJ SOLN
INTRAMUSCULAR | Status: AC
  Filled 2019-05-28: qty 1

## 2019-05-28 MED ORDER — BUPIVACAINE LIPOSOME 1.3 % IJ SUSP
20.0000 mL | Freq: Once | INTRAMUSCULAR | Status: AC
  Administered 2019-05-28: 20 mL
  Filled 2019-05-28: qty 20

## 2019-05-28 MED ORDER — MEPERIDINE HCL 50 MG/ML IJ SOLN: 6.2500 mg | INTRAMUSCULAR | Status: DC | PRN

## 2019-05-28 MED ORDER — FENTANYL CITRATE (PF) 250 MCG/5ML IJ SOLN
INTRAMUSCULAR | Status: DC | PRN
  Administered 2019-05-28: 100 ug via INTRAVENOUS
  Administered 2019-05-28 (×3): 50 ug via INTRAVENOUS

## 2019-05-28 MED ORDER — PROPOFOL 10 MG/ML IV BOLUS
INTRAVENOUS | Status: AC
  Filled 2019-05-28: qty 20

## 2019-05-28 MED ORDER — KETAMINE HCL 10 MG/ML IJ SOLN
INTRAMUSCULAR | Status: DC | PRN
  Administered 2019-05-28: 50 mg via INTRAVENOUS

## 2019-05-28 MED ORDER — LACTATED RINGERS IR SOLN
Status: DC | PRN
  Administered 2019-05-28: 1000 mL

## 2019-05-28 MED ORDER — ROCURONIUM BROMIDE 10 MG/ML (PF) SYRINGE
PREFILLED_SYRINGE | INTRAVENOUS | Status: DC | PRN
  Administered 2019-05-28: 20 mg via INTRAVENOUS
  Administered 2019-05-28: 60 mg via INTRAVENOUS

## 2019-05-28 MED ORDER — KETOROLAC TROMETHAMINE 30 MG/ML IJ SOLN
30.0000 mg | Freq: Once | INTRAMUSCULAR | Status: AC | PRN
  Administered 2019-05-28: 30 mg via INTRAVENOUS

## 2019-05-28 MED ORDER — FENTANYL CITRATE (PF) 250 MCG/5ML IJ SOLN
INTRAMUSCULAR | Status: AC
  Filled 2019-05-28: qty 5

## 2019-05-28 MED ORDER — IOHEXOL 300 MG/ML  SOLN
INTRAMUSCULAR | Status: DC | PRN
  Administered 2019-05-28: 2.5 mL

## 2019-05-28 MED ORDER — MIDAZOLAM HCL 2 MG/2ML IJ SOLN
INTRAMUSCULAR | Status: AC
  Filled 2019-05-28: qty 2

## 2019-05-28 MED ORDER — DEXTROSE 5 % IV SOLN
3.0000 g | INTRAVENOUS | Status: AC
  Administered 2019-05-28: 3 g via INTRAVENOUS
  Filled 2019-05-28: qty 3

## 2019-05-28 MED ORDER — PROMETHAZINE HCL 25 MG/ML IJ SOLN
6.2500 mg | INTRAMUSCULAR | Status: DC | PRN
  Administered 2019-05-28: 6.25 mg via INTRAVENOUS

## 2019-05-28 MED ORDER — SUGAMMADEX SODIUM 500 MG/5ML IV SOLN
INTRAVENOUS | Status: AC
  Filled 2019-05-28: qty 5

## 2019-05-28 MED ORDER — ONDANSETRON HCL 4 MG/2ML IJ SOLN
INTRAMUSCULAR | Status: AC
  Filled 2019-05-28: qty 2

## 2019-05-28 MED ORDER — HYDROMORPHONE HCL 1 MG/ML IJ SOLN
0.2500 mg | INTRAMUSCULAR | Status: DC | PRN
  Administered 2019-05-28: 0.5 mg via INTRAVENOUS

## 2019-05-28 MED ORDER — MIDAZOLAM HCL 5 MG/5ML IJ SOLN
INTRAMUSCULAR | Status: DC | PRN
  Administered 2019-05-28: 2 mg via INTRAVENOUS

## 2019-05-28 MED ORDER — PHENYLEPHRINE 40 MCG/ML (10ML) SYRINGE FOR IV PUSH (FOR BLOOD PRESSURE SUPPORT)
PREFILLED_SYRINGE | INTRAVENOUS | Status: AC
  Filled 2019-05-28: qty 10

## 2019-05-28 MED ORDER — OXYCODONE HCL 5 MG PO TABS: 5.0000 mg | ORAL_TABLET | Freq: Once | ORAL | Status: DC | PRN

## 2019-05-28 MED ORDER — DEXAMETHASONE SODIUM PHOSPHATE 10 MG/ML IJ SOLN
INTRAMUSCULAR | Status: DC | PRN
  Administered 2019-05-28: 4 mg via INTRAVENOUS

## 2019-05-28 MED ORDER — LIDOCAINE 2% (20 MG/ML) 5 ML SYRINGE
INTRAMUSCULAR | Status: DC | PRN
  Administered 2019-05-28: 60 mg via INTRAVENOUS

## 2019-05-28 MED ORDER — KETOROLAC TROMETHAMINE 30 MG/ML IJ SOLN
INTRAMUSCULAR | Status: AC
  Filled 2019-05-28: qty 1

## 2019-05-28 MED ORDER — 0.9 % SODIUM CHLORIDE (POUR BTL) OPTIME
TOPICAL | Status: DC | PRN
  Administered 2019-05-28: 1000 mL

## 2019-05-28 MED ORDER — PROMETHAZINE HCL 25 MG/ML IJ SOLN
INTRAMUSCULAR | Status: AC
  Filled 2019-05-28: qty 1

## 2019-05-28 MED ORDER — HYDROMORPHONE HCL 1 MG/ML IJ SOLN
INTRAMUSCULAR | Status: AC
  Filled 2019-05-28: qty 1

## 2019-05-28 MED ORDER — KETAMINE HCL 10 MG/ML IJ SOLN
INTRAMUSCULAR | Status: AC
  Filled 2019-05-28: qty 1

## 2019-05-28 MED ORDER — HYDROCODONE-ACETAMINOPHEN 5-325 MG PO TABS
1.0000 | ORAL_TABLET | Freq: Four times a day (QID) | ORAL | 0 refills | Status: DC | PRN
Start: 2019-05-28 — End: 2023-01-27

## 2019-05-28 MED ORDER — PROPOFOL 10 MG/ML IV BOLUS
INTRAVENOUS | Status: DC | PRN
  Administered 2019-05-28: 200 mg via INTRAVENOUS

## 2019-05-28 SURGICAL SUPPLY — 40 items
APPLICATOR COTTON TIP 6 STRL (MISCELLANEOUS) ×2 IMPLANT
APPLICATOR COTTON TIP 6IN STRL (MISCELLANEOUS) ×4
APPLIER CLIP 5 13 M/L LIGAMAX5 (MISCELLANEOUS)
APPLIER CLIP ROT 10 11.4 M/L (STAPLE) ×2
BENZOIN TINCTURE PRP APPL 2/3 (GAUZE/BANDAGES/DRESSINGS) IMPLANT
CABLE HIGH FREQUENCY MONO STRZ (ELECTRODE) IMPLANT
CATH REDDICK CHOLANGI 4FR 50CM (CATHETERS) ×2 IMPLANT
CLIP APPLIE 5 13 M/L LIGAMAX5 (MISCELLANEOUS) IMPLANT
CLIP APPLIE ROT 10 11.4 M/L (STAPLE) ×1 IMPLANT
COVER MAYO STAND STRL (DRAPES) ×2 IMPLANT
COVER SURGICAL LIGHT HANDLE (MISCELLANEOUS) ×2 IMPLANT
COVER WAND RF STERILE (DRAPES) IMPLANT
DECANTER SPIKE VIAL GLASS SM (MISCELLANEOUS) IMPLANT
DERMABOND ADVANCED (GAUZE/BANDAGES/DRESSINGS) ×1
DERMABOND ADVANCED .7 DNX12 (GAUZE/BANDAGES/DRESSINGS) ×1 IMPLANT
DRAPE C-ARM 42X120 X-RAY (DRAPES) ×2 IMPLANT
ELECT L-HOOK LAP 45CM DISP (ELECTROSURGICAL) ×2
ELECT PENCIL ROCKER SW 15FT (MISCELLANEOUS) ×2 IMPLANT
ELECT REM PT RETURN 15FT ADLT (MISCELLANEOUS) ×2 IMPLANT
ELECTRODE L-HOOK LAP 45CM DISP (ELECTROSURGICAL) ×1 IMPLANT
GLOVE BIOGEL M 8.0 STRL (GLOVE) ×2 IMPLANT
GOWN STRL REUS W/TWL XL LVL3 (GOWN DISPOSABLE) ×2 IMPLANT
HEMOSTAT SURGICEL 4X8 (HEMOSTASIS) IMPLANT
IV CATH 14GX2 1/4 (CATHETERS) ×2 IMPLANT
KIT BASIN (CUSTOM PROCEDURE TRAY) ×2 IMPLANT
KIT TURNOVER KIT A (KITS) ×2 IMPLANT
POUCH RETRIEVAL ECOSAC 10 (ENDOMECHANICALS) ×1 IMPLANT
POUCH RETRIEVAL ECOSAC 10MM (ENDOMECHANICALS) ×1
SCISSORS LAP 5X45 EPIX DISP (ENDOMECHANICALS) ×2 IMPLANT
SET IRRIG TUBING LAPAROSCOPIC (IRRIGATION / IRRIGATOR) ×2 IMPLANT
SET TUBE SMOKE EVAC HIGH FLOW (TUBING) ×2 IMPLANT
SLEEVE XCEL OPT CAN 5 100 (ENDOMECHANICALS) ×2 IMPLANT
STRIP CLOSURE SKIN 1/2X4 (GAUZE/BANDAGES/DRESSINGS) IMPLANT
SUT MNCRL AB 4-0 PS2 18 (SUTURE) ×4 IMPLANT
SYR 20ML LL LF (SYRINGE) ×2 IMPLANT
TOWEL OR 17X26 10 PK STRL BLUE (TOWEL DISPOSABLE) ×2 IMPLANT
TRAY LAPAROSCOPIC (CUSTOM PROCEDURE TRAY) ×2 IMPLANT
TROCAR BLADELESS OPT 5 100 (ENDOMECHANICALS) ×2 IMPLANT
TROCAR XCEL BLUNT TIP 100MML (ENDOMECHANICALS) IMPLANT
TROCAR XCEL NON-BLD 11X100MML (ENDOMECHANICALS) IMPLANT

## 2019-05-28 NOTE — Interval H&P Note (Signed)
History and Physical Interval Note:  05/28/2019 10:38 AM  Robin Martin  has presented today for surgery, with the diagnosis of BILIARY DYSKINESIA.  The various methods of treatment have been discussed with the patient and family. After consideration of risks, benefits and other options for treatment, the patient has consented to  Procedure(s): LAPAROSCOPIC CHOLECYSTECTOMY WITH INTRAOPERATIVE CHOLANGIOGRAM (N/A) as a surgical intervention.  The patient's history has been reviewed, patient examined, no change in status, stable for surgery.  I have reviewed the patient's chart and labs.  Questions were answered to the patient's satisfaction.     Pedro Earls

## 2019-05-28 NOTE — Anesthesia Postprocedure Evaluation (Signed)
Anesthesia Post Note  Patient: Robin Martin  Procedure(s) Performed: LAPAROSCOPIC CHOLECYSTECTOMY WITH INTRAOPERATIVE CHOLANGIOGRAM (N/A Abdomen)     Patient location during evaluation: PACU Anesthesia Type: General Level of consciousness: awake and alert, oriented and patient cooperative Pain management: pain level controlled Vital Signs Assessment: post-procedure vital signs reviewed and stable Respiratory status: spontaneous breathing, nonlabored ventilation and respiratory function stable Cardiovascular status: blood pressure returned to baseline and stable Postop Assessment: no apparent nausea or vomiting Anesthetic complications: no    Last Vitals:  Vitals:   05/28/19 1300 05/28/19 1315  BP: 110/65 (!) 103/52  Pulse: 84 87  Resp: (!) 21 18  Temp:    SpO2: 90% 92%    Last Pain:  Vitals:   05/28/19 1315  TempSrc:   PainSc: [place]

## 2019-05-28 NOTE — Op Note (Signed)
[place]  Primary Care Physician:  Dara Lords, NP    05/28/2019  12:12 PM  Procedure: Laparoscopic Cholecystectomy with intraoperative cholangiogram  Surgeon: Catalina Antigua B. Hassell Done, MD, FACS Asst:  Greer Pickerel, MD, FACS  Anes:  General  Drains:  None  Findings: Normal IOC and biliary dyskinesia   Description of Procedure: The patient was taken to OR 1 and given general anesthesia.  The patient was prepped with chloroprep and draped sterilely. A time out was performed.  Access to the abdomen was achieved with a 5 mm Optiview through the right upper quadrant.  Port placement included three 5 mm trocars and one 11 mm in the upper abdomen.    The gallbladder was visualized and the fundus was grasped and the gallbladder was elevated. Traction on the infundibulum allowed for successful demonstration of the critical view. Inflammatory changes were minimal and chronic.  The cystic duct was identified and clipped up on the gallbladder and an incision was made in the cystic duct and the Reddick catheter was inserted after milking the cystic duct of any debris. A dynamic cholangiogram was performed which demonstrated intrahepatic filling and flow into the duodenum and some retrograde flow into the pancreatic duct.    The cystic duct was then triple clipped and divided, the cystic artery was double clipped and divided and then the gallbladder was removed from the gallbladder bed. Removal of the gallbladder from the gallbladder bed was performed without entering it.  The gallbladder was then placed in a bag and brought out through one of the trocar sites. The gallbladder bed was inspected and no bleeding or bile leaks were seen.   Laparoscopic visualization was used when closing fascial defects for trocar sites.   Incisions were injected with Exparel and closed with 4-0 Monocryl and Dermabond on the skin.  Sponge and needle count were correct.  .    The patient was taken to the recovery room in  satisfactory condition.

## 2019-05-28 NOTE — Anesthesia Procedure Notes (Signed)
Procedure Name: Intubation Date/Time: 05/28/2019 10:52 AM Performed by: Anne Fu, CRNA Pre-anesthesia Checklist: Patient identified, Emergency Drugs available, Suction available, Patient being monitored and Timeout performed Patient Re-evaluated:Patient Re-evaluated prior to induction Oxygen Delivery Method: Circle system utilized Preoxygenation: Pre-oxygenation with 100% oxygen Induction Type: IV induction Ventilation: Mask ventilation without difficulty Laryngoscope Size: Mac and 4 Grade View: Grade I Tube type: Oral Tube size: 7.5 mm Number of attempts: 1 Airway Equipment and Method: Stylet Placement Confirmation: ETT inserted through vocal cords under direct vision,  positive ETCO2 and breath sounds checked- equal and bilateral Secured at: 22 cm Tube secured with: Tape Dental Injury: Teeth and Oropharynx as per pre-operative assessment

## 2019-05-28 NOTE — Transfer of Care (Signed)
Immediate Anesthesia Transfer of Care Note  Patient: Robin Martin  Procedure(s) Performed: Procedure(s): LAPAROSCOPIC CHOLECYSTECTOMY WITH INTRAOPERATIVE CHOLANGIOGRAM (N/A)  Patient Location: PACU  Anesthesia Type:General  Level of Consciousness:  sedated, patient cooperative and responds to stimulation  Airway & Oxygen Therapy:Patient Spontanous Breathing and Patient connected to face mask oxgen  Post-op Assessment:  Report given to PACU RN and Post -op Vital signs reviewed and stable  Post vital signs:  Reviewed and stable  Last Vitals:  Vitals:   05/28/19 0910 05/28/19 1222  BP: (!) 127/91   Pulse: 81   Resp: 18   Temp: 37.1 C (P) 37.1 C  SpO2: 123456     Complications: No apparent anesthesia complications

## 2019-05-28 NOTE — Discharge Instructions (Signed)

## 2019-05-28 NOTE — Anesthesia Preprocedure Evaluation (Signed)
Anesthesia Evaluation  Patient identified by MRN, date of birth, ID band Patient awake    Reviewed: Allergy & Precautions, NPO status , Patient's Chart, lab work & pertinent test results  Airway Mallampati: II  TM Distance: >3 FB Neck ROM: Full    Dental  (+) Teeth Intact, Dental Advisory Given   Pulmonary former smoker,  Quit smoking 2014   Pulmonary exam normal breath sounds clear to auscultation       Cardiovascular hypertension, Pt. on medications Normal cardiovascular exam Rhythm:Regular Rate:Normal     Neuro/Psych PSYCHIATRIC DISORDERS Depression Bipolar Disorder negative neurological ROS     GI/Hepatic Neg liver ROS, GERD  Controlled and Medicated,Biliary dyskinesia    Endo/Other  diabetes, Well Controlled, Type 2, Oral Hypoglycemic AgentsMorbid obesityBMI 47 Last A1c 7.6  Renal/GU negative Renal ROS  negative genitourinary   Musculoskeletal  (+) Arthritis , Osteoarthritis,    Abdominal (+) + obese,   Peds  Hematology hct 41.6, plt 240   Anesthesia Other Findings   Reproductive/Obstetrics negative OB ROS S/p hysterectomy                              Anesthesia Physical Anesthesia Plan  ASA: III  Anesthesia Plan: General   Post-op Pain Management:    Induction: Intravenous  PONV Risk Score and Plan: 4 or greater and Ondansetron, Dexamethasone, Midazolam, Scopolamine patch - Pre-op and Treatment may vary due to age or medical condition  Airway Management Planned: Oral ETT  Additional Equipment: None  Intra-op Plan:   Post-operative Plan: Extubation in OR  Informed Consent: I have reviewed the patients History and Physical, chart, labs and discussed the procedure including the risks, benefits and alternatives for the proposed anesthesia with the patient or authorized representative who has indicated his/her understanding and acceptance.     Dental advisory  given  Plan Discussed with: CRNA  Anesthesia Plan Comments:         Anesthesia Quick Evaluation

## 2019-05-29 LAB — SURGICAL PATHOLOGY

## 2019-08-30 ENCOUNTER — Other Ambulatory Visit (HOSPITAL_BASED_OUTPATIENT_CLINIC_OR_DEPARTMENT_OTHER): Payer: Self-pay

## 2019-08-30 DIAGNOSIS — R0683 Snoring: Secondary | ICD-10-CM

## 2019-08-30 DIAGNOSIS — G4709 Other insomnia: Secondary | ICD-10-CM

## 2019-09-05 ENCOUNTER — Other Ambulatory Visit: Payer: Self-pay

## 2019-09-05 ENCOUNTER — Ambulatory Visit: Payer: BC Managed Care – PPO | Attending: General Practice | Admitting: Neurology

## 2019-09-05 DIAGNOSIS — G4709 Other insomnia: Secondary | ICD-10-CM

## 2019-09-05 DIAGNOSIS — G4700 Insomnia, unspecified: Secondary | ICD-10-CM | POA: Diagnosis not present

## 2019-09-05 DIAGNOSIS — R0683 Snoring: Secondary | ICD-10-CM | POA: Insufficient documentation

## 2019-09-05 DIAGNOSIS — Z7984 Long term (current) use of oral hypoglycemic drugs: Secondary | ICD-10-CM | POA: Insufficient documentation

## 2019-09-05 DIAGNOSIS — G4733 Obstructive sleep apnea (adult) (pediatric): Secondary | ICD-10-CM | POA: Diagnosis not present

## 2019-09-05 DIAGNOSIS — Z79899 Other long term (current) drug therapy: Secondary | ICD-10-CM | POA: Insufficient documentation

## 2019-09-18 NOTE — Procedures (Signed)
  [place] A. Merlene Laughter, MD     [email or web address]             HOME SLEEP STUDY   LOCATION: [facility]  Patient Name: Tyan, Lasure Date: 09/05/2019 Gender: Female D.O.B: [birth year] Age (years): 9 Referring Provider: Leda Gauze NP Height (inches): 48 Interpreting Physician: Phillips Odor MD, ABSM Weight (lbs): 293 RPSGT: Rosebud Poles BMI: 66 MRN: [id number] Neck Size: CLINICAL INFORMATION Sleep Study Type: HST     Indication for sleep study: N/A     Epworth Sleepiness Score: N/A  SLEEP STUDY TECHNIQUE A multi-channel overnight portable sleep study was performed. The channels recorded were: nasal airflow, thoracic respiratory movement, and oxygen saturation with a pulse oximetry. Snoring was also monitored.  MEDICATIONS Patient self administered medications include: N/A.  Current Outpatient Medications:  .  ACCU-CHEK FASTCLIX LANCETS MISC, 1 each by Does not apply route 4 (four) times daily., Disp: 150 each, Rfl: 5 .  atorvastatin (LIPITOR) 10 MG tablet, Take 10 mg by mouth daily., Disp: , Rfl:  .  busPIRone (BUSPAR) 5 MG tablet, Take 5 mg by mouth 2 (two) times daily., Disp: , Rfl:  .  CRANBERRY PO, Take 1 tablet by mouth daily., Disp: , Rfl:  .  glimepiride (AMARYL) 4 MG tablet, Take 2-4 mg by mouth See admin instructions. Take 1 tablet (4 mg) by mouth in the morning & take 0.5 tablet (2 mg) by mouth in the evening., Disp: , Rfl:  .  glucose blood (ACCU-CHEK GUIDE) test strip, Use as instructed bid. E11.65, Disp: 150 each, Rfl: 5 .  HYDROcodone-acetaminophen (NORCO/VICODIN) 5-325 MG tablet, Take 1 tablet by mouth every 6 (six) hours as needed for moderate pain., Disp: 15 tablet, Rfl: 0 .  hydrOXYzine (VISTARIL) 50 MG capsule, Take 100 mg by mouth at bedtime., Disp: , Rfl:  .  lisinopril-hydrochlorothiazide (ZESTORETIC) 20-25 MG tablet, Take 1 tablet by mouth daily., Disp: , Rfl:  .  metFORMIN (GLUCOPHAGE) 1000 MG tablet, Take 1,000  mg by mouth 2 (two) times daily., Disp: , Rfl:  .  Multiple Vitamin (MULTIVITAMIN WITH MINERALS) TABS tablet, Take 1 tablet by mouth daily., Disp: , Rfl:  .  omeprazole (PRILOSEC) 20 MG capsule, Take 20 mg by mouth daily., Disp: , Rfl:  .  ondansetron (ZOFRAN) 4 MG tablet, Take 4 mg by mouth every 4 (four) hours as needed for nausea/vomiting., Disp: , Rfl:  .  promethazine (PHENERGAN) 25 MG tablet, Take 25 mg by mouth every 8 (eight) hours as needed for nausea/vomiting., Disp: , Rfl:  .  venlafaxine XR (EFFEXOR-XR) 37.5 MG 24 hr capsule, Take 37.5 mg by mouth daily., Disp: , Rfl:    SLEEP ARCHITECTURE Patient was studied for 593.4 minutes. The sleep efficiency was 98.9 % and the patient was supine for 51.7%. The arousal index was 0.0 per hour.  RESPIRATORY PARAMETERS The overall AHI was 5.9 per hour, with a central apnea index of 0.2 per hour.  The oxygen nadir was 81% during sleep.     CARDIAC DATA Mean heart rate during sleep was 76.5 bpm.  IMPRESSIONS Mild obstructive sleep apnea occurred during this study (AHI = 5.9/h).  The severity does not require positive pressure treatment.   Delano Metz, MD Diplomate, American Board of Sleep Medicine.   ELECTRONICALLY SIGNED ON:  09/18/2019, 8:22 AM [place] PH: [phone]   FX: [phone] [place]

## 2020-06-09 IMAGING — RF DG UGI W/ HIGH DENSITY W/O KUB
13 of 14 series · 14 of 24 positions shown · non-contrast
Comparison: abdominal radiographic series 07/24/2018.

CLINICAL DATA: 39-year-old female with planned cholecystectomy.
History of abdominal pain. History of gastroesophageal reflux for
which she is maintained on medication.

EXAM:
UPPER GI SERIES WITH KUB
TECHNIQUE: After obtaining a scout radiograph a routine upper GI series was
performed using effervescent crystals and barium.
FLUOROSCOPY TIME:  Fluoroscopy Time:  2 minutes 18 seconds
Radiation Exposure Index (if provided by the fluoroscopic device):
1,224 mGy
Number of Acquired Spot Images: 0

[Series 1: one shot · 0.14mm/px · 1 of 1 slices shown (1 of 7)]
[im 1/1]
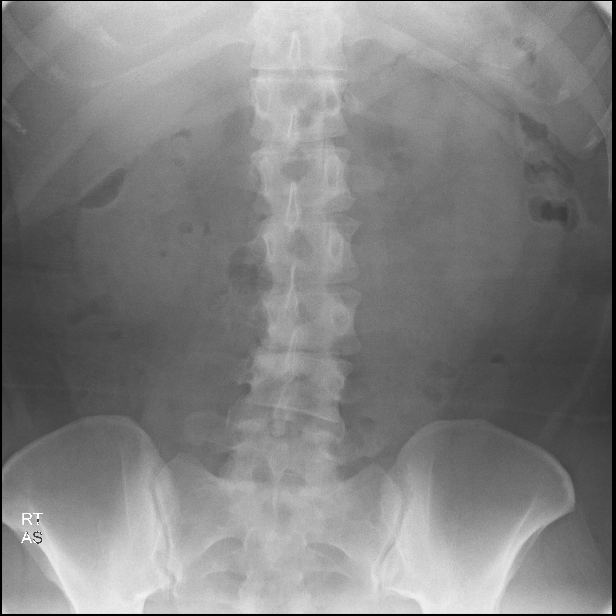

[Series 3: one shot · 1 of 4 slices shown (2 of 7)]
[im 1/4]
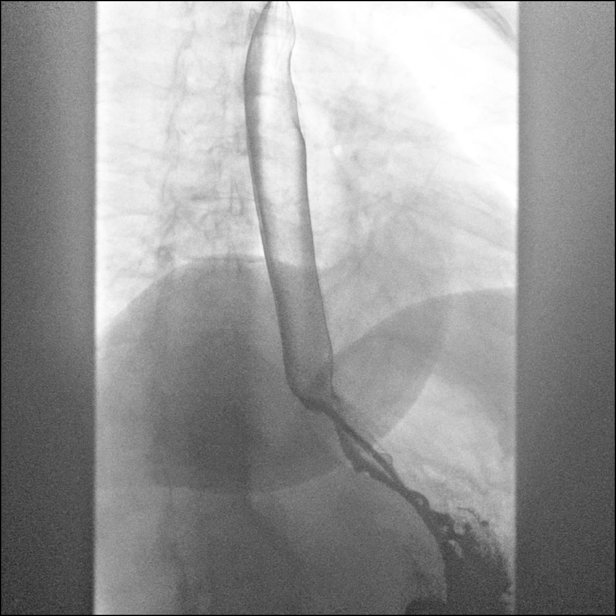

[Series 4: sequence · 1 of 18 frames shown (1 of 6)]
[frame 3/18]
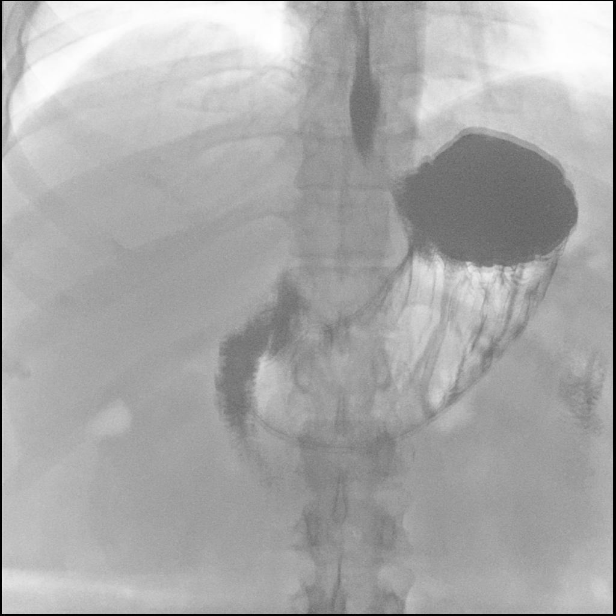

[Series 5: one shot · 1 of 1 slices shown (3 of 7)]
[im 1/1]
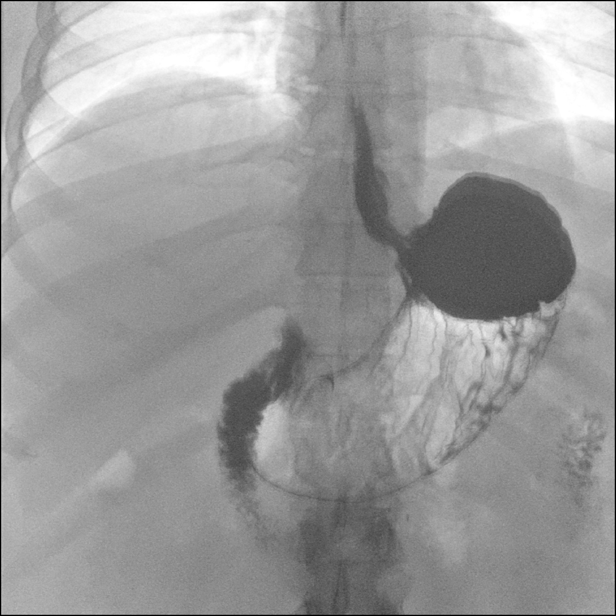

[Series 6: sequence · 1 of 17 frames shown (2 of 6)]
[frame 8/17]
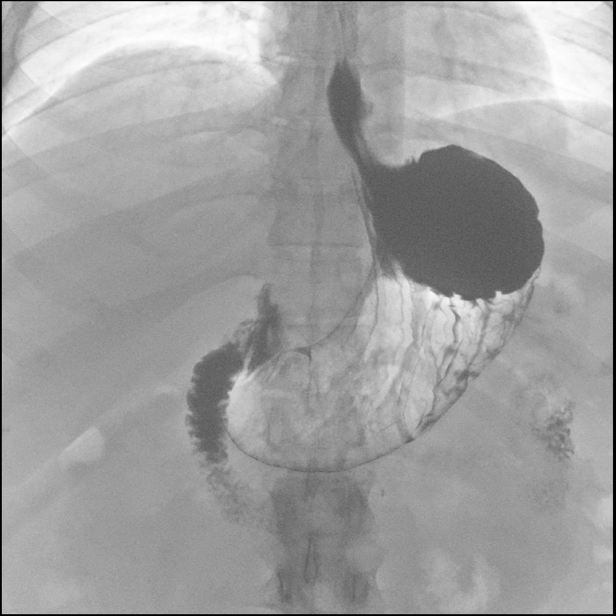

[Series 7: one shot · 1 of 4 slices shown (4 of 7)]
[im 1/4]
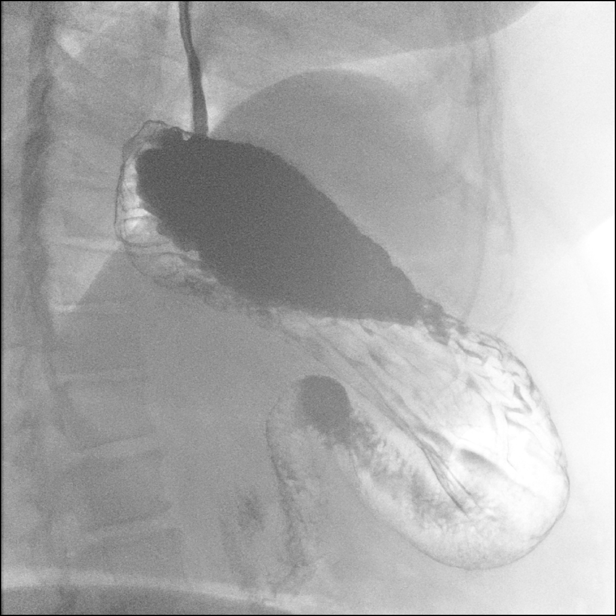

[Series 8: sequence · 2 of 10 frames shown (3 of 6)]
[frame 1/10]
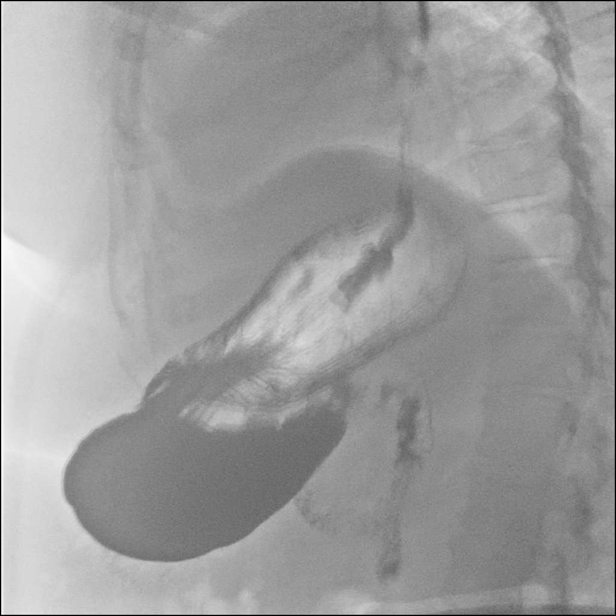
[frame 6/10]
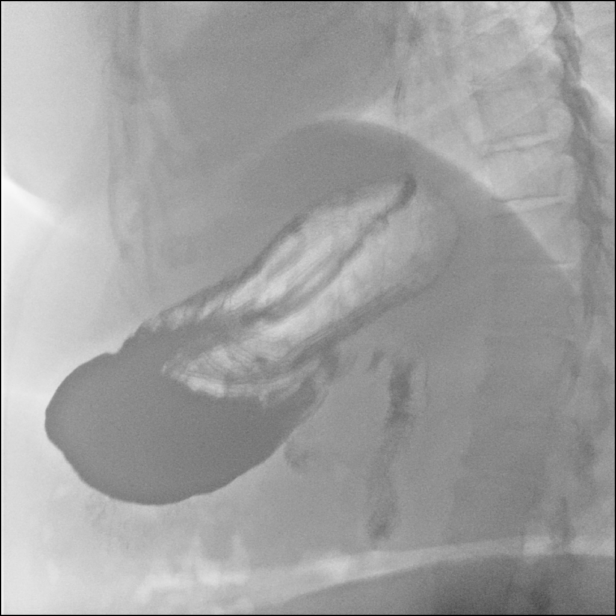

[Series 9: one shot · 1 of 3 slices shown (5 of 7)]
[im 3/3]
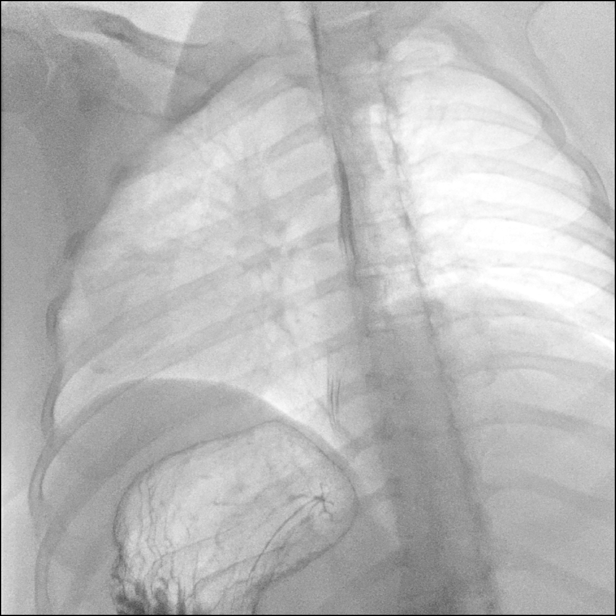

[Series 10: sequence · 1 of 15 frames shown (4 of 6)]
[frame 13/15]
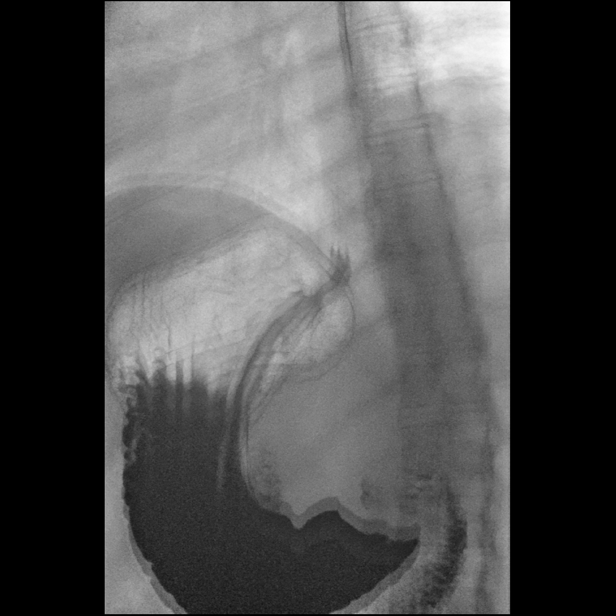

[Series 11: sequence · 1 of 33 frames shown (5 of 6)]
[frame 29/33]
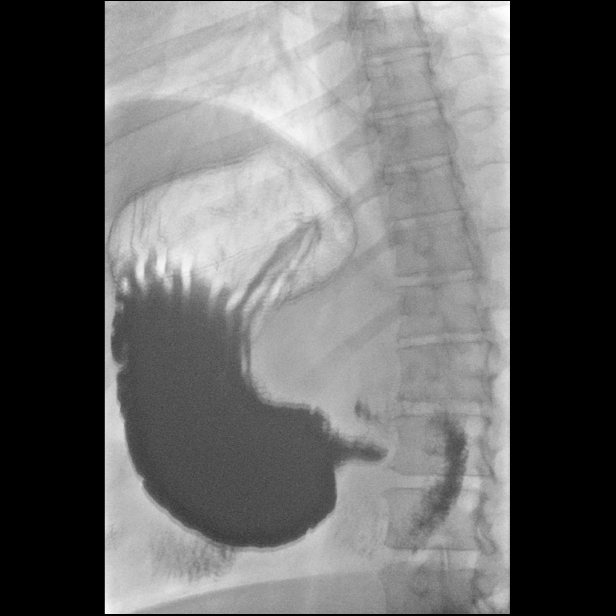

[Series 12: one shot · 1 of 2 slices shown (6 of 7)]
[im 2/2]
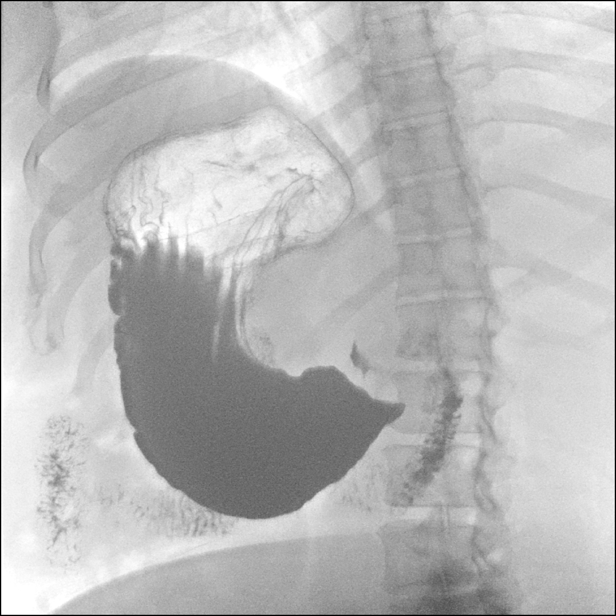

[Series 13: sequence · 1 of 40 frames shown (6 of 6)]
[frame 21/40]
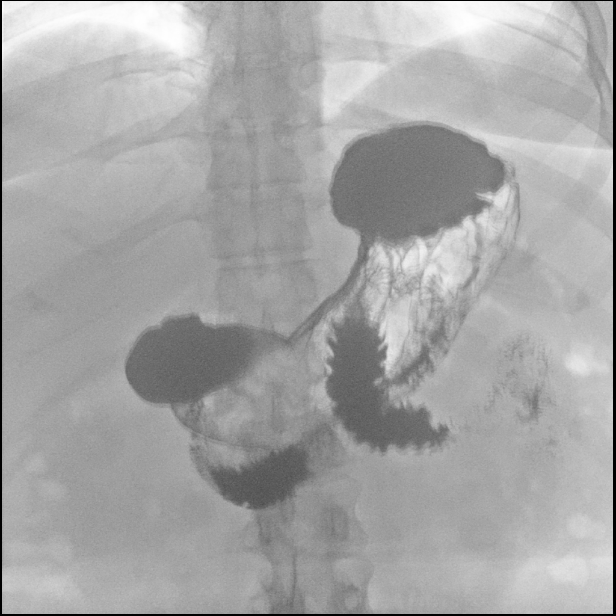

[Series 14: one shot · 1 of 3 slices shown (7 of 7)]
[im 3/3]
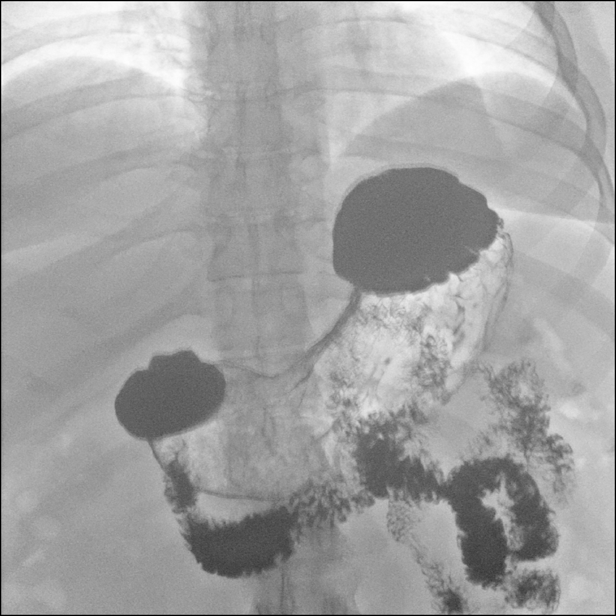

[14 of 24 positions shown; findings below may reference images not displayed]

FINDINGS: Preprocedural scout view of the abdomen.

Stable mild to moderate levoconvex lumbar scoliosis with some
associated chronic lumbar disc and endplate degeneration. No acute
osseous abnormality identified. Normal visible bowel gas pattern,
abdominal visceral contours.

A double contrast study was then undertaken and the patient
tolerated this well and without difficulty.

No obstruction to the forward flow of contrast throughout the
esophagus and into the stomach. Normal esophageal course and
contour. Normal esophageal mucosal pattern.

Normal gastroesophageal junction. Normal gastric contour. Adequate
gastric coating with barium. Normal gastric mucosal pattern.

When the patient was first brought supine a small volume of
spontaneous gastroesophageal reflux was observed (series 4 and
series 6).

Prompt gastric emptying. Normal duodenum C-loop configuration which
was best visualized on series 13. Duodenum mucosal pattern is within
normal limits.

While RAO single-contrast swallows demonstrate normal esophageal
motility (series 11). Gastroesophageal junction remains normal.

At the conclusion of the exam no additional gastroesophageal reflux
occurred spontaneously or was elicited.

Opacified proximal small bowel loops appear normal.
IMPRESSION: Normal Upper GI aside from mild/intermittent gastroesophageal
reflux.

## 2020-08-19 ENCOUNTER — Encounter: Payer: Self-pay | Admitting: Neurology

## 2020-10-21 ENCOUNTER — Encounter: Payer: Self-pay | Admitting: Gastroenterology

## 2020-10-21 ENCOUNTER — Ambulatory Visit: Payer: BC Managed Care – PPO | Admitting: Gastroenterology

## 2020-10-23 ENCOUNTER — Ambulatory Visit: Payer: BC Managed Care – PPO | Admitting: Neurology

## 2021-01-01 ENCOUNTER — Ambulatory Visit: Payer: Self-pay | Admitting: Neurology

## 2021-02-19 ENCOUNTER — Other Ambulatory Visit (HOSPITAL_COMMUNITY): Payer: Self-pay | Admitting: Family Medicine

## 2021-02-19 ENCOUNTER — Other Ambulatory Visit: Payer: Self-pay | Admitting: Family Medicine

## 2021-02-19 DIAGNOSIS — M25552 Pain in left hip: Secondary | ICD-10-CM

## 2021-03-04 ENCOUNTER — Ambulatory Visit (HOSPITAL_COMMUNITY): Payer: 59

## 2021-03-04 ENCOUNTER — Encounter (HOSPITAL_COMMUNITY): Payer: Self-pay

## 2021-03-05 ENCOUNTER — Ambulatory Visit: Payer: Self-pay | Admitting: Neurology

## 2021-03-12 ENCOUNTER — Encounter (HOSPITAL_COMMUNITY): Payer: Self-pay

## 2021-03-12 ENCOUNTER — Ambulatory Visit (HOSPITAL_COMMUNITY): Payer: 59 | Attending: Family Medicine

## 2021-04-23 ENCOUNTER — Ambulatory Visit: Payer: 59 | Admitting: Neurology

## 2021-05-12 ENCOUNTER — Ambulatory Visit
Admission: RE | Admit: 2021-05-12 | Discharge: 2021-05-12 | Disposition: A | Discharge status: Home / Self Care | Payer: 59 | Source: Ambulatory Visit | Attending: Family Medicine | Admitting: Family Medicine

## 2021-05-12 DIAGNOSIS — M25552 Pain in left hip: Secondary | ICD-10-CM | POA: Diagnosis present

## 2021-10-12 NOTE — Progress Notes (Deleted)
[facility] Clinic Note - Initial Visit   Date: 10/13/2021   Robin Martin MRN: [id number] DOB: 10-02-1980   Dear Dr Domenic Moras, [place] Dragon, FNP:  Thank you for your kind referral of Robin Martin for consultation of ***. Although her history is well known to you, please allow [place] to reiterate it for the purpose of our medical record. The patient was accompanied to the clinic by *** who also provides collateral information.     Robin Martin is a 41 y.o. ***-handed female with *** presenting for evaluation of ***.   IMPRESSION/PLAN: ****  Return to clinic in ***  ------------------------------------------------------------- History of present illness: ***  Out-side paper records, electronic medical record, and images have been reviewed where available and summarized as: *** Lab Results  Component Value Date   HGBA1C 7.6 (H) 05/21/2019   No results found for: "VITAMINB12" No results found for: "TSH" No results found for: "ESRSEDRATE", "POCTSEDRATE"  Past Medical History:  Diagnosis Date   Abnormal Pap smear    Bipolar 1 disorder ([place])    Cervical cancer ([place]) 2005   LEEP-cervical cancer   DDD (degenerative disc disease), lumbar    Essential hypertension    GERD (gastroesophageal reflux disease)    Gestational diabetes mellitus, antepartum    Heartburn in pregnancy    Herpes    Nephrolithiasis    Ovarian cyst    age 49   Postpartum depression    Sciatic pain    Scoliosis    Tendonitis    Vision problems     Past Surgical History:  Procedure Laterality Date   CESAREAN SECTION     x2--2005 and 2014   CESAREAN SECTION WITH BILATERAL TUBAL LIGATION Bilateral 04/09/2012   BTL not done   CESAREAN SECTION WITH BILATERAL TUBAL LIGATION Bilateral 11/06/2014   Procedure: REPEAT CESAREAN SECTION WITH BILATERAL TUBAL LIGATION REMOVAL OF SKIN TAG;  Surgeon: Crawford Givens, MD;  Location: [facility];  Service: Obstetrics;  Laterality:  Bilateral;   CHOLECYSTECTOMY N/A 05/28/2019   Procedure: LAPAROSCOPIC CHOLECYSTECTOMY WITH INTRAOPERATIVE CHOLANGIOGRAM;  Surgeon: Johnathan Hausen, MD;  Location: [facility];  Service: General;  Laterality: N/A;   CYSTOSCOPY N/A 04/24/2015   Procedure: CYSTOSCOPY;  Surgeon: Everett Graff, MD;  Location: [facility];  Service: Gynecology;  Laterality: N/A;   LAPAROSCOPIC HYSTERECTOMY Bilateral 04/24/2015   Procedure: HYSTERECTOMY TOTAL LAPAROSCOPIC bilateral salpingectomy ;  Surgeon: Everett Graff, MD;  Location: [facility];  Service: Gynecology;  Laterality: Bilateral;   LEEP     TYMPANOSTOMY TUBE PLACEMENT       Medications:  Outpatient Encounter Medications as of 10/13/2021  Medication Sig   ACCU-CHEK FASTCLIX LANCETS MISC 1 each by Does not apply route 4 (four) times daily.   atorvastatin (LIPITOR) 10 MG tablet Take 10 mg by mouth daily.   busPIRone (BUSPAR) 5 MG tablet Take 5 mg by mouth 2 (two) times daily.   CRANBERRY PO Take 1 tablet by mouth daily.   glimepiride (AMARYL) 4 MG tablet Take 2-4 mg by mouth See admin instructions. Take 1 tablet (4 mg) by mouth in the morning & take 0.5 tablet (2 mg) by mouth in the evening.   glucose blood (ACCU-CHEK GUIDE) test strip Use as instructed bid. E11.65   HYDROcodone-acetaminophen (NORCO/VICODIN) 5-325 MG tablet Take 1 tablet by mouth every 6 (six) hours as needed for moderate pain.   hydrOXYzine (VISTARIL) 50 MG capsule Take 100 mg by mouth at bedtime.   lisinopril-hydrochlorothiazide (ZESTORETIC) 20-25 MG tablet  Take 1 tablet by mouth daily.   metFORMIN (GLUCOPHAGE) 1000 MG tablet Take 1,000 mg by mouth 2 (two) times daily.   Multiple Vitamin (MULTIVITAMIN WITH MINERALS) TABS tablet Take 1 tablet by mouth daily.   omeprazole (PRILOSEC) 20 MG capsule Take 20 mg by mouth daily.   ondansetron (ZOFRAN) 4 MG tablet Take 4 mg by mouth every 4 (four) hours as needed for nausea/vomiting.   promethazine (PHENERGAN) 25 MG tablet Take 25 mg by mouth every 8 (eight)  hours as needed for nausea/vomiting.   venlafaxine XR (EFFEXOR-XR) 37.5 MG 24 hr capsule Take 37.5 mg by mouth daily.   No facility-administered encounter medications on file as of 10/13/2021.    Allergies:  Allergies  Allergen Reactions   Chocolate Other (See Comments)    Thins blood   Latex Rash   Morphine And Related Itching, Nausea And Vomiting and Rash    Family History: Family History  Problem Relation Age of Onset   Diabetes Mother    Hypertension Mother    Mental illness Mother    Diabetes Father    Hypertension Father    Heart disease Father    Heart attack Father    Hyperlipidemia Father    Other Father        heart surgery   Asthma Sister    Diabetes Sister    Diabetes Maternal Grandmother    Hypertension Maternal Grandmother    Stroke Maternal Grandmother    Hypertension Paternal Grandmother    Hypertension Maternal Grandfather    Heart attack Maternal Grandfather    Other Maternal Grandfather        heart surgery    Social History: Social History   Tobacco Use   Smoking status: Former    Packs/day: 0.50    Types: Cigarettes    Quit date: 03/22/2012    Years since quitting: 9.5   Smokeless tobacco: Never  Vaping Use   Vaping Use: Never used  Substance Use Topics   Alcohol use: No    Comment: occasional   Drug use: No   Social History   Social History Narrative   Not on file    Vital Signs:  LMP 02/14/2014    General Medical Exam:  *** General:  Well appearing, comfortable.   Eyes/ENT: see cranial nerve examination.   Neck:   No carotid bruits. Respiratory:  Clear to auscultation, good air entry bilaterally.   Cardiac:  Regular rate and rhythm, no murmur.   Extremities:  No deformities, edema, or skin discoloration.  Skin:  No rashes or lesions.  Neurological Exam: MENTAL STATUS including orientation to time, place, person, recent and remote memory, attention span and concentration, language, and fund of knowledge is ***normal.   Speech is not dysarthric.  CRANIAL NERVES: II:  No visual field defects.  Unremarkable fundi.   III-IV-VI: Pupils equal round and reactive to light.  Normal conjugate, extra-ocular eye movements in all directions of gaze.  No nystagmus.  No ptosis***.   V:  Normal facial sensation.    VII:  Normal facial symmetry and movements.   VIII:  Normal hearing and vestibular function.   IX-X:  Normal palatal movement.   XI:  Normal shoulder shrug and head rotation.   XII:  Normal tongue strength and range of motion, no deviation or fasciculation.  MOTOR:  No atrophy, fasciculations or abnormal movements.  No pronator drift.   Upper Extremity:  Right  Left  Deltoid  5/5   5/5  Biceps  5/5   5/5   Triceps  5/5   5/5   Infraspinatus 5/5  5/5  Medial pectoralis 5/5  5/5  Wrist extensors  5/5   5/5   Wrist flexors  5/5   5/5   Finger extensors  5/5   5/5   Finger flexors  5/5   5/5   Dorsal interossei  5/5   5/5   Abductor pollicis  5/5   5/5   Tone (Ashworth scale)  0  0   Lower Extremity:  Right  Left  Hip flexors  5/5   5/5   Hip extensors  5/5   5/5   Adductor 5/5  5/5  Abductor 5/5  5/5  Knee flexors  5/5   5/5   Knee extensors  5/5   5/5   Dorsiflexors  5/5   5/5   Plantarflexors  5/5   5/5   Toe extensors  5/5   5/5   Toe flexors  5/5   5/5   Tone (Ashworth scale)  0  0   MSRs:  Right        Left                  brachioradialis 2+  2+  biceps 2+  2+  triceps 2+  2+  patellar 2+  2+  ankle jerk 2+  2+  Hoffman no  no  plantar response down  down   SENSORY:  Normal and symmetric perception of light touch, pinprick, vibration, and proprioception.  Romberg's sign absent.   COORDINATION/GAIT: Normal finger-to- nose-finger and heel-to-shin.  Intact rapid alternating movements bilaterally.  Able to rise from a chair without using arms.  Gait narrow based and stable. Tandem and stressed gait intact.    ***   Thank you for allowing me to participate in patient's care.  If  I can answer any additional questions, I would be pleased to do so.    Sincerely,    Carnisha Feltz K. Posey Pronto, DO

## 2021-10-13 ENCOUNTER — Ambulatory Visit: Payer: Self-pay | Admitting: Neurology

## 2021-10-13 ENCOUNTER — Encounter: Payer: Self-pay | Admitting: Neurology

## 2021-10-13 DIAGNOSIS — Z029 Encounter for administrative examinations, unspecified: Secondary | ICD-10-CM

## 2022-11-11 ENCOUNTER — Ambulatory Visit (INDEPENDENT_AMBULATORY_CARE_PROVIDER_SITE_OTHER): Payer: No Typology Code available for payment source

## 2022-11-16 ENCOUNTER — Ambulatory Visit (INDEPENDENT_AMBULATORY_CARE_PROVIDER_SITE_OTHER): Payer: No Typology Code available for payment source

## 2023-01-26 ENCOUNTER — Other Ambulatory Visit: Payer: Self-pay

## 2023-01-26 ENCOUNTER — Encounter (HOSPITAL_COMMUNITY): Payer: Self-pay

## 2023-01-26 ENCOUNTER — Emergency Department (HOSPITAL_COMMUNITY)
Admission: EM | Admit: 2023-01-26 | Discharge: 2023-01-27 | Disposition: A | Discharge status: Home / Self Care | Payer: No Typology Code available for payment source | Attending: Emergency Medicine | Admitting: Emergency Medicine

## 2023-01-26 ENCOUNTER — Emergency Department (HOSPITAL_COMMUNITY): Payer: No Typology Code available for payment source

## 2023-01-26 DIAGNOSIS — S3992XA Unspecified injury of lower back, initial encounter: Secondary | ICD-10-CM | POA: Diagnosis not present

## 2023-01-26 DIAGNOSIS — W01198A Fall on same level from slipping, tripping and stumbling with subsequent striking against other object, initial encounter: Secondary | ICD-10-CM | POA: Diagnosis not present

## 2023-01-26 DIAGNOSIS — Z91040 Latex allergy status: Secondary | ICD-10-CM | POA: Diagnosis not present

## 2023-01-26 DIAGNOSIS — S0990XA Unspecified injury of head, initial encounter: Secondary | ICD-10-CM | POA: Diagnosis present

## 2023-01-26 NOTE — ED Triage Notes (Signed)
 Pt presents to ED from home C/O fall resulting in headache and sacral pain. Pt reports LOC lasting "a few minutes," witnessed by children.

## 2023-01-27 MED ORDER — HYDROCODONE-ACETAMINOPHEN 5-325 MG PO TABS: 1.0000 | ORAL_TABLET | ORAL | 0 refills | Status: DC | PRN

## 2023-01-27 MED ORDER — DOCUSATE SODIUM 100 MG PO CAPS: 200.0000 mg | ORAL_CAPSULE | Freq: Two times a day (BID) | ORAL | 0 refills | Status: AC

## 2023-01-27 MED ORDER — IBUPROFEN 800 MG PO TABS: 800.0000 mg | ORAL_TABLET | Freq: Four times a day (QID) | ORAL | 0 refills | Status: AC | PRN

## 2023-01-27 MED ORDER — IBUPROFEN 800 MG PO TABS
800.0000 mg | ORAL_TABLET | Freq: Once | ORAL | Status: AC
  Administered 2023-01-27: 800 mg via ORAL
  Filled 2023-01-27: qty 1

## 2023-01-27 MED ORDER — HYDROCODONE-ACETAMINOPHEN 5-325 MG PO TABS
1.0000 | ORAL_TABLET | Freq: Once | ORAL | Status: AC
  Administered 2023-01-27: 1 via ORAL
  Filled 2023-01-27: qty 1

## 2023-01-27 NOTE — ED Provider Notes (Signed)
  EMERGENCY DEPARTMENT AT [facility] Provider Note   CSN: [id number] Arrival date & time: 01/26/23  1818     History  Chief Complaint  Patient presents with   Robin Martin is a 43 y.o. female.  Presents to the emergency department for evaluation after a fall.  Patient reports that she did hit her head and there was brief loss of consciousness.  Patient with persistent headache and also tailbone pain after the fall.  She has been ambulatory.  She does not take blood thinners.       Home Medications Prior to Admission medications   Medication Sig Start Date End Date Taking? Authorizing Provider  docusate sodium  (COLACE) 100 MG capsule Take 2 capsules (200 mg total) by mouth every 12 (twelve) hours. 01/27/23  Yes Emani Morad, Lonni PARAS, MD  HYDROcodone -acetaminophen  (NORCO/VICODIN) 5-325 MG tablet Take 1 tablet by mouth every 4 (four) hours as needed. 01/27/23  Yes Leavy Heatherly, Lonni PARAS, MD  ibuprofen  (ADVIL ) 800 MG tablet Take 1 tablet (800 mg total) by mouth every 6 (six) hours as needed for moderate pain (pain score 4-6). 01/27/23  Yes Kadin Canipe, Lonni PARAS, MD  ACCU-CHEK FASTCLIX LANCETS MISC 1 each by Does not apply route 4 (four) times daily. 08/02/17   Nida, Gebreselassie W, MD  atorvastatin (LIPITOR) 10 MG tablet Take 10 mg by mouth daily.    [provider]  busPIRone (BUSPAR) 5 MG tablet Take 5 mg by mouth 2 (two) times daily. 03/26/19   [provider]  CRANBERRY PO Take 1 tablet by mouth daily.    [provider]  glimepiride (AMARYL) 4 MG tablet Take 2-4 mg by mouth See admin instructions. Take 1 tablet (4 mg) by mouth in the morning & take 0.5 tablet (2 mg) by mouth in the evening. 05/06/19   [provider]  glucose blood (ACCU-CHEK GUIDE) test strip Use as instructed bid. E11.65 07/25/17   Nida, Gebreselassie W, MD  hydrOXYzine (VISTARIL) 50 MG capsule Take 100 mg by mouth at bedtime. 04/17/19   [provider]  lisinopril-hydrochlorothiazide  (ZESTORETIC) 20-25 MG tablet Take 1 tablet by mouth daily. 04/28/19   [provider]  metFORMIN (GLUCOPHAGE) 1000 MG tablet Take 1,000 mg by mouth 2 (two) times daily. 05/06/19   [provider]  Multiple Vitamin (MULTIVITAMIN WITH MINERALS) TABS tablet Take 1 tablet by mouth daily.    [provider]  omeprazole (PRILOSEC) 20 MG capsule Take 20 mg by mouth daily.    [provider]  ondansetron  (ZOFRAN ) 4 MG tablet Take 4 mg by mouth every 4 (four) hours as needed for nausea/vomiting. 04/03/19   [provider]  promethazine  (PHENERGAN ) 25 MG tablet Take 25 mg by mouth every 8 (eight) hours as needed for nausea/vomiting. 04/03/19   [provider]  venlafaxine XR (EFFEXOR-XR) 37.5 MG 24 hr capsule Take 37.5 mg by mouth daily. 04/17/19   [provider]      Allergies    Chocolate, Latex, and Morphine  and codeine    Review of Systems   Review of Systems  Physical Exam Updated Vital Signs BP 118/78 (BP Location: Right Arm)   Pulse 76   Temp 98.7 F (37.1 C)   Resp 16   Ht 5' 7 (1.702 m)   Wt 117.9 kg   LMP 02/14/2014   SpO2 99%   BMI 40.72 kg/m  Physical Exam Vitals and nursing note reviewed.  Constitutional:  General: She is not in acute distress.    Appearance: She is well-developed.  HENT:     Head: Normocephalic and atraumatic.     Mouth/Throat:     Mouth: Mucous membranes are moist.  Eyes:     General: Vision grossly intact. Gaze aligned appropriately.     Extraocular Movements: Extraocular movements intact.     Conjunctiva/sclera: Conjunctivae normal.  Cardiovascular:     Rate and Rhythm: Normal rate and regular rhythm.     Pulses: Normal pulses.     Heart sounds: Normal heart sounds, S1 normal and S2 normal. No murmur heard.    No friction rub. No gallop.  Pulmonary:     Effort: Pulmonary effort is normal. No respiratory distress.     Breath sounds:  Normal breath sounds.  Abdominal:     General: Bowel sounds are normal.     Palpations: Abdomen is soft.     Tenderness: There is no abdominal tenderness. There is no guarding or rebound.     Hernia: No hernia is present.  Musculoskeletal:        General: No swelling.     Cervical back: Full passive range of motion without pain, normal range of motion and neck supple. No spinous process tenderness or muscular tenderness. Normal range of motion.       Back:     Right lower leg: No edema.     Left lower leg: No edema.  Skin:    General: Skin is warm and dry.     Capillary Refill: Capillary refill takes less than 2 seconds.     Findings: No ecchymosis, erythema, rash or wound.  Neurological:     General: No focal deficit present.     Mental Status: She is alert and oriented to person, place, and time.     GCS: GCS eye subscore is 4. GCS verbal subscore is 5. GCS motor subscore is 6.     Cranial Nerves: Cranial nerves 2-12 are intact.     Sensory: Sensation is intact.     Motor: Motor function is intact.     Coordination: Coordination is intact.  Psychiatric:        Attention and Perception: Attention normal.        Mood and Affect: Mood normal.        Speech: Speech normal.        Behavior: Behavior normal.     ED Results / Procedures / Treatments   Labs (all labs ordered are listed, but only abnormal results are displayed) Labs Reviewed - No data to display  EKG None  Radiology CT Head Wo Contrast Result Date: 01/26/2023 CLINICAL DATA:  Head trauma abnormal mental status headache EXAM: CT HEAD WITHOUT CONTRAST TECHNIQUE: Contiguous axial images were obtained from the base of the skull through the vertex without intravenous contrast. RADIATION DOSE REDUCTION: This exam was performed according to the departmental dose-optimization program which includes automated exposure control, adjustment of the mA and/or kV according to patient size and/or use of iterative reconstruction  technique. COMPARISON:  None Available. FINDINGS: Brain: No evidence of acute infarction, hemorrhage, hydrocephalus, extra-axial collection or mass lesion/mass effect. Vascular: No hyperdense vessel or unexpected calcification. Skull: Normal. Negative for fracture or focal lesion. Sinuses/Orbits: No acute finding. Other: None IMPRESSION: Negative non contrasted CT appearance of the brain. Electronically Signed   By: Luke Bun M.D.   On: 01/26/2023 20:51   DG Pelvis 1-2 Views Result Date: 01/26/2023 CLINICAL DATA:  Fall with  sacral pain EXAM: PELVIS - 1-2 VIEW COMPARISON:  None Available. FINDINGS: There is no evidence of pelvic fracture or diastasis. No pelvic bone lesions are seen. IMPRESSION: Negative. Electronically Signed   By: Luke Bun M.D.   On: 01/26/2023 20:49    Procedures Procedures    Medications Ordered in ED Medications  HYDROcodone -acetaminophen  (NORCO/VICODIN) 5-325 MG per tablet 1 tablet (has no administration in time range)  ibuprofen  (ADVIL ) tablet 800 mg (has no administration in time range)    ED Course/ Medical Decision Making/ A&P                                 Medical Decision Making Amount and/or Complexity of Data Reviewed Radiology: ordered.  Risk Prescription drug management.   Presents for evaluation after a fall.  She hit her head and there was loss of consciousness.  Patient now awake, alert, oriented, no focal neurologic deficits.  CT head unremarkable.  No neck tenderness, cervical spine cleared by Nexus criteria.  Patient without thoracic or lumbar tenderness in the midline.  She does have some coccygeal area tenderness without crepitance.  Pelvic x-ray without fracture.        Final Clinical Impression(s) / ED Diagnoses Final diagnoses:  Injury of head, initial encounter  Tailbone injury, initial encounter    Rx / DC Orders ED Discharge Orders          Ordered    HYDROcodone -acetaminophen  (NORCO/VICODIN) 5-325 MG tablet  Every  4 hours PRN        01/27/23 0042    ibuprofen  (ADVIL ) 800 MG tablet  Every 6 hours PRN        01/27/23 0042    docusate sodium  (COLACE) 100 MG capsule  Every 12 hours        01/27/23 0042              Haze Lonni PARAS, MD 01/27/23 [phone]

## 2023-02-08 ENCOUNTER — Encounter: Payer: Self-pay | Admitting: Gastroenterology

## 2023-02-08 ENCOUNTER — Ambulatory Visit: Payer: No Typology Code available for payment source | Admitting: Gastroenterology

## 2023-02-08 NOTE — Progress Notes (Deleted)
GI Office Note    Referring Provider: Shelby Dubin, FNP Primary Care Physician:  Shelby Dubin, FNP  Primary Gastroenterologist:  Chief Complaint   No chief complaint on file.    History of Present Illness   Robin Martin is a 43 y.o. female presenting today at the request of Dr. Leandro Reasoner for further evaluation of GERD, melena.         Medications   Current Outpatient Medications  Medication Sig Dispense Refill   ACCU-CHEK FASTCLIX LANCETS MISC 1 each by Does not apply route 4 (four) times daily. 150 each 5   atorvastatin (LIPITOR) 10 MG tablet Take 10 mg by mouth daily.     busPIRone (BUSPAR) 5 MG tablet Take 5 mg by mouth 2 (two) times daily.     CRANBERRY PO Take 1 tablet by mouth daily.     docusate sodium (COLACE) 100 MG capsule Take 2 capsules (200 mg total) by mouth every 12 (twelve) hours. 20 capsule 0   glimepiride (AMARYL) 4 MG tablet Take 2-4 mg by mouth See admin instructions. Take 1 tablet (4 mg) by mouth in the morning & take 0.5 tablet (2 mg) by mouth in the evening.     glucose blood (ACCU-CHEK GUIDE) test strip Use as instructed bid. E11.65 150 each 5   HYDROcodone-acetaminophen (NORCO/VICODIN) 5-325 MG tablet Take 1 tablet by mouth every 4 (four) hours as needed. 10 tablet 0   hydrOXYzine (VISTARIL) 50 MG capsule Take 100 mg by mouth at bedtime.     ibuprofen (ADVIL) 800 MG tablet Take 1 tablet (800 mg total) by mouth every 6 (six) hours as needed for moderate pain (pain score 4-6). 20 tablet 0   lisinopril-hydrochlorothiazide (ZESTORETIC) 20-25 MG tablet Take 1 tablet by mouth daily.     metFORMIN (GLUCOPHAGE) 1000 MG tablet Take 1,000 mg by mouth 2 (two) times daily.     Multiple Vitamin (MULTIVITAMIN WITH MINERALS) TABS tablet Take 1 tablet by mouth daily.     omeprazole (PRILOSEC) 20 MG capsule Take 20 mg by mouth daily.     ondansetron (ZOFRAN) 4 MG tablet Take 4 mg by mouth every 4 (four) hours as needed for nausea/vomiting.     promethazine  (PHENERGAN) 25 MG tablet Take 25 mg by mouth every 8 (eight) hours as needed for nausea/vomiting.     venlafaxine XR (EFFEXOR-XR) 37.5 MG 24 hr capsule Take 37.5 mg by mouth daily.     No current facility-administered medications for this visit.    Allergies   Allergies as of 02/08/2023 - Review Complete 01/26/2023  Allergen Reaction Noted   Chocolate Other (See Comments) 09/16/2010   Latex Rash 09/16/2010   Morphine and codeine Itching, Nausea And Vomiting, and Rash 09/16/2010    Past Medical History   Past Medical History:  Diagnosis Date   Abnormal Pap smear    Bipolar 1 disorder (HCC)    Cervical cancer (HCC) 2005   LEEP-cervical cancer   DDD (degenerative disc disease), lumbar    Essential hypertension    GERD (gastroesophageal reflux disease)    Gestational diabetes mellitus, antepartum    Heartburn in pregnancy    Herpes    Nephrolithiasis    Ovarian cyst    age 69   Postpartum depression    Sciatic pain    Scoliosis    Tendonitis    Vision problems     Past Surgical History   Past Surgical History:  Procedure Laterality Date  CESAREAN SECTION     x2--2005 and 2014   CESAREAN SECTION WITH BILATERAL TUBAL LIGATION Bilateral 04/09/2012   BTL not done   CESAREAN SECTION WITH BILATERAL TUBAL LIGATION Bilateral 11/06/2014   Procedure: REPEAT CESAREAN SECTION WITH BILATERAL TUBAL LIGATION REMOVAL OF SKIN TAG;  Surgeon: Jaymes Graff, MD;  Location: [facility];  Service: Obstetrics;  Laterality: Bilateral;   CHOLECYSTECTOMY N/A 05/28/2019   Procedure: LAPAROSCOPIC CHOLECYSTECTOMY WITH INTRAOPERATIVE CHOLANGIOGRAM;  Surgeon: Luretha Murphy, MD;  Location: [facility];  Service: General;  Laterality: N/A;   CYSTOSCOPY N/A 04/24/2015   Procedure: CYSTOSCOPY;  Surgeon: Osborn Coho, MD;  Location: [facility];  Service: Gynecology;  Laterality: N/A;   LAPAROSCOPIC HYSTERECTOMY Bilateral 04/24/2015   Procedure: HYSTERECTOMY TOTAL LAPAROSCOPIC bilateral salpingectomy ;  Surgeon:  Osborn Coho, MD;  Location: [facility];  Service: Gynecology;  Laterality: Bilateral;   LEEP     TYMPANOSTOMY TUBE PLACEMENT      Past Family History   Family History  Problem Relation Age of Onset   Diabetes Mother    Hypertension Mother    Mental illness Mother    Diabetes Father    Hypertension Father    Heart disease Father    Heart attack Father    Hyperlipidemia Father    Other Father        heart surgery   Asthma Sister    Diabetes Sister    Diabetes Maternal Grandmother    Hypertension Maternal Grandmother    Stroke Maternal Grandmother    Hypertension Paternal Grandmother    Hypertension Maternal Grandfather    Heart attack Maternal Grandfather    Other Maternal Grandfather        heart surgery    Past Social History   Social History   Socioeconomic History   Marital status: Married    Spouse name: Not on file   Number of children: Not on file   Years of education: Not on file   Highest education level: Not on file  Occupational History   Not on file  Tobacco Use   Smoking status: Former    Current packs/day: 0.00    Types: Cigarettes    Quit date: 03/22/2012    Years since quitting: 10.8   Smokeless tobacco: Never  Vaping Use   Vaping status: Never Used  Substance and Sexual Activity   Alcohol use: No    Comment: occasional   Drug use: No   Sexual activity: Not Currently    Birth control/protection: Surgical  Other Topics Concern   Not on file  Social History Narrative   Not on file   Social Drivers of Health   Financial Resource Strain: Not on file  Food Insecurity: Not on file  Transportation Needs: Not on file  Physical Activity: Not on file  Stress: Not on file  Social Connections: Not on file  Intimate Partner Violence: Not on file    Review of Systems   General: Negative for anorexia, weight loss, fever, chills, fatigue, weakness. Eyes: Negative for vision changes.  ENT: Negative for hoarseness, difficulty swallowing , nasal  congestion. CV: Negative for chest pain, angina, palpitations, dyspnea on exertion, peripheral edema.  Respiratory: Negative for dyspnea at rest, dyspnea on exertion, cough, sputum, wheezing.  GI: See history of present illness. GU:  Negative for dysuria, hematuria, urinary incontinence, urinary frequency, nocturnal urination.  MS: Negative for joint pain, low back pain.  Derm: Negative for rash or itching.  Neuro: Negative for weakness, abnormal sensation, seizure, frequent headaches,  memory loss,  confusion.  Psych: Negative for anxiety, depression, suicidal ideation, hallucinations.  Endo: Negative for unusual weight change.  Heme: Negative for bruising or bleeding. Allergy: Negative for rash or hives.  Physical Exam   LMP 02/14/2014    General: Well-nourished, well-developed in no acute distress.  Head: Normocephalic, atraumatic.   Eyes: Conjunctiva pink, no icterus. Mouth: Oropharyngeal mucosa moist and pink , no lesions erythema or exudate. Neck: Supple without thyromegaly, masses, or lymphadenopathy.  Lungs: Clear to auscultation bilaterally.  Heart: Regular rate and rhythm, no murmurs rubs or gallops.  Abdomen: Bowel sounds are normal, nontender, nondistended, no hepatosplenomegaly or masses,  no abdominal bruits or hernia, no rebound or guarding.   Rectal: *** Extremities: No lower extremity edema. No clubbing or deformities.  Neuro: Alert and oriented x 4 , grossly normal neurologically.  Skin: Warm and dry, no rash or jaundice.   Psych: Alert and cooperative, normal mood and affect.  Labs   *** Imaging Studies   CT Head Wo Contrast Result Date: 01/26/2023 CLINICAL DATA:  Head trauma abnormal mental status headache EXAM: CT HEAD WITHOUT CONTRAST TECHNIQUE: Contiguous axial images were obtained from the base of the skull through the vertex without intravenous contrast. RADIATION DOSE REDUCTION: This exam was performed according to the departmental dose-optimization  program which includes automated exposure control, adjustment of the mA and/or kV according to patient size and/or use of iterative reconstruction technique. COMPARISON:  None Available. FINDINGS: Brain: No evidence of acute infarction, hemorrhage, hydrocephalus, extra-axial collection or mass lesion/mass effect. Vascular: No hyperdense vessel or unexpected calcification. Skull: Normal. Negative for fracture or focal lesion. Sinuses/Orbits: No acute finding. Other: None IMPRESSION: Negative non contrasted CT appearance of the brain. Electronically Signed   By: Jasmine Pang M.D.   On: 01/26/2023 20:51   DG Pelvis 1-2 Views Result Date: 01/26/2023 CLINICAL DATA:  Fall with sacral pain EXAM: PELVIS - 1-2 VIEW COMPARISON:  None Available. FINDINGS: There is no evidence of pelvic fracture or diastasis. No pelvic bone lesions are seen. IMPRESSION: Negative. Electronically Signed   By: Jasmine Pang M.D.   On: 01/26/2023 20:49    Assessment       PLAN   ***   Leanna Battles. Melvyn Neth, MHS, PA-C [facility]

## 2023-04-24 ENCOUNTER — Ambulatory Visit: Payer: Self-pay

## 2023-05-01 ENCOUNTER — Telehealth: Payer: Self-pay | Admitting: Physical Therapy

## 2023-05-01 ENCOUNTER — Ambulatory Visit: Payer: Self-pay | Admitting: Physical Therapy

## 2023-05-01 NOTE — Telephone Encounter (Signed)
 Left pt a VM re missed PT eval todat

## 2023-05-01 NOTE — Therapy (Deleted)
 OUTPATIENT PHYSICAL THERAPY FEMALE PELVIC EVALUATION   Patient Name: Robin Martin MRN: [id number] DOB:1980-07-19, 43 y.o., female Today's Date: 05/01/2023  END OF SESSION:   Past Medical History:  Diagnosis Date   Abnormal Pap smear    Bipolar 1 disorder (HCC)    Cervical cancer (HCC) 2005   LEEP-cervical cancer   DDD (degenerative disc disease), lumbar    Essential hypertension    GERD (gastroesophageal reflux disease)    Gestational diabetes mellitus, antepartum    Heartburn in pregnancy    Herpes    Nephrolithiasis    Ovarian cyst    age 60   Postpartum depression    Sciatic pain    Scoliosis    Tendonitis    Vision problems    Past Surgical History:  Procedure Laterality Date   CESAREAN SECTION     x2--2005 and 2014   CESAREAN SECTION WITH BILATERAL TUBAL LIGATION Bilateral 04/09/2012   BTL not done   CESAREAN SECTION WITH BILATERAL TUBAL LIGATION Bilateral 11/06/2014   Procedure: REPEAT CESAREAN SECTION WITH BILATERAL TUBAL LIGATION REMOVAL OF SKIN TAG;  Surgeon: Jaymes Graff, MD;  Location: [facility];  Service: Obstetrics;  Laterality: Bilateral;   CHOLECYSTECTOMY N/A 05/28/2019   Procedure: LAPAROSCOPIC CHOLECYSTECTOMY WITH INTRAOPERATIVE CHOLANGIOGRAM;  Surgeon: Luretha Murphy, MD;  Location: [facility];  Service: General;  Laterality: N/A;   CYSTOSCOPY N/A 04/24/2015   Procedure: CYSTOSCOPY;  Surgeon: Osborn Coho, MD;  Location: [facility];  Service: Gynecology;  Laterality: N/A;   LAPAROSCOPIC HYSTERECTOMY Bilateral 04/24/2015   Procedure: HYSTERECTOMY TOTAL LAPAROSCOPIC bilateral salpingectomy ;  Surgeon: Osborn Coho, MD;  Location: [facility];  Service: Gynecology;  Laterality: Bilateral;   LEEP     TYMPANOSTOMY TUBE PLACEMENT     Patient Active Problem List   Diagnosis Date Noted   Granulation tissue at vaginal vault 12/14/2015   Dysfunctional uterine bleeding 04/24/2015   Polyhydramnios in third trimester--90%ile, 22.78 11/06/14 11/06/2014   S/P cesarean  section 11/06/2014   BMI 50.0-59.9, adult (HCC) 11/04/2014   Positive GBS test 11/04/2014   H/O postpartum depression, currently pregnant 11/04/2014   Request for sterilization--consent signed 08/26/14 11/04/2014   Hypertension--sporadic, related to weight 11/04/2014   Previous cesarean section x 2 09/13/2014   Bipolar 1 disorder (HCC) 09/13/2014   Latex allergy 09/13/2014    Hx LGA (large for gestational age) fetus x 2 (9+) 09/13/2014   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  Rationale for Evaluation and Treatment: Rehabilitation  ONSET DATE: ***  SUBJECTIVE:  SUBJECTIVE STATEMENT: *** Fluid intake:   PAIN:  Are you having pain? {yes/no:20286} NPRS scale: ***/10 Pain location: {pelvic pain location:27098}  Pain type: {type:313116} Pain description: {PAIN DESCRIPTION:21022940}   Aggravating factors: *** Relieving factors: ***  PRECAUTIONS: {Therapy precautions:24002}  RED FLAGS: {PT Red Flags:29287}   WEIGHT BEARING RESTRICTIONS: {Yes ***/No:24003}  FALLS:  Has patient fallen in last 6 months? {fallsyesno:27318}  OCCUPATION: ***  ACTIVITY LEVEL : ***  PLOF: {PLOF:24004}  PATIENT GOALS: ***  PERTINENT HISTORY:  *** Sexual abuse: {Yes/No:304960894}  BOWEL  MOVEMENT: Pain with bowel movement: {yes/no:20286} Type of bowel movement:{PT BM type:27100} Fully empty rectum: {No/Yes:304960894} Leakage: {Yes/No:304960894} Pads: {Yes/No:304960894} Fiber supplement/laxative {YES/NO AS:20300}  URINATION: Pain with urination: {yes/no:20286} Fully empty bladder: {Yes/No:[id number]}*** Stream: {PT urination:27102} Urgency: {YES/NO AS:20300} Frequency: *** Leakage: {PT leakage:27103} Pads: {Yes/No:304960894}  INTERCOURSE:  Ability to have vaginal penetration {YES/NO:21197} Pain with intercourse: {pain with intercourse PA:27099} Dryness{YES/NO AS:20300} Climax: *** Marinoff Scale: ***/3 Laxative:  PREGNANCY: Vaginal deliveries *** Tearing {Yes***/No:304960894} Episiotomy {YES/NO AS:20300} C-section deliveries *** Currently pregnant {Yes***/No:[id number]}  PROLAPSE: {PT prolapse:27101}   OBJECTIVE:  Note: Objective measures were completed at Evaluation unless otherwise noted.  DIAGNOSTIC FINDINGS:  ***  PATIENT SURVEYS:  {rehab surveys:24030}  PFIQ-7: ***  COGNITION: Overall cognitive status: {cognition:24006}     SENSATION: Light touch: {intact/deficits:24005}  LUMBAR SPECIAL TESTS:  {lumbar special test:25242}  FUNCTIONAL TESTS:  {Functional tests:24029}  GAIT: Assistive device utilized: {Assistive devices:23999} Comments: ***  POSTURE: {posture:25561}   LUMBARAROM/PROM:  A/PROM A/PROM  eval  Flexion   Extension   Right lateral flexion   Left lateral flexion   Right rotation   Left rotation    (Blank rows = not tested)  LOWER EXTREMITY ROM:  {AROM/PROM:27142} ROM Right eval Left eval  Hip flexion    Hip extension    Hip abduction    Hip adduction    Hip internal rotation    Hip external rotation    Knee flexion    Knee extension    Ankle dorsiflexion    Ankle plantarflexion    Ankle inversion    Ankle eversion     (Blank rows = not tested)  LOWER EXTREMITY MMT:  MMT Right eval Left eval   Hip flexion    Hip extension    Hip abduction    Hip adduction    Hip internal rotation    Hip external rotation    Knee flexion    Knee extension    Ankle dorsiflexion    Ankle plantarflexion    Ankle inversion    Ankle eversion     (Blank rows = not tested) PALPATION:   General: ***  Pelvic Alignment: ***  Abdominal: ***                External Perineal Exam: ***                             Internal Pelvic Floor: ***  Patient confirms identification and approves PT to assess internal pelvic floor and treatment {yes/no:20286}  PELVIC MMT:   MMT eval  Vaginal   Internal Anal Sphincter   External Anal Sphincter   Puborectalis   Diastasis Recti   (Blank rows = not tested)        TONE: ***  PROLAPSE: ***  TODAY'S TREATMENT:  DATE: ***  EVAL ***   PATIENT EDUCATION:  Education details: *** Person educated: {Person educated:25204} Education method: {Education Method:25205} Education comprehension: {Education Comprehension:25206}  HOME EXERCISE PROGRAM: ***  ASSESSMENT:  CLINICAL IMPRESSION: Patient is a *** y.o. *** who was seen today for physical therapy evaluation and treatment for ***.   OBJECTIVE IMPAIRMENTS: {opptimpairments:25111}.   ACTIVITY LIMITATIONS: {activitylimitations:27494}  PARTICIPATION LIMITATIONS: {participationrestrictions:25113}  PERSONAL FACTORS: {Personal factors:25162} are also affecting patient's functional outcome.   REHAB POTENTIAL: {rehabpotential:25112}  CLINICAL DECISION MAKING: {clinical decision making:25114}  EVALUATION COMPLEXITY: {Evaluation complexity:25115}   GOALS: Goals reviewed with patient? {yes/no:20286}  SHORT TERM GOALS: Target date: ***  *** Baseline: Goal status: INITIAL  2.  *** Baseline:  Goal status: INITIAL  3.  *** Baseline:  Goal status: INITIAL  4.   *** Baseline:  Goal status: INITIAL  5.  *** Baseline:  Goal status: INITIAL  6.  *** Baseline:  Goal status: INITIAL  LONG TERM GOALS: Target date: ***  *** Baseline:  Goal status: INITIAL  2.  *** Baseline:  Goal status: INITIAL  3.  *** Baseline:  Goal status: INITIAL  4.  *** Baseline:  Goal status: INITIAL  5.  *** Baseline:  Goal status: INITIAL  6.  *** Baseline:  Goal status: INITIAL  PLAN:  PT FREQUENCY: {rehab frequency:25116}  PT DURATION: {rehab duration:25117}  PLANNED INTERVENTIONS: {rehab planned interventions:25118::"97110-Therapeutic exercises","97530- Therapeutic [phone]- Neuromuscular re-education","97535- Self JXBJ","47829- Manual therapy"}  PLAN FOR NEXT SESSION: ***   Ninoska Goswick, PT 05/01/2023, 10:12 AM

## 2023-09-06 ENCOUNTER — Encounter (INDEPENDENT_AMBULATORY_CARE_PROVIDER_SITE_OTHER): Payer: Self-pay | Admitting: Otolaryngology

## 2023-09-06 ENCOUNTER — Ambulatory Visit (INDEPENDENT_AMBULATORY_CARE_PROVIDER_SITE_OTHER): Admitting: Otolaryngology

## 2023-09-06 VITALS — BP 124/79 | HR 88

## 2023-09-06 DIAGNOSIS — Z8669 Personal history of other diseases of the nervous system and sense organs: Secondary | ICD-10-CM

## 2023-09-06 DIAGNOSIS — H900 Conductive hearing loss, bilateral: Secondary | ICD-10-CM

## 2023-09-06 DIAGNOSIS — Z9629 Presence of other otological and audiological implants: Secondary | ICD-10-CM | POA: Diagnosis not present

## 2023-09-06 DIAGNOSIS — H7201 Central perforation of tympanic membrane, right ear: Secondary | ICD-10-CM

## 2023-09-06 DIAGNOSIS — Z09 Encounter for follow-up examination after completed treatment for conditions other than malignant neoplasm: Secondary | ICD-10-CM

## 2023-09-06 MED ORDER — CIPROFLOXACIN-DEXAMETHASONE 0.3-0.1 % OT SUSP: 4.0000 [drp] | Freq: Two times a day (BID) | OTIC | 8 refills | Status: AC

## 2023-09-09 DIAGNOSIS — H7201 Central perforation of tympanic membrane, right ear: Secondary | ICD-10-CM | POA: Insufficient documentation

## 2023-09-09 DIAGNOSIS — H900 Conductive hearing loss, bilateral: Secondary | ICD-10-CM | POA: Insufficient documentation

## 2023-09-09 NOTE — Progress Notes (Signed)
 Patient ID: Robin Martin, female   DOB: September 29, 1980, 43 y.o.   MRN: [id number]  Follow-up: Recurrent ear infections, hearing loss  HPI: The patient is a 43 year old female who returns today for her follow-up evaluation.  She was last seen 1 year ago.  At that time, she was complaining of recurrent ear infections.  She previously underwent bilateral myringotomy and tube placement in 2019 to treat her chronic middle ear effusions.  At her last visit, the right tube was in place and patent.  The left tympanic membrane was intact, with significant TM scarring and tympanosclerosis.  Bilateral mild conductive hearing loss was noted.  The patient returns today complaining of 3 episodes of left otitis media over the past month.  She was treated with oral antibiotics.  Currently she denies any otalgia, otorrhea, or vertigo.  She denies any recent change in her hearing.  Exam: General: Communicates without difficulty, well nourished, no acute distress. Head: Normocephalic, no evidence injury, no tenderness, facial buttresses intact without stepoff. Face/sinus: No tenderness to palpation and percussion. Facial movement is normal and symmetric. Eyes: PERRL, EOMI. No scleral icterus, conjunctivae clear. Neuro: CN II exam reveals vision grossly intact.  No nystagmus at any point of gaze. Ears: Auricles well formed without lesions.  Ear canals are intact without mass or lesion.  No erythema or edema is appreciated.  The left tympanic membrane is intact.  The right ventilating tube is in place, with a small polyp at the base of the tube.  Nose: External evaluation reveals normal support and skin without lesions.  Dorsum is intact.  Anterior rhinoscopy reveals congested mucosa over anterior aspect of inferior turbinates and intact septum.  No purulence noted. Oral:  Oral cavity and oropharynx are intact, symmetric, without erythema or edema.  Mucosa is moist without lesions. Neck: Full range of motion without pain.  There  is no significant lymphadenopathy.  No masses palpable.  Thyroid bed within normal limits to palpation.  Parotid glands and submandibular glands equal bilaterally without mass.  Trachea is midline. Neuro:  CN 2-12 grossly intact.   Assessment: 1.  The right ventilating tube is in place and patent.  A small polyp is noted at the base of the tube. 2.  The left tympanic membrane is intact.  No acute infection is noted. 3.  Subjectively stable bilateral mild conductive hearing loss.  Plan: 1.  The physical exam findings are reviewed with the patient. 2.  Ciprodex  eardrops 4 drops right ear twice daily for 1 week. 3.  The patient will return for reevaluation in 6 months.

## 2023-09-19 DIAGNOSIS — Z8616 Personal history of COVID-19: Secondary | ICD-10-CM

## 2023-09-19 HISTORY — DX: Personal history of COVID-19: Z86.16

## 2023-09-26 ENCOUNTER — Encounter (HOSPITAL_COMMUNITY): Payer: Self-pay

## 2023-09-26 ENCOUNTER — Emergency Department (HOSPITAL_COMMUNITY): Admission: EM | Admit: 2023-09-26 | Discharge: 2023-09-26 | Disposition: A | Discharge status: Home / Self Care

## 2023-09-26 ENCOUNTER — Other Ambulatory Visit: Payer: Self-pay

## 2023-09-26 ENCOUNTER — Emergency Department (HOSPITAL_COMMUNITY)

## 2023-09-26 DIAGNOSIS — R109 Unspecified abdominal pain: Secondary | ICD-10-CM

## 2023-09-26 DIAGNOSIS — M545 Low back pain, unspecified: Secondary | ICD-10-CM | POA: Insufficient documentation

## 2023-09-26 DIAGNOSIS — Z79899 Other long term (current) drug therapy: Secondary | ICD-10-CM | POA: Insufficient documentation

## 2023-09-26 DIAGNOSIS — R1032 Left lower quadrant pain: Secondary | ICD-10-CM | POA: Insufficient documentation

## 2023-09-26 DIAGNOSIS — Z91040 Latex allergy status: Secondary | ICD-10-CM | POA: Diagnosis not present

## 2023-09-26 DIAGNOSIS — R310 Gross hematuria: Secondary | ICD-10-CM | POA: Diagnosis not present

## 2023-09-26 DIAGNOSIS — I10 Essential (primary) hypertension: Secondary | ICD-10-CM | POA: Diagnosis not present

## 2023-09-26 LAB — URINALYSIS, ROUTINE W REFLEX MICROSCOPIC

## 2023-09-26 LAB — CBC WITH DIFFERENTIAL/PLATELET
Abs Immature Granulocytes: 0.04 K/uL (ref 0.00–0.07)
Basophils Absolute: 0 K/uL (ref 0.0–0.1)
Basophils Relative: 0 %
Eosinophils Absolute: 0.2 K/uL (ref 0.0–0.5)
Eosinophils Relative: 1 %
HCT: 40.9 % (ref 36.0–46.0)
Hemoglobin: 14.3 g/dL (ref 12.0–15.0)
Immature Granulocytes: 0 %
Lymphocytes Relative: 8 %
Lymphs Abs: 1.1 K/uL (ref 0.7–4.0)
MCH: 31.2 pg (ref 26.0–34.0)
MCHC: 35 g/dL (ref 30.0–36.0)
MCV: 89.1 fL (ref 80.0–100.0)
Monocytes Absolute: 0.8 K/uL (ref 0.1–1.0)
Monocytes Relative: 6 %
Neutro Abs: 11.2 K/uL — ABNORMAL HIGH (ref 1.7–7.7)
Neutrophils Relative %: 85 %
Platelets: 195 K/uL (ref 150–400)
RBC: 4.59 MIL/uL (ref 3.87–5.11)
RDW: 11.9 % (ref 11.5–15.5)
WBC: 13.2 K/uL — ABNORMAL HIGH (ref 4.0–10.5)
nRBC: 0 % (ref 0.0–0.2)

## 2023-09-26 LAB — COMPREHENSIVE METABOLIC PANEL WITH GFR
ALT: 23 U/L (ref 0–44)
AST: 18 U/L (ref 15–41)
Albumin: 3.7 g/dL (ref 3.5–5.0)
Alkaline Phosphatase: 88 U/L (ref 38–126)
Anion gap: 12 (ref 5–15)
BUN: 11 mg/dL (ref 6–20)
CO2: 26 mmol/L (ref 22–32)
Calcium: 9 mg/dL (ref 8.9–10.3)
Chloride: 98 mmol/L (ref 98–111)
Creatinine, Ser: 0.82 mg/dL (ref 0.44–1.00)
GFR, Estimated: >60 mL/min (ref >60)
Glucose, Bld: 316 mg/dL — ABNORMAL HIGH (ref 70–99)
Potassium: 4.1 mmol/L (ref 3.5–5.1)
Sodium: 136 mmol/L (ref 135–145)
Total Bilirubin: 0.9 mg/dL (ref 0.0–1.2)
Total Protein: 7.3 g/dL (ref 6.5–8.1)

## 2023-09-26 LAB — URINALYSIS, MICROSCOPIC (REFLEX)
RBC / HPF: >50 RBC/hpf (ref 0–5)
Squamous Epithelial / HPF: NONE SEEN /HPF (ref 0–5)

## 2023-09-26 LAB — LIPASE, BLOOD: Lipase: 32 U/L (ref 11–51)

## 2023-09-26 LAB — CBG MONITORING, ED: Glucose-Capillary: 232 mg/dL — ABNORMAL HIGH (ref 70–99)

## 2023-09-26 MED ORDER — ONDANSETRON HCL 4 MG PO TABS: 4.0000 mg | ORAL_TABLET | Freq: Four times a day (QID) | ORAL | 0 refills | Status: AC

## 2023-09-26 MED ORDER — IOHEXOL 350 MG/ML SOLN
125.0000 mL | Freq: Once | INTRAVENOUS | Status: AC | PRN
  Administered 2023-09-26: 125 mL via INTRAVENOUS

## 2023-09-26 MED ORDER — OXYCODONE-ACETAMINOPHEN 5-325 MG PO TABS: 1.0000 | ORAL_TABLET | ORAL | 0 refills | Status: DC | PRN

## 2023-09-26 MED ORDER — HYDROMORPHONE HCL 1 MG/ML IJ SOLN
1.0000 mg | Freq: Once | INTRAMUSCULAR | Status: AC
  Administered 2023-09-26: 1 mg via INTRAVENOUS
  Filled 2023-09-26: qty 1

## 2023-09-26 MED ORDER — HYDROMORPHONE HCL 1 MG/ML IJ SOLN
0.5000 mg | Freq: Once | INTRAMUSCULAR | Status: AC
  Administered 2023-09-26: 0.5 mg via INTRAVENOUS
  Filled 2023-09-26: qty 0.5

## 2023-09-26 MED ORDER — ONDANSETRON HCL 4 MG/2ML IJ SOLN
4.0000 mg | Freq: Once | INTRAMUSCULAR | Status: AC
  Administered 2023-09-26: 4 mg via INTRAVENOUS
  Filled 2023-09-26: qty 2

## 2023-09-26 NOTE — ED Notes (Signed)
 Patient transported to CT

## 2023-09-26 NOTE — ED Triage Notes (Signed)
 Pt arrived via POV c/o excruciating left lower back pain that radiates to her hip and leg. Pt also reports hematuria. Pt does report Hx of kidney stones. Pt denies injury.

## 2023-09-26 NOTE — Discharge Instructions (Signed)
 Someone from Dr. Hosea office will likely be contacting you to arrange appointment for surgery.  You have been prescribed medication for nausea and for pain.  This medication can cause drowsiness so do not operate machinery or drive while taking the medicine.  You may also need to take a stool softener as pain medication can cause constipation.  Return to the emergency department if you develop any new or worsening symptoms.

## 2023-09-26 NOTE — Consult Note (Signed)
 I have been asked to see the patient by Dr. Lavonia Pat, for evaluation and management of gross hematuria/left flank pain.  History of present illness: 43 year old female presented to the [facility] emergency department with acute onset left-sided flank and back pain and associated hematuria.  The patient denies any significant voiding symptoms.  Her pain was radiating down from her left flank into her bladder/urethra.  No documented fevers or chills.  CT scan was performed in the emergency department, initially stone protocol to rule out kidney stones.  This demonstrated some mild hydronephrosis with a filling defect concerning for malignancy.  Subsequently had a contrast enhanced study that demonstrated similar filling defect with no other abnormalities.  There was contrast that went all the way down into the bladder denoting an patent ureter and there was no significant hydroureteronephrosis.  Review of systems: A 12 point comprehensive review of systems was obtained and is negative unless otherwise stated in the history of present illness.  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 09/09/2023   Conductive hearing loss, bilateral 09/09/2023   Granulation tissue at vaginal vault 12/14/2015   Dysfunctional uterine bleeding 04/24/2015   Polyhydramnios in third trimester--90%ile, 22.78 11/06/14 11/06/2014   S/P cesarean section 11/06/2014   BMI 50.0-59.9, adult (HCC) 11/04/2014   Positive GBS test 11/04/2014   H/O postpartum depression, currently pregnant 11/04/2014   Request for sterilization--consent signed 08/26/14 11/04/2014   Hypertension--sporadic, related to weight 11/04/2014   Previous cesarean section x 2 09/13/2014   Bipolar 1 disorder (HCC) 09/13/2014   Latex allergy 09/13/2014    Hx LGA (large for gestational age) fetus x 2 (9+) 09/13/2014   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   Current Outpatient Medications on File Prior to Encounter  Medication Sig Dispense Refill   ACCU-CHEK FASTCLIX LANCETS MISC 1 each by Does not apply route 4 (four) times daily. 150 each 5   atorvastatin (LIPITOR) 10 MG tablet Take 10 mg by mouth daily.     busPIRone (BUSPAR) 5 MG tablet Take 5 mg by mouth 2 (two) times daily.     CRANBERRY PO Take 1 tablet by mouth daily.     docusate sodium  (COLACE) 100 MG capsule Take 2 capsules (200 mg total) by mouth every 12 (twelve) hours. 20 capsule 0   glimepiride (AMARYL) 4 MG tablet Take 2-4 mg by mouth See admin instructions. Take 1 tablet (4 mg) by mouth in the morning & take 0.5 tablet (2 mg) by mouth in the evening.     glucose blood (ACCU-CHEK GUIDE) test strip Use as instructed bid. E11.65 150 each 5   HYDROcodone -acetaminophen  (NORCO/VICODIN) 5-325 MG tablet Take 1 tablet by mouth every 4 (four) hours as needed. 10 tablet 0   hydrOXYzine (VISTARIL) 50 MG capsule Take 100 mg by mouth at bedtime.     ibuprofen  (ADVIL ) 800 MG tablet Take 1 tablet (800 mg total) by mouth every 6 (six) hours as needed for moderate pain (pain score 4-6). 20 tablet 0   lisinopril-hydrochlorothiazide  (ZESTORETIC) 20-25 MG tablet Take 1 tablet by mouth daily.  metFORMIN (GLUCOPHAGE) 1000 MG tablet Take 1,000 mg by mouth 2 (two) times daily.     Multiple Vitamin (MULTIVITAMIN WITH MINERALS) TABS tablet Take 1 tablet by mouth daily.     omeprazole (PRILOSEC) 20 MG capsule Take 20 mg by mouth daily.     ondansetron  (ZOFRAN ) 4 MG tablet Take 4 mg by mouth every 4 (four) hours as needed for nausea/vomiting.     promethazine  (PHENERGAN ) 25 MG tablet  Take 25 mg by mouth every 8 (eight) hours as needed for nausea/vomiting.     venlafaxine XR (EFFEXOR-XR) 37.5 MG 24 hr capsule Take 37.5 mg by mouth daily.      Past Medical History:  Diagnosis Date   Abnormal Pap smear    Bipolar 1 disorder (HCC)    Cervical cancer (HCC) 2005   LEEP-cervical cancer   DDD (degenerative disc disease), lumbar    Essential hypertension    GERD (gastroesophageal reflux disease)    Gestational diabetes mellitus, antepartum    Heartburn in pregnancy    Herpes    Nephrolithiasis    Ovarian cyst    age 68   Postpartum depression    Sciatic pain    Scoliosis    Tendonitis    Vision problems     Past Surgical History:  Procedure Laterality Date   CESAREAN SECTION     x2--2005 and 2014   CESAREAN SECTION WITH BILATERAL TUBAL LIGATION Bilateral 04/09/2012   BTL not done   CESAREAN SECTION WITH BILATERAL TUBAL LIGATION Bilateral 11/06/2014   Procedure: REPEAT CESAREAN SECTION WITH BILATERAL TUBAL LIGATION REMOVAL OF SKIN TAG;  Surgeon: Ovid All, MD;  Location: [facility];  Service: Obstetrics;  Laterality: Bilateral;   CHOLECYSTECTOMY N/A 05/28/2019   Procedure: LAPAROSCOPIC CHOLECYSTECTOMY WITH INTRAOPERATIVE CHOLANGIOGRAM;  Surgeon: Gladis Cough, MD;  Location: [facility];  Service: General;  Laterality: N/A;   CYSTOSCOPY N/A 04/24/2015   Procedure: CYSTOSCOPY;  Surgeon: Jon Rummer, MD;  Location: [facility];  Service: Gynecology;  Laterality: N/A;   LAPAROSCOPIC HYSTERECTOMY Bilateral 04/24/2015   Procedure: HYSTERECTOMY TOTAL LAPAROSCOPIC bilateral salpingectomy ;  Surgeon: Jon Rummer, MD;  Location: [facility];  Service: Gynecology;  Laterality: Bilateral;   LEEP     TYMPANOSTOMY TUBE PLACEMENT      Social History   Tobacco Use   Smoking status: Former    Current packs/day: 0.00    Types: Cigarettes    Quit date: 03/22/2012    Years since quitting: 11.5   Smokeless tobacco: Never  Vaping Use   Vaping status: Never Used  Substance Use Topics    Alcohol use: No    Comment: occasional   Drug use: No    Family History  Problem Relation Age of Onset   Diabetes Mother    Hypertension Mother    Mental illness Mother    Diabetes Father    Hypertension Father    Heart disease Father    Heart attack Father    Hyperlipidemia Father    Other Father        heart surgery   Asthma Sister    Diabetes Sister    Diabetes Maternal Grandmother    Hypertension Maternal Grandmother    Stroke Maternal Grandmother    Hypertension Paternal Grandmother    Hypertension Maternal Grandfather    Heart attack Maternal Grandfather    Other Maternal Grandfather        heart surgery    PE: Vitals:   09/26/23 2000 09/26/23 2100 09/26/23 2115 09/26/23 2130  BP: 115/71 129/79  101/80  Pulse: (!) 101 93 (!) 103 (!) 107  Resp:    18  Temp:      TempSrc:      SpO2: 90% 92% 96% 95%  Weight:      Height:       Patient appears to be in no acute distress  patient is alert and oriented x3 Atraumatic normocephalic head No cervical or supraclavicular lymphadenopathy appreciated No increased work of breathing, no audible wheezes/rhonchi Regular sinus rhythm/rate Abdomen is soft, nontender, nondistended, no CVA or suprapubic tenderness Lower extremities are symmetric without appreciable edema Grossly neurologically intact No identifiable skin lesions  Recent Labs    09/26/23 1756  WBC 13.2*  HGB 14.3  HCT 40.9   Recent Labs    09/26/23 1756  NA 136  K 4.1  CL 98  CO2 26  GLUCOSE 316*  BUN 11  CREATININE 0.82  CALCIUM 9.0   No results for input(s): LABPT, INR in the last 72 hours. No results for input(s): LABURIN in the last 72 hours. Results for orders placed or performed during the hospital encounter of 05/24/19  SARS CORONAVIRUS 2 (TAT 6-24 HRS) Nasopharyngeal Nasopharyngeal Swab     Status: None   Collection Time: 05/24/19  3:00 PM   Specimen: Nasopharyngeal Swab  Result Value Ref Range Status   SARS Coronavirus 2  NEGATIVE NEGATIVE Final    Comment: (NOTE) SARS-CoV-2 target nucleic acids are NOT DETECTED. The SARS-CoV-2 RNA is generally detectable in upper and lower respiratory specimens during the acute phase of infection. Negative results do not preclude SARS-CoV-2 infection, do not rule out co-infections with other pathogens, and should not be used as the sole basis for treatment or other patient management decisions. Negative results must be combined with clinical observations, patient history, and epidemiological information. The expected result is Negative. Fact Sheet for Patients: HairSlick.no Fact Sheet for Healthcare Providers: quierodirigir.com This test is not yet approved or cleared by the [place]  FDA and  has been authorized for detection and/or diagnosis of SARS-CoV-2 by FDA under an Emergency Use Authorization (EUA). This EUA will remain  in effect (meaning this test can be used) for the duration of the COVID-19 declaration under Section 56 4(b)(1) of the Act, 21 U.S.C. section 360bbb-3(b)(1), unless the authorization is terminated or revoked sooner. Performed at [facility], [address] [place], [place] [address]     Imaging: I independently reviewed the images from her CAT scan with the findings as noted in the HPI.  Imp: Gross hematuria secondary to abnormality in the left upper tract.  This is otherwise poorly characterized that needs further evaluation under anesthesia.  Recommendations: Recommended left diagnostic ureteroscopy with potential biopsy and stent placement.  Discussed this surgery with the patient and detail.  We discussed the fact that following the surgery she would likely have a stent.  We discussed the risk and associated benefits of the surgery.  Having gone through all of it, she would like to proceed in a timely fashion.  Will try to get her scheduled first available and at her  convenience.  Morene LELON Salines

## 2023-09-26 NOTE — ED Notes (Signed)
 Urologist at bedside.

## 2023-09-26 NOTE — H&P (View-Only) (Signed)
 I have been asked to see the patient by Dr. Lavonia Pat, for evaluation and management of gross hematuria/left flank pain.  History of present illness: 43 year old female presented to the [facility] emergency department with acute onset left-sided flank and back pain and associated hematuria.  The patient denies any significant voiding symptoms.  Her pain was radiating down from her left flank into her bladder/urethra.  No documented fevers or chills.  CT scan was performed in the emergency department, initially stone protocol to rule out kidney stones.  This demonstrated some mild hydronephrosis with a filling defect concerning for malignancy.  Subsequently had a contrast enhanced study that demonstrated similar filling defect with no other abnormalities.  There was contrast that went all the way down into the bladder denoting an patent ureter and there was no significant hydroureteronephrosis.  Review of systems: A 12 point comprehensive review of systems was obtained and is negative unless otherwise stated in the history of present illness.  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 09/09/2023   Conductive hearing loss, bilateral 09/09/2023   Granulation tissue at vaginal vault 12/14/2015   Dysfunctional uterine bleeding 04/24/2015   Polyhydramnios in third trimester--90%ile, 22.78 11/06/14 11/06/2014   S/P cesarean section 11/06/2014   BMI 50.0-59.9, adult (HCC) 11/04/2014   Positive GBS test 11/04/2014   H/O postpartum depression, currently pregnant 11/04/2014   Request for sterilization--consent signed 08/26/14 11/04/2014   Hypertension--sporadic, related to weight 11/04/2014   Previous cesarean section x 2 09/13/2014   Bipolar 1 disorder (HCC) 09/13/2014   Latex allergy 09/13/2014    Hx LGA (large for gestational age) fetus x 2 (9+) 09/13/2014   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   Current Outpatient Medications on File Prior to Encounter  Medication Sig Dispense Refill   ACCU-CHEK FASTCLIX LANCETS MISC 1 each by Does not apply route 4 (four) times daily. 150 each 5   atorvastatin (LIPITOR) 10 MG tablet Take 10 mg by mouth daily.     busPIRone (BUSPAR) 5 MG tablet Take 5 mg by mouth 2 (two) times daily.     CRANBERRY PO Take 1 tablet by mouth daily.     docusate sodium  (COLACE) 100 MG capsule Take 2 capsules (200 mg total) by mouth every 12 (twelve) hours. 20 capsule 0   glimepiride (AMARYL) 4 MG tablet Take 2-4 mg by mouth See admin instructions. Take 1 tablet (4 mg) by mouth in the morning & take 0.5 tablet (2 mg) by mouth in the evening.     glucose blood (ACCU-CHEK GUIDE) test strip Use as instructed bid. E11.65 150 each 5   HYDROcodone -acetaminophen  (NORCO/VICODIN) 5-325 MG tablet Take 1 tablet by mouth every 4 (four) hours as needed. 10 tablet 0   hydrOXYzine (VISTARIL) 50 MG capsule Take 100 mg by mouth at bedtime.     ibuprofen  (ADVIL ) 800 MG tablet Take 1 tablet (800 mg total) by mouth every 6 (six) hours as needed for moderate pain (pain score 4-6). 20 tablet 0   lisinopril-hydrochlorothiazide  (ZESTORETIC) 20-25 MG tablet Take 1 tablet by mouth daily.  metFORMIN (GLUCOPHAGE) 1000 MG tablet Take 1,000 mg by mouth 2 (two) times daily.     Multiple Vitamin (MULTIVITAMIN WITH MINERALS) TABS tablet Take 1 tablet by mouth daily.     omeprazole (PRILOSEC) 20 MG capsule Take 20 mg by mouth daily.     ondansetron  (ZOFRAN ) 4 MG tablet Take 4 mg by mouth every 4 (four) hours as needed for nausea/vomiting.     promethazine  (PHENERGAN ) 25 MG tablet  Take 25 mg by mouth every 8 (eight) hours as needed for nausea/vomiting.     venlafaxine XR (EFFEXOR-XR) 37.5 MG 24 hr capsule Take 37.5 mg by mouth daily.      Past Medical History:  Diagnosis Date   Abnormal Pap smear    Bipolar 1 disorder (HCC)    Cervical cancer (HCC) 2005   LEEP-cervical cancer   DDD (degenerative disc disease), lumbar    Essential hypertension    GERD (gastroesophageal reflux disease)    Gestational diabetes mellitus, antepartum    Heartburn in pregnancy    Herpes    Nephrolithiasis    Ovarian cyst    age 68   Postpartum depression    Sciatic pain    Scoliosis    Tendonitis    Vision problems     Past Surgical History:  Procedure Laterality Date   CESAREAN SECTION     x2--2005 and 2014   CESAREAN SECTION WITH BILATERAL TUBAL LIGATION Bilateral 04/09/2012   BTL not done   CESAREAN SECTION WITH BILATERAL TUBAL LIGATION Bilateral 11/06/2014   Procedure: REPEAT CESAREAN SECTION WITH BILATERAL TUBAL LIGATION REMOVAL OF SKIN TAG;  Surgeon: Ovid All, MD;  Location: [facility];  Service: Obstetrics;  Laterality: Bilateral;   CHOLECYSTECTOMY N/A 05/28/2019   Procedure: LAPAROSCOPIC CHOLECYSTECTOMY WITH INTRAOPERATIVE CHOLANGIOGRAM;  Surgeon: Gladis Cough, MD;  Location: [facility];  Service: General;  Laterality: N/A;   CYSTOSCOPY N/A 04/24/2015   Procedure: CYSTOSCOPY;  Surgeon: Jon Rummer, MD;  Location: [facility];  Service: Gynecology;  Laterality: N/A;   LAPAROSCOPIC HYSTERECTOMY Bilateral 04/24/2015   Procedure: HYSTERECTOMY TOTAL LAPAROSCOPIC bilateral salpingectomy ;  Surgeon: Jon Rummer, MD;  Location: [facility];  Service: Gynecology;  Laterality: Bilateral;   LEEP     TYMPANOSTOMY TUBE PLACEMENT      Social History   Tobacco Use   Smoking status: Former    Current packs/day: 0.00    Types: Cigarettes    Quit date: 03/22/2012    Years since quitting: 11.5   Smokeless tobacco: Never  Vaping Use   Vaping status: Never Used  Substance Use Topics    Alcohol use: No    Comment: occasional   Drug use: No    Family History  Problem Relation Age of Onset   Diabetes Mother    Hypertension Mother    Mental illness Mother    Diabetes Father    Hypertension Father    Heart disease Father    Heart attack Father    Hyperlipidemia Father    Other Father        heart surgery   Asthma Sister    Diabetes Sister    Diabetes Maternal Grandmother    Hypertension Maternal Grandmother    Stroke Maternal Grandmother    Hypertension Paternal Grandmother    Hypertension Maternal Grandfather    Heart attack Maternal Grandfather    Other Maternal Grandfather        heart surgery    PE: Vitals:   09/26/23 2000 09/26/23 2100 09/26/23 2115 09/26/23 2130  BP: 115/71 129/79  101/80  Pulse: (!) 101 93 (!) 103 (!) 107  Resp:    18  Temp:      TempSrc:      SpO2: 90% 92% 96% 95%  Weight:      Height:       Patient appears to be in no acute distress  patient is alert and oriented x3 Atraumatic normocephalic head No cervical or supraclavicular lymphadenopathy appreciated No increased work of breathing, no audible wheezes/rhonchi Regular sinus rhythm/rate Abdomen is soft, nontender, nondistended, no CVA or suprapubic tenderness Lower extremities are symmetric without appreciable edema Grossly neurologically intact No identifiable skin lesions  Recent Labs    09/26/23 1756  WBC 13.2*  HGB 14.3  HCT 40.9   Recent Labs    09/26/23 1756  NA 136  K 4.1  CL 98  CO2 26  GLUCOSE 316*  BUN 11  CREATININE 0.82  CALCIUM 9.0   No results for input(s): LABPT, INR in the last 72 hours. No results for input(s): LABURIN in the last 72 hours. Results for orders placed or performed during the hospital encounter of 05/24/19  SARS CORONAVIRUS 2 (TAT 6-24 HRS) Nasopharyngeal Nasopharyngeal Swab     Status: None   Collection Time: 05/24/19  3:00 PM   Specimen: Nasopharyngeal Swab  Result Value Ref Range Status   SARS Coronavirus 2  NEGATIVE NEGATIVE Final    Comment: (NOTE) SARS-CoV-2 target nucleic acids are NOT DETECTED. The SARS-CoV-2 RNA is generally detectable in upper and lower respiratory specimens during the acute phase of infection. Negative results do not preclude SARS-CoV-2 infection, do not rule out co-infections with other pathogens, and should not be used as the sole basis for treatment or other patient management decisions. Negative results must be combined with clinical observations, patient history, and epidemiological information. The expected result is Negative. Fact Sheet for Patients: HairSlick.no Fact Sheet for Healthcare Providers: quierodirigir.com This test is not yet approved or cleared by the [place]  FDA and  has been authorized for detection and/or diagnosis of SARS-CoV-2 by FDA under an Emergency Use Authorization (EUA). This EUA will remain  in effect (meaning this test can be used) for the duration of the COVID-19 declaration under Section 56 4(b)(1) of the Act, 21 U.S.C. section 360bbb-3(b)(1), unless the authorization is terminated or revoked sooner. Performed at [facility], [address] [place], [place] [address]     Imaging: I independently reviewed the images from her CAT scan with the findings as noted in the HPI.  Imp: Gross hematuria secondary to abnormality in the left upper tract.  This is otherwise poorly characterized that needs further evaluation under anesthesia.  Recommendations: Recommended left diagnostic ureteroscopy with potential biopsy and stent placement.  Discussed this surgery with the patient and detail.  We discussed the fact that following the surgery she would likely have a stent.  We discussed the risk and associated benefits of the surgery.  Having gone through all of it, she would like to proceed in a timely fashion.  Will try to get her scheduled first available and at her  convenience.  Morene LELON Salines

## 2023-09-26 NOTE — ED Provider Notes (Signed)
 [place] EMERGENCY DEPARTMENT AT [facility] Provider Note   CSN: [id number] Arrival date & time: 09/26/23  1731     Patient presents with: Back Pain (With hematuria)   Robin Martin is a 43 y.o. female.    Back Pain Associated symptoms: no chest pain, no fever, no headaches and no weakness        Robin Martin is a 43 y.o. female past medical history of bipolar disorder, chronic back pain, hypertension and recurrent kidney stones who presents to the Emergency Department complaining of sudden onset left flank and left lower abdominal pain that began earlier today.  She describes pain as excruciating.  She describes radiating pain from her left flank lower back area into her left lower abdomen.  Pain has been associated with nausea vomiting and frank blood with urination.  Current symptoms feel similar to prior kidney stones.  She denies any fever or chills chest pain or shortness of breath.  No known injury.  Denies any abnormal vaginal bleeding as she has undergone hysterectomy  Prior to Admission medications   Medication Sig Start Date End Date Taking? Authorizing Provider  ACCU-CHEK FASTCLIX LANCETS MISC 1 each by Does not apply route 4 (four) times daily. 08/02/17   Nida, Gebreselassie W, MD  atorvastatin (LIPITOR) 10 MG tablet Take 10 mg by mouth daily.    [provider]  busPIRone (BUSPAR) 5 MG tablet Take 5 mg by mouth 2 (two) times daily. 03/26/19   [provider]  CRANBERRY PO Take 1 tablet by mouth daily.    [provider]  docusate sodium  (COLACE) 100 MG capsule Take 2 capsules (200 mg total) by mouth every 12 (twelve) hours. 01/27/23   Haze Lonni PARAS, MD  glimepiride (AMARYL) 4 MG tablet Take 2-4 mg by mouth See admin instructions. Take 1 tablet (4 mg) by mouth in the morning & take 0.5 tablet (2 mg) by mouth in the evening. 05/06/19   [provider]  glucose blood (ACCU-CHEK GUIDE) test strip Use as instructed  bid. E11.65 07/25/17   Nida, Gebreselassie W, MD  HYDROcodone -acetaminophen  (NORCO/VICODIN) 5-325 MG tablet Take 1 tablet by mouth every 4 (four) hours as needed. 01/27/23   Haze Lonni PARAS, MD  hydrOXYzine (VISTARIL) 50 MG capsule Take 100 mg by mouth at bedtime. 04/17/19   [provider]  ibuprofen  (ADVIL ) 800 MG tablet Take 1 tablet (800 mg total) by mouth every 6 (six) hours as needed for moderate pain (pain score 4-6). 01/27/23   Haze Lonni PARAS, MD  lisinopril-hydrochlorothiazide  (ZESTORETIC) 20-25 MG tablet Take 1 tablet by mouth daily. 04/28/19   [provider]  metFORMIN (GLUCOPHAGE) 1000 MG tablet Take 1,000 mg by mouth 2 (two) times daily. 05/06/19   [provider]  Multiple Vitamin (MULTIVITAMIN WITH MINERALS) TABS tablet Take 1 tablet by mouth daily.    [provider]  omeprazole (PRILOSEC) 20 MG capsule Take 20 mg by mouth daily.    [provider]  ondansetron  (ZOFRAN ) 4 MG tablet Take 4 mg by mouth every 4 (four) hours as needed for nausea/vomiting. 04/03/19   [provider]  promethazine  (PHENERGAN ) 25 MG tablet Take 25 mg by mouth every 8 (eight) hours as needed for nausea/vomiting. 04/03/19   [provider]  venlafaxine XR (EFFEXOR-XR) 37.5 MG 24 hr capsule Take 37.5 mg by mouth daily. 04/17/19   [provider]    Allergies: Chocolate, Latex, and Morphine  and codeine    Review  of Systems  Constitutional:  Positive for appetite change. Negative for chills and fever.  Respiratory:  Negative for shortness of breath.   Cardiovascular:  Negative for chest pain.  Gastrointestinal:  Positive for nausea and vomiting.  Genitourinary:  Positive for flank pain and hematuria. Negative for decreased urine volume, difficulty urinating, vaginal bleeding and vaginal discharge.  Musculoskeletal:  Positive for back pain.  Neurological:  Positive for dizziness. Negative for weakness and headaches.    Updated  Vital Signs BP 116/73   Pulse 88   Temp 98.9 F (37.2 C) (Oral)   Resp 18   Ht 5' 7 (1.702 m)   Wt 118 kg   LMP 02/14/2014   SpO2 91%   BMI 40.74 kg/m   Physical Exam Vitals and nursing note reviewed.  Constitutional:      Comments: Patient appears uncomfortable, holding her left lower abdomen and rocking back and forth on the stretcher  Cardiovascular:     Rate and Rhythm: Normal rate and regular rhythm.  Pulmonary:     Effort: Pulmonary effort is normal.     Breath sounds: Normal breath sounds. No stridor.  Abdominal:     Palpations: Abdomen is soft.     Tenderness: There is no right CVA tenderness or left CVA tenderness.  Musculoskeletal:        General: Tenderness present.     Comments: Some mild tenderness to palpation left lower lumbar paraspinal muscles.  Negative straight leg raise bilaterally.  No radiating symptoms to the hip or lower legs.  Skin:    General: Skin is warm.     Capillary Refill: Capillary refill takes less than 2 seconds.  Neurological:     General: No focal deficit present.     Mental Status: She is alert.     Sensory: No sensory deficit.     Motor: No weakness.     (all labs ordered are listed, but only abnormal results are displayed) Labs Reviewed  URINALYSIS, ROUTINE W REFLEX MICROSCOPIC - Abnormal; Notable for the following components:      Result Value   Color, Urine RED (*)    APPearance CLOUDY (*)    Glucose, UA   (*)    Value: TEST NOT REPORTED DUE TO COLOR INTERFERENCE OF URINE PIGMENT   Hgb urine dipstick   (*)    Value: TEST NOT REPORTED DUE TO COLOR INTERFERENCE OF URINE PIGMENT   Bilirubin Urine   (*)    Value: TEST NOT REPORTED DUE TO COLOR INTERFERENCE OF URINE PIGMENT   Ketones, ur   (*)    Value: TEST NOT REPORTED DUE TO COLOR INTERFERENCE OF URINE PIGMENT   Protein, ur   (*)    Value: TEST NOT REPORTED DUE TO COLOR INTERFERENCE OF URINE PIGMENT   Nitrite   (*)    Value: TEST NOT REPORTED DUE TO COLOR INTERFERENCE OF  URINE PIGMENT   Leukocytes,Ua   (*)    Value: TEST NOT REPORTED DUE TO COLOR INTERFERENCE OF URINE PIGMENT   All other components within normal limits  COMPREHENSIVE METABOLIC PANEL WITH GFR - Abnormal; Notable for the following components:   Glucose, Bld 316 (*)    All other components within normal limits  CBC WITH DIFFERENTIAL/PLATELET - Abnormal; Notable for the following components:   WBC 13.2 (*)    Neutro Abs 11.2 (*)    All other components within normal limits  URINALYSIS, MICROSCOPIC (REFLEX) - Abnormal; Notable for the following components:   Bacteria,  UA FEW (*)    All other components within normal limits  LIPASE, BLOOD    EKG: None  Radiology: CT HEMATURIA WORKUP Result Date: 09/26/2023 CLINICAL DATA:  Hematuria. EXAM: CT ABDOMEN AND PELVIS WITHOUT AND WITH CONTRAST TECHNIQUE: Multidetector CT imaging of the abdomen and pelvis was performed following the standard protocol before and following the bolus administration of intravenous contrast. RADIATION DOSE REDUCTION: This exam was performed according to the departmental dose-optimization program which includes automated exposure control, adjustment of the mA and/or kV according to patient size and/or use of iterative reconstruction technique. CONTRAST:  OMNIPAQUE  IOHEXOL  350 MG/ML SOLN COMPARISON:  Earlier CT dated 09/26/2023. FINDINGS: Lower chest: Minimal bibasilar linear atelectasis. No intra-abdominal free air or free fluid. Hepatobiliary: The liver is unremarkable. No biliary dilatation. Cholecystectomy. Pancreas: Unremarkable. No pancreatic ductal dilatation or surrounding inflammatory changes. Spleen: Normal in size without focal abnormality. Adrenals/Urinary Tract: The adrenal glands unremarkable. Nodular and irregular tissue in the left renal pelvis and collecting system as seen on the earlier CT may represent blood product but most concerning for a urothelial neoplasm. Direct visualization with scope recommended.  Evaluation of this soft tissue is limited as early arterial/nephrographic phase images are not provided. There is mild left hydronephrosis. Punctate nonobstructing right renal mid to lower pole calculus. There is mild right pelviectasis. There is diffuse thickening of the wall of the left ureter with surrounding stranding. Findings may be inflammatory/infectious in etiology or represent an infiltrative process. There is associated narrowing of the left ureter. The visualized right ureter appear unremarkable. The urinary bladder is mildly distended and grossly unremarkable. Stomach/Bowel: There is no bowel obstruction or active inflammation. The appendix is normal. Vascular/Lymphatic: Mild aortoiliac atherosclerotic disease. The IVC is unremarkable no portal venous gas. No adenopathy. Reproductive: Hysterectomy.  No suspicious adnexal masses. Other: None Musculoskeletal: Degenerative changes primarily at L3-L4. No acute osseous pathology. IMPRESSION: 1. Nodular and irregular tissue in the left renal pelvis and collecting system most concerning for a urothelial neoplasm. Urology consult advised. 2. Mild left hydronephrosis. 3. Diffuse thickening of the left ureter may be inflammatory/infectious in etiology or represent infiltrative process. 4. Punctate nonobstructing right renal mid to lower pole calculus. 5. No bowel obstruction. Normal appendix. 6.  Aortic Atherosclerosis (ICD10-I70.0). Electronically Signed   By: Vanetta Chou M.D.   On: 09/26/2023 22:50   CT Renal Stone Study Result Date: 09/26/2023 CLINICAL DATA:  Flank pain.  Concern for kidney stone. EXAM: CT ABDOMEN AND PELVIS WITHOUT CONTRAST TECHNIQUE: Multidetector CT imaging of the abdomen and pelvis was performed following the standard protocol without IV contrast. RADIATION DOSE REDUCTION: This exam was performed according to the departmental dose-optimization program which includes automated exposure control, adjustment of the mA and/or kV  according to patient size and/or use of iterative reconstruction technique. COMPARISON:  None Available. FINDINGS: Evaluation of this exam is limited in the absence of intravenous contrast. Lower chest: The visualized lung bases are clear. No intra-abdominal free air or free fluid. Hepatobiliary: The liver is unremarkable. No biliary dilatation. Cholecystectomy. Pancreas: Unremarkable. No pancreatic ductal dilatation or surrounding inflammatory changes. Spleen: Normal in size without focal abnormality. Adrenals/Urinary Tract: The adrenal glands unremarkable. There is ill-defined high attenuating mass in the left renal pelvis extending into the collecting systems. Although this may represent blood product/clot, findings most suspicious for a urothelial neoplasm. Urology consult and further evaluation with scope and direct visualization recommended. There is mild left hydronephrosis. No stone. There is a punctate nonobstructing right renal inferior pole  calculus. No hydronephrosis on the right. There is minimal asymmetric thickening of the left ureter with left periureteric stranding. The urinary bladder is minimally distended and suboptimally evaluated. Stomach/Bowel: There is no bowel obstruction or active inflammation. The appendix is normal. Vascular/Lymphatic: Mild aortoiliac atherosclerotic disease. The IVC is unremarkable. No portal venous gas. There is no adenopathy. Reproductive: Hysterectomy.  No suspicious adnexal masses Other: None Musculoskeletal: Degenerative changes of the spine. No acute osseous pathology. IMPRESSION: 1. 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 Urology consult and direct visualization with scope recommended. 2. Mild left hydronephrosis. No stone. 3. Punctate nonobstructing right renal inferior pole calculus. 4. No bowel obstruction. Normal appendix. 5.  Aortic Atherosclerosis (ICD10-I70.0). Electronically Signed   By: Vanetta Chou M.D.   On: 09/26/2023 19:43     Procedures   Medications Ordered in the ED  ondansetron  (ZOFRAN ) injection 4 mg (4 mg Intravenous Given 09/26/23 1845)  HYDROmorphone  (DILAUDID ) injection 1 mg (1 mg Intravenous Given 09/26/23 1853)                                    Medical Decision Making Patient here with sudden onset left flank pain associated with nausea vomiting and gross hematuria.  History of prior kidney stones current symptoms feel similar.  No reported injury, diarrhea fever or chills.  History of hysterectomy, denies any vaginal bleeding  I suspect kidney stone, pyelonephritis, pelvic process, neoplasm UTI all considered.  Amount and/or Complexity of Data Reviewed Labs: ordered.    Details: Labs show mild leukocytosis, chemistries blood sugar elevated 316, bicarb and ion gap unremarkable.  Kidney function unremarkable lipase also unremarkable, urinalysis shows red urine cloudy and remaining diagnostics not ordered due to interference with urine pigment.  0-5 white cells with few bacteria and no squamous cells reported  Repeat CBG improved, 232 Radiology: ordered.    Details: CT renal stone study shows findings concerning for urothelial neoplasm in the left renal pelvis and collecting system mild left hydro with no stone Discussion of management or test interpretation with external provider(s): Patient received IV antiemetic and pain medication, pain is much improved resting comfortably at this time.  Vital signs are reassuring.  Hemodynamically stable.  Will consult with urology  Discussed findings with urology, Dr. Cam who request patient have CT abdomen and pelvis with hematuria protocol  Dr. Cam has reviewed repeat CT imaging and discussed care plan with patient at bedside.  See his consultation note. Patient denies pain at this time.  No further nausea or vomiting.  Hemodynamically stable, will discharge home with short course of pain medication and  antiemetic and plan for close follow-up with urology with plan for diagnostic ureteroscopy.  She was given strict ER return precautions as well.        Risk Prescription drug management.        Final diagnoses:  Flank pain, acute  Gross hematuria    ED Discharge Orders     None          Herlinda Milling, PA-C 09/26/23 2356    Simon Lavonia SAILOR, MD 09/28/23 1029

## 2023-09-27 MED FILL — Oxycodone w/ Acetaminophen Tab 5-325 MG: ORAL | Qty: 6 | Status: AC

## 2023-10-02 ENCOUNTER — Other Ambulatory Visit: Payer: Self-pay | Admitting: Urology

## 2023-10-02 ENCOUNTER — Encounter (HOSPITAL_COMMUNITY): Payer: Self-pay | Admitting: Urology

## 2023-10-02 NOTE — Progress Notes (Signed)
 Sent message, via epic in basket, requesting orders in epic from Careers adviser.

## 2023-10-02 NOTE — Progress Notes (Signed)
 For Anesthesia: PCP - Silvio Ramp FNP at [facility] Cardiologist - Jayson Sierras, MD remote history 2020 Endocrinologist- Nida, Ethelle ORN, MD   Bowel Prep reminder: N/A  Chest x-ray - N/A EKG - greater than 1 year Stress Test - N/A ECHO - N/A Cardiac Cath - N/A Pacemaker/ICD device last checked: N/A Pacemaker orders received: N/A Device Rep notified: N/A  Spinal Cord Stimulator:N/A  Sleep Study - N/A CPAP - N/A  Fasting Blood Sugar - N/A Checks Blood Sugar __as needed___ times a day Date and result of last Hgb A1c-  Last dose of GLP1 agonist- N/A GLP1 instructions: Hold 7 days prior to schedule (Hold 24 hours-daily)   Last dose of SGLT-2 inhibitors- N/A SGLT-2 instructions: Hold 72 hours prior to surgery  Blood Thinner Instructions:N/A Aspirin Instructions:N/A Last Dose:N/A  Activity level: Can go up a flight of stairs and activities of daily living without stopping and without chest pain and/or shortness of breath       Anesthesia review: N/A  Patient denies shortness of breath, fever, cough and chest pain at PAT appointment   Patient verbalized understanding of instructions that were reviewed over the telephone.

## 2023-10-03 ENCOUNTER — Encounter (HOSPITAL_COMMUNITY): Payer: Self-pay | Admitting: Urology

## 2023-10-03 ENCOUNTER — Other Ambulatory Visit: Payer: Self-pay

## 2023-10-04 ENCOUNTER — Encounter (HOSPITAL_COMMUNITY): Payer: Self-pay | Admitting: Urology

## 2023-10-04 ENCOUNTER — Ambulatory Visit (HOSPITAL_COMMUNITY)

## 2023-10-04 ENCOUNTER — Encounter (HOSPITAL_COMMUNITY)
Admission: RE | Disposition: A | Discharge status: Home / Self Care | Payer: Self-pay | Source: Home / Self Care | Attending: Urology

## 2023-10-04 ENCOUNTER — Encounter (HOSPITAL_COMMUNITY): Payer: Self-pay | Admitting: Physician Assistant

## 2023-10-04 ENCOUNTER — Ambulatory Visit (HOSPITAL_COMMUNITY)
Admission: RE | Admit: 2023-10-04 | Discharge: 2023-10-04 | Disposition: A | Discharge status: Home / Self Care | Attending: Urology | Admitting: Urology

## 2023-10-04 ENCOUNTER — Other Ambulatory Visit: Payer: Self-pay

## 2023-10-04 ENCOUNTER — Ambulatory Visit (HOSPITAL_COMMUNITY): Payer: Self-pay | Admitting: Physician Assistant

## 2023-10-04 DIAGNOSIS — F319 Bipolar disorder, unspecified: Secondary | ICD-10-CM | POA: Diagnosis not present

## 2023-10-04 DIAGNOSIS — E66813 Obesity, class 3: Secondary | ICD-10-CM | POA: Insufficient documentation

## 2023-10-04 DIAGNOSIS — Z6841 Body Mass Index (BMI) 40.0 and over, adult: Secondary | ICD-10-CM | POA: Diagnosis not present

## 2023-10-04 DIAGNOSIS — Z7984 Long term (current) use of oral hypoglycemic drugs: Secondary | ICD-10-CM | POA: Insufficient documentation

## 2023-10-04 DIAGNOSIS — Z79899 Other long term (current) drug therapy: Secondary | ICD-10-CM | POA: Insufficient documentation

## 2023-10-04 DIAGNOSIS — E1165 Type 2 diabetes mellitus with hyperglycemia: Secondary | ICD-10-CM | POA: Insufficient documentation

## 2023-10-04 DIAGNOSIS — F419 Anxiety disorder, unspecified: Secondary | ICD-10-CM | POA: Insufficient documentation

## 2023-10-04 DIAGNOSIS — E119 Type 2 diabetes mellitus without complications: Secondary | ICD-10-CM

## 2023-10-04 DIAGNOSIS — R9341 Abnormal radiologic findings on diagnostic imaging of renal pelvis, ureter, or bladder: Secondary | ICD-10-CM | POA: Diagnosis not present

## 2023-10-04 DIAGNOSIS — R310 Gross hematuria: Secondary | ICD-10-CM | POA: Diagnosis present

## 2023-10-04 DIAGNOSIS — N1330 Unspecified hydronephrosis: Secondary | ICD-10-CM | POA: Diagnosis not present

## 2023-10-04 DIAGNOSIS — N059 Unspecified nephritic syndrome with unspecified morphologic changes: Secondary | ICD-10-CM | POA: Insufficient documentation

## 2023-10-04 DIAGNOSIS — K219 Gastro-esophageal reflux disease without esophagitis: Secondary | ICD-10-CM | POA: Diagnosis not present

## 2023-10-04 DIAGNOSIS — M1990 Unspecified osteoarthritis, unspecified site: Secondary | ICD-10-CM | POA: Diagnosis not present

## 2023-10-04 DIAGNOSIS — I10 Essential (primary) hypertension: Secondary | ICD-10-CM | POA: Diagnosis not present

## 2023-10-04 DIAGNOSIS — Z90710 Acquired absence of both cervix and uterus: Secondary | ICD-10-CM | POA: Diagnosis not present

## 2023-10-04 DIAGNOSIS — H9190 Unspecified hearing loss, unspecified ear: Secondary | ICD-10-CM | POA: Diagnosis not present

## 2023-10-04 DIAGNOSIS — Q6211 Congenital occlusion of ureteropelvic junction: Secondary | ICD-10-CM

## 2023-10-04 DIAGNOSIS — Z87891 Personal history of nicotine dependence: Secondary | ICD-10-CM | POA: Diagnosis not present

## 2023-10-04 HISTORY — DX: Insomnia, unspecified: G47.00

## 2023-10-04 HISTORY — DX: Personal history of other diseases of the nervous system and sense organs: Z86.69

## 2023-10-04 HISTORY — DX: Hyperlipidemia, unspecified: E78.5

## 2023-10-04 HISTORY — DX: Other complications of anesthesia, initial encounter: T88.59XA

## 2023-10-04 HISTORY — DX: Unspecified hearing loss, unspecified ear: H91.90

## 2023-10-04 HISTORY — DX: Anxiety disorder, unspecified: F41.9

## 2023-10-04 HISTORY — PX: CYSTOSCOPY WITH RETROGRADE PYELOGRAM, URETEROSCOPY AND STENT PLACEMENT: SHX5789

## 2023-10-04 LAB — GLUCOSE, CAPILLARY: Glucose-Capillary: 158 mg/dL — ABNORMAL HIGH (ref 70–99)

## 2023-10-04 SURGERY — CYSTOURETEROSCOPY, WITH RETROGRADE PYELOGRAM AND STENT INSERTION
Anesthesia: General | Site: Ureter | Laterality: Left

## 2023-10-04 MED ORDER — ORAL CARE MOUTH RINSE: 15.0000 mL | Freq: Once | OROMUCOSAL | Status: AC

## 2023-10-04 MED ORDER — TRAMADOL HCL 50 MG PO TABS: 50.0000 mg | ORAL_TABLET | Freq: Four times a day (QID) | ORAL | 0 refills | Status: AC | PRN

## 2023-10-04 MED ORDER — LACTATED RINGERS IV SOLN: INTRAVENOUS | Status: DC

## 2023-10-04 MED ORDER — MIDAZOLAM HCL 2 MG/2ML IJ SOLN
INTRAMUSCULAR | Status: AC
  Filled 2023-10-04: qty 2

## 2023-10-04 MED ORDER — SODIUM CHLORIDE 0.9 % IR SOLN
Status: DC | PRN
  Administered 2023-10-04: 3000 mL

## 2023-10-04 MED ORDER — INSULIN ASPART 100 UNIT/ML IJ SOLN: 0.0000 [IU] | INTRAMUSCULAR | Status: DC | PRN

## 2023-10-04 MED ORDER — SODIUM CHLORIDE 0.9 % IV SOLN: 12.5000 mg | INTRAVENOUS | Status: DC | PRN

## 2023-10-04 MED ORDER — DEXAMETHASONE SODIUM PHOSPHATE 10 MG/ML IJ SOLN
INTRAMUSCULAR | Status: DC | PRN
  Administered 2023-10-04: 10 mg via INTRAVENOUS

## 2023-10-04 MED ORDER — FENTANYL CITRATE (PF) 250 MCG/5ML IJ SOLN
INTRAMUSCULAR | Status: DC | PRN
  Administered 2023-10-04: 25 ug via INTRAVENOUS
  Administered 2023-10-04: 50 ug via INTRAVENOUS
  Administered 2023-10-04: 25 ug via INTRAVENOUS

## 2023-10-04 MED ORDER — IOHEXOL 300 MG/ML  SOLN
INTRAMUSCULAR | Status: DC | PRN
  Administered 2023-10-04: 10 mL

## 2023-10-04 MED ORDER — CHLORHEXIDINE GLUCONATE 0.12 % MT SOLN
15.0000 mL | Freq: Once | OROMUCOSAL | Status: AC
  Administered 2023-10-04: 15 mL via OROMUCOSAL

## 2023-10-04 MED ORDER — CEFAZOLIN SODIUM-DEXTROSE 2-3 GM-%(50ML) IV SOLR
INTRAVENOUS | Status: DC | PRN
  Administered 2023-10-04: 2 g via INTRAVENOUS

## 2023-10-04 MED ORDER — PROPOFOL 10 MG/ML IV BOLUS
INTRAVENOUS | Status: DC | PRN
  Administered 2023-10-04: 200 mg via INTRAVENOUS

## 2023-10-04 MED ORDER — FENTANYL CITRATE (PF) 100 MCG/2ML IJ SOLN
INTRAMUSCULAR | Status: AC
  Filled 2023-10-04: qty 2

## 2023-10-04 MED ORDER — ONDANSETRON HCL 4 MG/2ML IJ SOLN
INTRAMUSCULAR | Status: DC | PRN
Start: 2023-10-04 — End: 2023-10-04
  Administered 2023-10-04: 4 mg via INTRAVENOUS

## 2023-10-04 MED ORDER — CIPROFLOXACIN HCL 500 MG PO TABS: 500.0000 mg | ORAL_TABLET | Freq: Once | ORAL | 0 refills | Status: AC

## 2023-10-04 MED ORDER — OXYCODONE HCL 5 MG PO TABS: 5.0000 mg | ORAL_TABLET | Freq: Once | ORAL | Status: DC | PRN

## 2023-10-04 MED ORDER — MIDAZOLAM HCL 2 MG/2ML IJ SOLN
INTRAMUSCULAR | Status: DC | PRN
  Administered 2023-10-04: 2 mg via INTRAVENOUS

## 2023-10-04 MED ORDER — OXYCODONE HCL 5 MG/5ML PO SOLN: 5.0000 mg | Freq: Once | ORAL | Status: DC | PRN

## 2023-10-04 MED ORDER — INSULIN ASPART 100 UNIT/ML IJ SOLN
INTRAMUSCULAR | Status: AC
  Administered 2023-10-04: 2 [IU] via SUBCUTANEOUS
  Filled 2023-10-04: qty 1

## 2023-10-04 MED ORDER — LIDOCAINE 2% (20 MG/ML) 5 ML SYRINGE
INTRAMUSCULAR | Status: DC | PRN
  Administered 2023-10-04: 100 mg via INTRAVENOUS

## 2023-10-04 MED ORDER — PHENAZOPYRIDINE HCL 200 MG PO TABS: 200.0000 mg | ORAL_TABLET | Freq: Three times a day (TID) | ORAL | 0 refills | Status: AC | PRN

## 2023-10-04 MED ORDER — FENTANYL CITRATE PF 50 MCG/ML IJ SOSY: 25.0000 ug | PREFILLED_SYRINGE | INTRAMUSCULAR | Status: DC | PRN

## 2023-10-04 MED ORDER — PROPOFOL 10 MG/ML IV BOLUS
INTRAVENOUS | Status: AC
  Filled 2023-10-04: qty 20

## 2023-10-04 MED ORDER — ACETAMINOPHEN 500 MG PO TABS
1000.0000 mg | ORAL_TABLET | Freq: Once | ORAL | Status: AC
  Administered 2023-10-04: 1000 mg via ORAL
  Filled 2023-10-04: qty 2

## 2023-10-04 MED ORDER — CEFAZOLIN SODIUM 1 G IJ SOLR
INTRAMUSCULAR | Status: AC
Start: 2023-10-04 — End: 2023-10-04
  Filled 2023-10-04: qty 20

## 2023-10-04 MED ORDER — AMISULPRIDE (ANTIEMETIC) 5 MG/2ML IV SOLN: 10.0000 mg | Freq: Once | INTRAVENOUS | Status: DC | PRN

## 2023-10-04 SURGICAL SUPPLY — 22 items
BAG URO CATCHER STRL LF (MISCELLANEOUS) ×2 IMPLANT
BASKET ZERO TIP NITINOL 2.4FR (BASKET) IMPLANT
CATH URETL OPEN 5X70 (CATHETERS) ×2 IMPLANT
CLOTH BEACON ORANGE TIMEOUT ST (SAFETY) ×2 IMPLANT
DRAPE FOOT SWITCH (DRAPES) ×2 IMPLANT
ELECT REM PT RETURN 15FT ADLT (MISCELLANEOUS) ×2 IMPLANT
GLOVE SURG LX STRL 7.5 STRW (GLOVE) ×2 IMPLANT
GOWN STRL REUS W/ TWL XL LVL3 (GOWN DISPOSABLE) ×2 IMPLANT
GUIDEWIRE ANG ZIPWIRE 038X150 (WIRE) IMPLANT
GUIDEWIRE STR DUAL SENSOR (WIRE) ×2 IMPLANT
KIT TURNOVER KIT A (KITS) ×2 IMPLANT
LOOP CUT BIPOLAR 24F LRG (ELECTROSURGICAL) IMPLANT
MANIFOLD NEPTUNE II (INSTRUMENTS) ×2 IMPLANT
NDL SAFETY ECLIPSE 18X1.5 (NEEDLE) ×2 IMPLANT
PACK CYSTO (CUSTOM PROCEDURE TRAY) ×2 IMPLANT
SHEATH NAVIGATOR HD 11/13X28 (SHEATH) IMPLANT
SHEATH NAVIGATOR HD 11/13X36 (SHEATH) IMPLANT
STENT URET 6FRX24 CONTOUR (STENTS) ×1 IMPLANT
SYRINGE TOOMEY IRRIG 70ML (MISCELLANEOUS) IMPLANT
TRACTIP FLEXIVA PULS ID 200XHI (Laser) IMPLANT
TUBING CONNECTING 10 (TUBING) ×2 IMPLANT
TUBING UROLOGY SET (TUBING) ×2 IMPLANT

## 2023-10-04 NOTE — Anesthesia Procedure Notes (Signed)
 Procedure Name: LMA Insertion Date/Time: 10/04/2023 2:39 PM  Performed by: Cena Epps, CRNAPre-anesthesia Checklist: Patient identified, Emergency Drugs available, Suction available and Patient being monitored Patient Re-evaluated:Patient Re-evaluated prior to induction Oxygen Delivery Method: Circle System Utilized Preoxygenation: Pre-oxygenation with 100% oxygen Induction Type: IV induction Ventilation: Mask ventilation without difficulty LMA: LMA inserted LMA Size: 4.0 Number of attempts: 1 Airway Equipment and Method: Bite block Placement Confirmation: positive ETCO2 Tube secured with: Tape Dental Injury: Teeth and Oropharynx as per pre-operative assessment

## 2023-10-04 NOTE — Anesthesia Postprocedure Evaluation (Signed)
 Anesthesia Post Note  Patient: Robin Martin  Procedure(s) Performed: DIAGNOSTIC CYSTOURETEROSCOPY, WITH RETROGRADE PYELOGRAM AND STENT INSERTION (Left: Ureter)     Patient location during evaluation: PACU Anesthesia Type: General Level of consciousness: awake and alert Pain management: pain level controlled Vital Signs Assessment: post-procedure vital signs reviewed and stable Respiratory status: spontaneous breathing, nonlabored ventilation and respiratory function stable Cardiovascular status: stable and blood pressure returned to baseline Anesthetic complications: no   No notable events documented.  Last Vitals:  Vitals:   10/04/23 1545 10/04/23 1600  BP: 137/89 125/80  Pulse: 70 81  Resp: 16 20  Temp:  (!) 36.1 C  SpO2: 92% 96%    Last Pain:  Vitals:   10/04/23 1600  TempSrc:   PainSc: 0-No pain                 Debby FORBES Like

## 2023-10-04 NOTE — Transfer of Care (Signed)
 Immediate Anesthesia Transfer of Care Note  Patient: Robin Martin  Procedure(s) Performed: DIAGNOSTIC CYSTOURETEROSCOPY, WITH RETROGRADE PYELOGRAM AND STENT INSERTION (Left: Ureter)  Patient Location: PACU  Anesthesia Type:General  Level of Consciousness: drowsy and patient cooperative  Airway & Oxygen Therapy: Patient Spontanous Breathing and Patient connected to face mask oxygen  Post-op Assessment: Report given to RN and Post -op Vital signs reviewed and stable  Post vital signs: Reviewed and stable  Last Vitals:  Vitals Value Taken Time  BP 132/64 10/04/23 15:17  Temp    Pulse 94 10/04/23 15:20  Resp 17 10/04/23 15:20  SpO2 97 % 10/04/23 15:20  Vitals shown include unfiled device data.  Last Pain:  Vitals:   10/04/23 1232  TempSrc:   PainSc: 0-No pain      Patients Stated Pain Goal: 3 (10/04/23 1232)  Complications: No notable events documented.

## 2023-10-04 NOTE — Anesthesia Preprocedure Evaluation (Addendum)
 Anesthesia Evaluation  Patient identified by MRN, date of birth, ID band Patient awake    Reviewed: Allergy & Precautions, NPO status , Patient's Chart, lab work & pertinent test results, reviewed documented beta blocker date and time   Airway Mallampati: II  TM Distance: >3 FB Neck ROM: Full    Dental  (+) Dental Advisory Given, Teeth Intact   Pulmonary former smoker   Pulmonary exam normal        Cardiovascular hypertension, Pt. on medications Normal cardiovascular exam     Neuro/Psych  PSYCHIATRIC DISORDERS Anxiety Depression Bipolar Disorder    Hard of hearing Visual difficulties   Neuromuscular disease    GI/Hepatic Neg liver ROS,GERD  Controlled and Medicated,, Biliary dyskinesia    Endo/Other  diabetes, Poorly Controlled, Type 2, Oral Hypoglycemic Agents  Class 3 obesity  Renal/GU      Musculoskeletal  (+) Arthritis , Osteoarthritis,   Scoliosis    Abdominal  (+) + obese  Peds  Hematology hct 41.6, plt 240   Anesthesia Other Findings HSV  Reproductive/Obstetrics  S/p hysterectomy                               Anesthesia Physical Anesthesia Plan  ASA: 3  Anesthesia Plan: General   Post-op Pain Management: Tylenol  PO (pre-op)*   Induction: Intravenous  PONV Risk Score and Plan: 4 or greater and Ondansetron , Dexamethasone , Midazolam  and Treatment may vary due to age or medical condition  Airway Management Planned: LMA  Additional Equipment: None  Intra-op Plan:   Post-operative Plan: Extubation in OR  Informed Consent: I have reviewed the patients History and Physical, chart, labs and discussed the procedure including the risks, benefits and alternatives for the proposed anesthesia with the patient or authorized representative who has indicated his/her understanding and acceptance.     Dental advisory given  Plan Discussed with: CRNA and  Anesthesiologist  Anesthesia Plan Comments:          Anesthesia Quick Evaluation

## 2023-10-04 NOTE — Op Note (Signed)
 Preoperative diagnosis:  Gross hematuria, left hydronephrosis and a large filling defect  Postoperative diagnosis:  Same  Procedure: Cystoscopy, left retrograde pyelogram with interpretation Diagnostic left ureteroscopy Left ureteral stent exchange  Surgeon: Morene MICAEL Salines, MD  Anesthesia: General  Complications: None  Intraoperative findings:  #1: The patient's retrograde pyelogram demonstrated normal caliber ureter with no filling defect or abnormality.  The renal pelvis was dilated and with significant hydronephrosis.  There were no other appreciable filling defects, there was a small narrowing at the UPJ. # 2: Ureteral exploration demonstrated no tumors or renal abnormalities.  There was some old clot within the renal pelvis, but there was no significant narrowing of the UPJ.  The urothelium had some petechiae and erythema, but no significant tumors or renal abnormalities.  No biopsies were performed. #3: A 24 cm x 6 French double-J stent was placed in the left ureter.   EBL: Minimal  Specimens: Urine cytology of the left renal pelvis  Indication: Robin Martin is a 43 y.o. patient with gross hematuria and severe pain who presented to the emergency room and a CT scan demonstrated hydronephrosis of the left kidney with filling defect and concern for tumor versus clot.  After reviewing the management options for treatment, he elected to proceed with the above surgical procedure(s). We have discussed the potential benefits and risks of the procedure, side effects of the proposed treatment, the likelihood of the patient achieving the goals of the procedure, and any potential problems that might occur during the procedure or recuperation. Informed consent has been obtained.  Description of procedure:  Consent was obtained the preoperative holding area.  She was brought back to the operating room placed on table in supine position.  General esthesia was then induced endotracheal  tube was inserted.  A timeout was performed.  She is placed in the dorsolithotomy position and prepped and draped in the routine sterile fashion.  21 French degree cystoscope was gently passed through the patient's urethra under visual guidance.  Cystoscopy was performed demonstrating slightly erythematous urothelium, but no filling defects or other abnormalities.  The ureteral orifice ease were orthotopic.  I then advanced a 5 [place] open-ended catheter into the left ureteral orifice and with 10 cc of Omnipaque  contrast performed a left retrograde pyelogram with the above findings.  I then advanced a wire up through the open-ended catheter and up into the left renal pelvis remove the catheter over the wire.  The bladder was subsequently drained.  I then advanced a single-lumen flexible ureteroscope through the patient's urethra and into the bladder.  I was able to cannulate the left ureter and advanced it up into the left renal pelvis under visual guidance.  The ureter was noted to be completely normal.  The UPJ was slightly narrowed, but easily passed with the scope.  Once into the renal pelvis I irrigated out the old urine and residual clot and performed cytology.  I then slowly filled the renal pelvis and performed pyeloscopy using fluoroscopic guidance.  There was no other abnormalities.  I slowly backed out the ureteroscope reinspecting the ureter noting no significant ureteral abnormalities or trauma.  I then advanced a 24 cm x 6 French double-J stent over the wire and into the left renal pelvis.  Once it was noted to be well within the left renal pelvis I advanced the stent to the urethral meatus and with some back pressure was able to remove the wire leaving stent behind.  I then advanced the  beak of the cystoscope into the patient's bladder to ensure that the distal end of the stent was in the patient's bladder.  The stent tether was brought through the urethra and tucked into the patient's  vagina.  The patient was subsequently extubated and returned to PACU in stable condition.  Disposition: The patient is being discharged home and instructed to remove her stent on Monday, September 15.

## 2023-10-04 NOTE — Interval H&P Note (Signed)
 History and Physical Interval Note:  10/04/2023 1:00 PM  Robin Martin  has presented today for surgery, with the diagnosis of GROSS HEMATURIA.  The various methods of treatment have been discussed with the patient and family. After consideration of risks, benefits and other options for treatment, the patient has consented to  Procedure(s) with comments: CYSTOURETEROSCOPY, WITH RETROGRADE PYELOGRAM AND STENT INSERTION (Left) - LEFT DIAGNOSTIC URETEROSCOPY, POSSIBLE RENAL PELVIS BIOPSY, LEFT RETROGRADE PYELOGRAM, AND URETERAL STENT PLACEMENT CYSTOSCOPY, WITH BIOPSY (Left) as a surgical intervention.  The patient's history has been reviewed, patient examined, no change in status, stable for surgery.  I have reviewed the patient's chart and labs.  Questions were answered to the patient's satisfaction.     Morene LELON Salines

## 2023-10-04 NOTE — Discharge Instructions (Signed)
 DISCHARGE INSTRUCTIONS FOR KIDNEY STONE/URETERAL STENT   MEDICATIONS:  1. Resume all your other meds from home - except do not take any extra narcotic pain meds that you may have at home.  2. Pyridium  is to help with the burning/stinging when you urinate. 3. Tramadol  is for moderate/severe pain, otherwise taking upto 1000 mg every 6 hours of plainTylenol will help treat your pain.   4. Take Cipro  one hour prior to removal of your stent.   ACTIVITY:  1. No strenuous activity x 1week  2. No driving while on narcotic pain medications  3. Drink plenty of water   4. Continue to walk at home - you can still get blood clots when you are at home, so keep active, but don't over do it.  5. May return to work/school tomorrow or when you feel ready   BATHING:  1. You can shower and we recommend daily showers  2. You have a string coming from your urethra: The stent string is attached to your ureteral stent. Do not pull on this.   SIGNS/SYMPTOMS TO CALL:  Please call us  if you have a fever greater than 101.5, uncontrolled nausea/vomiting, uncontrolled pain, dizziness, unable to urinate, bloody urine, chest pain, shortness of breath, leg swelling, leg pain, redness around wound, drainage from wound, or any other concerns or questions.   You can reach us  at [phone].   FOLLOW-UP:  1. You have an appointment in 2 weeks for f/u with Dr. Cam. 2. You have a string attached to your stent, you may remove it on Monday, Sept 15th. To do this, pull the strings until the stents are completely removed. You may feel an odd sensation in your back.

## 2023-10-05 ENCOUNTER — Encounter (HOSPITAL_COMMUNITY): Payer: Self-pay | Admitting: Urology

## 2023-10-06 LAB — CYTOLOGY - NON PAP

## 2024-03-08 ENCOUNTER — Ambulatory Visit (INDEPENDENT_AMBULATORY_CARE_PROVIDER_SITE_OTHER): Admitting: Otolaryngology
